# Patient Record
Sex: Male | Born: 1955 | Race: White | Hispanic: No | Marital: Single | Smoking: Never smoker
Health system: Southern US, Community
[De-identification: ages and names within clinical notes are randomized; demographics above are authoritative.]

## PROBLEM LIST (undated history)

## (undated) DIAGNOSIS — N390 Urinary tract infection, site not specified: Secondary | ICD-10-CM

## (undated) DIAGNOSIS — T7840XA Allergy, unspecified, initial encounter: Secondary | ICD-10-CM

## (undated) DIAGNOSIS — Q909 Down syndrome, unspecified: Secondary | ICD-10-CM

## (undated) DIAGNOSIS — K219 Gastro-esophageal reflux disease without esophagitis: Secondary | ICD-10-CM

## (undated) DIAGNOSIS — F0390 Unspecified dementia without behavioral disturbance: Secondary | ICD-10-CM

## (undated) DIAGNOSIS — M810 Age-related osteoporosis without current pathological fracture: Secondary | ICD-10-CM

## (undated) DIAGNOSIS — R569 Unspecified convulsions: Secondary | ICD-10-CM

## (undated) DIAGNOSIS — M1990 Unspecified osteoarthritis, unspecified site: Secondary | ICD-10-CM

## (undated) DIAGNOSIS — R634 Abnormal weight loss: Secondary | ICD-10-CM

## (undated) DIAGNOSIS — D649 Anemia, unspecified: Secondary | ICD-10-CM

## (undated) HISTORY — DX: Urinary tract infection, site not specified: N39.0

## (undated) HISTORY — DX: Unspecified osteoarthritis, unspecified site: M19.90

## (undated) HISTORY — PX: HERNIA REPAIR: SHX51

## (undated) HISTORY — DX: Allergy, unspecified, initial encounter: T78.40XA

## (undated) HISTORY — DX: Unspecified convulsions: R56.9

## (undated) HISTORY — PX: CARPAL TUNNEL RELEASE: SHX101

## (undated) HISTORY — DX: Abnormal weight loss: R63.4

## (undated) HISTORY — DX: Anemia, unspecified: D64.9

---

## 2005-05-17 ENCOUNTER — Ambulatory Visit (HOSPITAL_BASED_OUTPATIENT_CLINIC_OR_DEPARTMENT_OTHER): Admission: RE | Admit: 2005-05-17 | Discharge: 2005-05-17 | Payer: Self-pay | Admitting: Orthopedic Surgery

## 2005-05-17 ENCOUNTER — Ambulatory Visit (HOSPITAL_COMMUNITY): Admission: RE | Admit: 2005-05-17 | Discharge: 2005-05-17 | Payer: Self-pay | Admitting: Orthopedic Surgery

## 2006-11-22 ENCOUNTER — Inpatient Hospital Stay: Payer: Self-pay | Admitting: Internal Medicine

## 2006-11-24 ENCOUNTER — Ambulatory Visit: Payer: Self-pay | Admitting: Internal Medicine

## 2006-12-10 ENCOUNTER — Ambulatory Visit: Payer: Self-pay | Admitting: Internal Medicine

## 2007-03-04 ENCOUNTER — Ambulatory Visit: Payer: Self-pay | Admitting: Emergency Medicine

## 2007-03-14 ENCOUNTER — Emergency Department: Payer: Self-pay | Admitting: Emergency Medicine

## 2007-03-14 ENCOUNTER — Other Ambulatory Visit: Payer: Self-pay

## 2007-03-21 ENCOUNTER — Emergency Department: Payer: Self-pay | Admitting: Emergency Medicine

## 2008-05-08 ENCOUNTER — Emergency Department: Payer: Self-pay | Admitting: Emergency Medicine

## 2010-04-29 DIAGNOSIS — M810 Age-related osteoporosis without current pathological fracture: Secondary | ICD-10-CM | POA: Insufficient documentation

## 2011-04-26 DIAGNOSIS — B351 Tinea unguium: Secondary | ICD-10-CM | POA: Insufficient documentation

## 2011-04-26 DIAGNOSIS — L84 Corns and callosities: Secondary | ICD-10-CM | POA: Insufficient documentation

## 2011-06-10 ENCOUNTER — Ambulatory Visit: Payer: Self-pay | Admitting: Internal Medicine

## 2011-06-17 ENCOUNTER — Ambulatory Visit: Payer: Self-pay

## 2011-06-18 ENCOUNTER — Ambulatory Visit: Payer: Self-pay

## 2011-06-18 ENCOUNTER — Emergency Department: Payer: Self-pay | Admitting: Emergency Medicine

## 2011-06-21 DIAGNOSIS — L02419 Cutaneous abscess of limb, unspecified: Secondary | ICD-10-CM | POA: Insufficient documentation

## 2012-02-02 ENCOUNTER — Inpatient Hospital Stay: Payer: Self-pay | Admitting: Internal Medicine

## 2012-02-02 LAB — CBC WITH DIFFERENTIAL/PLATELET
Basophil #: 0.1 10*3/uL (ref 0.0–0.1)
Basophil %: 1.5 %
Eosinophil #: 0.1 10*3/uL (ref 0.0–0.7)
HCT: 39.7 % — ABNORMAL LOW (ref 40.0–52.0)
HGB: 13.3 g/dL (ref 13.0–18.0)
Lymphocyte %: 12.5 %
MCH: 34.8 pg — ABNORMAL HIGH (ref 26.0–34.0)
MCHC: 33.6 g/dL (ref 32.0–36.0)
Monocyte #: 0.4 x10 3/mm (ref 0.2–1.0)
Monocyte %: 6.6 %
Neutrophil #: 4.8 10*3/uL (ref 1.4–6.5)
Neutrophil %: 77.9 %
RBC: 3.83 10*6/uL — ABNORMAL LOW (ref 4.40–5.90)
RDW: 14.6 % — ABNORMAL HIGH (ref 11.5–14.5)
WBC: 6.1 10*3/uL (ref 3.8–10.6)

## 2012-02-02 LAB — COMPREHENSIVE METABOLIC PANEL
Albumin: 2.8 g/dL — ABNORMAL LOW (ref 3.4–5.0)
Alkaline Phosphatase: 78 U/L (ref 50–136)
Anion Gap: 10 (ref 7–16)
BUN: 11 mg/dL (ref 7–18)
Calcium, Total: 8.1 mg/dL — ABNORMAL LOW (ref 8.5–10.1)
Creatinine: 0.9 mg/dL (ref 0.60–1.30)
Glucose: 101 mg/dL — ABNORMAL HIGH (ref 65–99)
Potassium: 3.8 mmol/L (ref 3.5–5.1)
SGOT(AST): 30 U/L (ref 15–37)
Total Protein: 6.6 g/dL (ref 6.4–8.2)

## 2012-02-02 LAB — CK TOTAL AND CKMB (NOT AT ARMC)
CK, Total: 101 U/L (ref 35–232)
CK, Total: 45 U/L (ref 35–232)

## 2012-02-02 LAB — URINALYSIS, COMPLETE
Bilirubin,UR: NEGATIVE
Blood: NEGATIVE
Nitrite: NEGATIVE
Ph: 8 (ref 4.5–8.0)
Specific Gravity: 1.016 (ref 1.003–1.030)
Squamous Epithelial: NONE SEEN

## 2012-02-02 LAB — TROPONIN I: Troponin-I: <0.02 ng/mL

## 2012-02-02 LAB — TSH: Thyroid Stimulating Horm: 5.14 u[IU]/mL — ABNORMAL HIGH

## 2012-02-02 LAB — ETHANOL
Ethanol %: <0.003 % (ref 0.000–0.080)
Ethanol: <3 mg/dL

## 2012-02-03 LAB — CBC WITH DIFFERENTIAL/PLATELET
Basophil #: 0.1 10*3/uL (ref 0.0–0.1)
Basophil %: 0.6 %
Eosinophil #: 0 10*3/uL (ref 0.0–0.7)
Eosinophil %: 0 %
MCH: 35.4 pg — ABNORMAL HIGH (ref 26.0–34.0)
MCV: 102 fL — ABNORMAL HIGH (ref 80–100)
Monocyte #: 0.9 x10 3/mm (ref 0.2–1.0)
Neutrophil %: 84.9 %
Platelet: 284 10*3/uL (ref 150–440)
RBC: 4.04 10*6/uL — ABNORMAL LOW (ref 4.40–5.90)
RDW: 14.5 % (ref 11.5–14.5)

## 2012-02-03 LAB — COMPREHENSIVE METABOLIC PANEL
Albumin: 2.7 g/dL — ABNORMAL LOW (ref 3.4–5.0)
Alkaline Phosphatase: 95 U/L (ref 50–136)
BUN: 15 mg/dL (ref 7–18)
Bilirubin,Total: 0.4 mg/dL (ref 0.2–1.0)
Calcium, Total: 7.5 mg/dL — ABNORMAL LOW (ref 8.5–10.1)
Co2: 19 mmol/L — ABNORMAL LOW (ref 21–32)
Creatinine: 0.81 mg/dL (ref 0.60–1.30)
EGFR (Non-African Amer.): >60
SGOT(AST): 36 U/L (ref 15–37)
SGPT (ALT): 24 U/L

## 2012-02-03 LAB — CARBAMAZEPINE LEVEL, TOTAL: Carbamazepine: 6 ug/mL

## 2012-02-03 LAB — TROPONIN I: Troponin-I: 0.03 ng/mL

## 2012-02-03 LAB — MAGNESIUM: Magnesium: 1.7 mg/dL — ABNORMAL LOW

## 2012-02-03 LAB — URINE CULTURE

## 2012-02-04 LAB — BASIC METABOLIC PANEL
Anion Gap: 10 (ref 7–16)
Calcium, Total: 7.5 mg/dL — ABNORMAL LOW (ref 8.5–10.1)
Co2: 16 mmol/L — ABNORMAL LOW (ref 21–32)
Creatinine: 0.66 mg/dL (ref 0.60–1.30)
EGFR (African American): >60
Glucose: 112 mg/dL — ABNORMAL HIGH (ref 65–99)
Osmolality: 278 (ref 275–301)
Sodium: 139 mmol/L (ref 136–145)

## 2012-02-04 LAB — CBC WITH DIFFERENTIAL/PLATELET
Basophil #: 0.1 10*3/uL (ref 0.0–0.1)
Basophil %: 0.7 %
Eosinophil %: 0.2 %
Lymphocyte #: 0.9 10*3/uL — ABNORMAL LOW (ref 1.0–3.6)
Lymphocyte %: 9.1 %
MCH: 35.3 pg — ABNORMAL HIGH (ref 26.0–34.0)
MCV: 101 fL — ABNORMAL HIGH (ref 80–100)
Monocyte #: 0.7 x10 3/mm (ref 0.2–1.0)
Neutrophil %: 82.6 %
Platelet: 240 10*3/uL (ref 150–440)
RBC: 3.73 10*6/uL — ABNORMAL LOW (ref 4.40–5.90)
RDW: 14.1 % (ref 11.5–14.5)
WBC: 9.4 10*3/uL (ref 3.8–10.6)

## 2012-02-08 LAB — CULTURE, BLOOD (SINGLE)

## 2012-05-31 DIAGNOSIS — R1310 Dysphagia, unspecified: Secondary | ICD-10-CM | POA: Insufficient documentation

## 2012-11-13 LAB — COMPREHENSIVE METABOLIC PANEL
Anion Gap: 5 — ABNORMAL LOW (ref 7–16)
BUN: 15 mg/dL (ref 7–18)
Bilirubin,Total: 0.4 mg/dL (ref 0.2–1.0)
Calcium, Total: 8.7 mg/dL (ref 8.5–10.1)
Chloride: 103 mmol/L (ref 98–107)
Co2: 29 mmol/L (ref 21–32)
Creatinine: 0.86 mg/dL (ref 0.60–1.30)
EGFR (African American): >60
EGFR (Non-African Amer.): >60
Osmolality: 277 (ref 275–301)
Potassium: 3.9 mmol/L (ref 3.5–5.1)
SGPT (ALT): 19 U/L (ref 12–78)
Total Protein: 7.2 g/dL (ref 6.4–8.2)

## 2012-11-13 LAB — TROPONIN I: Troponin-I: <0.02 ng/mL

## 2012-11-13 LAB — CBC WITH DIFFERENTIAL/PLATELET
Basophil #: 0 x10 3/mm 3
Basophil %: 0.1 %
Eosinophil #: 0 x10 3/mm 3
Eosinophil %: 0.3 %
HCT: 40.7 %
HGB: 14 g/dL
Lymphocyte %: 2.7 %
Lymphs Abs: 0.2 x10 3/mm 3 — ABNORMAL LOW
MCH: 34.1 pg — ABNORMAL HIGH
MCHC: 34.3 g/dL
MCV: 100 fL
Monocyte #: 0.1 "x10 3/mm " — ABNORMAL LOW
Monocyte %: 1.5 %
Neutrophil #: 7.9 x10 3/mm 3 — ABNORMAL HIGH
Neutrophil %: 95.4 %
Platelet: 242 x10 3/mm 3
RBC: 4.09 X10 6/mm 3
RDW: 14.1 %
WBC: 8.2 x10 3/mm 3

## 2012-11-13 LAB — CK TOTAL AND CKMB (NOT AT ARMC)
CK, Total: 142 U/L (ref 21–215)
CK-MB: 2 ng/mL (ref 0.5–3.6)

## 2012-11-13 LAB — PROTIME-INR: INR: 1

## 2012-11-14 ENCOUNTER — Observation Stay: Payer: Self-pay | Admitting: Internal Medicine

## 2012-11-14 LAB — CK TOTAL AND CKMB (NOT AT ARMC)
CK, Total: 229 U/L — ABNORMAL HIGH (ref 21–215)
CK-MB: 2.9 ng/mL (ref 0.5–3.6)
CK-MB: 2.4 ng/mL (ref 0.5–3.6)

## 2012-11-14 LAB — URINALYSIS, COMPLETE
Bacteria: NONE SEEN
Bilirubin,UR: NEGATIVE
Blood: NEGATIVE
Glucose,UR: NEGATIVE mg/dL (ref 0–75)
Nitrite: NEGATIVE
Ph: 6 (ref 4.5–8.0)
Protein: NEGATIVE
RBC,UR: <1 /HPF (ref 0–5)
Specific Gravity: 1.018 (ref 1.003–1.030)
Squamous Epithelial: NONE SEEN

## 2012-11-14 LAB — TROPONIN I: Troponin-I: 0.02 ng/mL

## 2012-11-15 LAB — URINE CULTURE

## 2013-01-28 DIAGNOSIS — K219 Gastro-esophageal reflux disease without esophagitis: Secondary | ICD-10-CM | POA: Insufficient documentation

## 2013-02-28 ENCOUNTER — Ambulatory Visit: Payer: Self-pay | Admitting: Family Medicine

## 2013-02-28 ENCOUNTER — Emergency Department: Payer: Self-pay | Admitting: Emergency Medicine

## 2013-03-05 DIAGNOSIS — S0292XA Unspecified fracture of facial bones, initial encounter for closed fracture: Secondary | ICD-10-CM | POA: Insufficient documentation

## 2013-11-28 DIAGNOSIS — L03113 Cellulitis of right upper limb: Secondary | ICD-10-CM | POA: Insufficient documentation

## 2013-12-18 DIAGNOSIS — K4090 Unilateral inguinal hernia, without obstruction or gangrene, not specified as recurrent: Secondary | ICD-10-CM | POA: Insufficient documentation

## 2014-02-16 DIAGNOSIS — K920 Hematemesis: Secondary | ICD-10-CM | POA: Insufficient documentation

## 2014-03-10 DIAGNOSIS — K2100 Gastro-esophageal reflux disease with esophagitis, without bleeding: Secondary | ICD-10-CM | POA: Insufficient documentation

## 2014-05-10 ENCOUNTER — Emergency Department: Payer: Self-pay | Admitting: Emergency Medicine

## 2014-10-28 NOTE — Discharge Summary (Signed)
PATIENT NAME:  Jesus FlackANCE, TERRY M MR#:  [id number] DATE OF BIRTH:  11/01/1955  DATE OF ADMISSION:  02/02/2012 DATE OF DISCHARGE:  02/05/2012  DISCHARGE DIAGNOSES: 1. Beta blocker overdose with bradycardia and hypotension, resolved.  2. Accidental ingestion of multiple medications including Tegretol, Topamax, Lamictal, oxybutynin, Zoloft, and Risperdal.    DISCHARGE MEDICATIONS:  1. Artificial Tears one drop in each eye 4 times daily.  2. Aspirin 81 mg daily. 3. Peridex mouthwash 15 mL b.i.d.  4. Monopril 20 mg p.o. b.i.d.  5. Os-Cal with vitamin D 2 times a day.  6. Claritin 10 mg daily. 7. Lamisil 250 mg daily for 12 weeks.  8. Penicillin V 250 mg, 5 mL 2 times a day. That is used for bilateral chronic cellulitis. 9. Gentamicin eye ointment to lower lids that he should use at night.  [facility]  HOSPITAL COURSE:  1. The patient is a 59 year old male with Down's syndrome from a group home, brought in because the patient accidentally ingested Risperdal, Zoloft, omeprazole, metoprolol, Tegretol, Lamictal, Topamax, oxybutynin, and Flomax.  The patient accidentally ingested other patient's medications that were kept at the nurses' station. The patient was found to have bradycardia with hypotension. Heart rate was in the 40s and systolic blood pressure was 80. The patient also was very sedated because of the medications. EKG showed PR of 136, QTc 403, and sinus bradycardia. The patient was admitted to the Intensive Care Unit for beta blocker overdose with Toprol-XL. Poison Control Center was called. He was started on glucagon and dopamine drip. The patient was on glucagon and dopamine drip from July 25 to July 27 in the morning.  Initially glucagon was continued and dopamine was started after the glucagon was stopped, then dopamine continued.  When I saw the patient on 07/26, heart rate still was low and blood pressure was low.  [facility] and they  recommended restarting the glucagon and tapering off the dopamine, so we restarted the glucagon drip on 02/03/2012 and then continued to the morning of 07/27.  He was off both drips on the morning of 07/27.  After watching him in the Intensive Care Unit without any drips for about six hours, the patient's blood pressure was more than 100/60 and heart rate stayed around 70. So we moved him out of the Intensive Care Unit and monitored him on the floor with off-unit telemetry. The patient did stay stable and mental status also improved. The patient's blood pressure at the time of discharge is 127/73, pulse 90. The patient did not require any further glucagon or dopamine drip.  2. The patient's mental status also improved as the drug effects of his overdose medications Zoloft and Risperdal were out of his system.   3. Dysphagia. The patient has been having trouble swallowing with coughing episodes, so speech therapy followed the patient and recommended puree diet with thin liquids.  The patient had a barium swallow which showed swallowing appeared normal and esophageal motility was normal. The patient had a hiatal hernia. No ulcers. Conclusion was hiatal hernia associated with small esophageal narrowing just above the hernia.  Patient was able to tolerate mechanical soft diet with thin liquids. He did finish 100% of his lunch and breakfast. He needed supervision. If he has worsening of his swallow he may need repeat barium swallow and also probably GI consult, but during this admission he was able to tolerate without any signs of aspiration. Physical therapy also saw the  patient before discharge. He was able to walk with a rolling walker with supervision up to 300 feet with good body placement and posture. He was referred back to his group home.  He was discharged back to the [facility] in [place].   CONDITION AT THE TIME OF DISCHARGE: Stable.   TIME SPENT: More than 30 minutes.     ____________________________ Katha Hamming, MD sk:bjt D: 02/06/2012 15:26:29 ET T: 02/07/2012 13:31:29 ET JOB#: [id number]  cc: Katha Hamming, MD, <Dictator> Katha Hamming MD ELECTRONICALLY SIGNED 02/15/2012 14:10

## 2014-10-28 NOTE — H&P (Signed)
PATIENT NAME:  Jesus Morton, Jesus Morton MR#:  [id number] DATE OF BIRTH:  07-16-1955  DATE OF ADMISSION:  02/02/2012  PRIMARY CARE PHYSICIAN: Cammy Copa, MD  ER REFERRING PHYSICIAN: Maricela Bo, MD  CHIEF COMPLAINT: Injection of unknown medications and altered mental status.   HISTORY OF PRESENTING ILLNESS: The patient is a 59 year old male patient who is a resident of a group nursing home with history of Down's syndrome and hyperlipidemia who presented to the Emergency Room sent in through EMS after he had an accidental ingestion of another group home resident's medications. He took Risperdal, Zoloft, omeprazole, Colace, metoprolol, Tegretol-XR, Lamictal, Topamax, oxybutynin, and Flomax. The patient has been drowsy since. On presentation to the Emergency Room, his heart rate has been in the low 40s with hypotension into a systolic blood pressure of 80s. The patient does wake up on calling his name. His caregivers from the group home are at the bedside who mention that at baseline the patient is awake, alert, ambulates on his own, and is oriented to place but not time. The patient wakes up on calling his name, but is not able to contribute much to the history, denies any concerns. He did not have any recent change in medications.   The patient's case has been discussed with Poison Control and is presently started on glucagon drip. Heart rate is still at 45 with blood pressure of 83/60.   EKG shows normal sinus rhythm with PR of 136, QTc of 403, and sinus bradycardia with no acute ST-T wave changes.   Chest x-ray, two view, shows no acute cardiopulmonary disease.   PAST MEDICAL HISTORY:  1. Down's syndrome. 2. Hyperlipidemia. 3. Cellulitis in the past.   FAMILY HISTORY: Reviewed, unknown.   ALLERGIES: No known drug allergies.   SOCIAL HISTORY: The patient lives in a group home, ambulates on his own, is awake and alert at baseline, and oriented to person and place. He does not smoke, drink alcohol,  or use illicit drugs.   HOME MEDICATIONS:  1. Artificial Tears one drop in each eye four times a day.  2. Aspirin 81 mg oral once a day.  3. Cetaphil topical lotion apply to affected area twice a day.  4. Claritin 10 mg oral once a day.  5. Eucerin topical cream as needed.  6. Lamisil 250 mg oral tablet once a day.  7. Omeprazole 20 mg oral twice a day. 8. Os-Cal 500 with vitamin D one tablet oral twice a day.  9. Penicillin-V 5 mL orally twice a day. 10. Multivitamin one tablet oral once a day.  11. Vaseline topical ointment as needed.   REVIEW OF SYSTEMS: Unobtainable.   PHYSICAL EXAMINATION:   VITAL SIGNS: Temperature 97.4, heart rate 45, respirations 18, saturating 98% on room air, and blood pressure 83/60.   PHYSICAL EXAMINATION: Moderately built male patient with Down's facies lying comfortably in bed in no acute distress, opens eyes on calling his name.   PSYCHIATRIC: Difficult to assess, but the patient is calm and composed.   HEENT: Atraumatic, normocephalic. Oral mucosa moist and pink. External ears and nose normal. No pallor. No icterus. Pupils are bilaterally equal and reactive to light. Oral mucosa dry.   NECK: Supple. No thyromegaly. No palpable lymph nodes. Trachea midline. No carotid bruit or JVD.   CARDIOVASCULAR: S1 and S2 with faint sounds, bradycardic. Peripheral pulses 2+. No edema or JVD.   RESPIRATORY: Normal work of breathing. Clear to auscultation on both sides.   GASTROINTESTINAL: Soft abdomen, nontender.  Bowel sounds present. No hepatosplenomegaly palpable.   SKIN: Warm and dry. No petechiae, rash, or ulcers.  NEURO: Moves all four extremities equally, normal tone.  LABORATORY, DIAGNOSTIC AND RADIOLOGIC DATA: Glucose 165, BUN 11, creatinine 0.90, sodium 138, potassium 3.8, calcium 8.1, albumin 2.8, AST, ALT, and alkaline phosphatase normal. Hemoglobin 13.3, WBC 6.1, and platelets 262.    Chest x-ray, two view: No acute abnormalities.   EKG shows  normal sinus rhythm with heart rate of 45 with QTc of 403 and PR interval 136. No acute ST-T wave changes.   ASSESSMENT AND PLAN:  1. Bradycardia and hypotension likely secondary to medications which were accidentally given to the patient. This is likely from the beta blocker the patient has received. We will aggressively fluid resuscitate the patient along with continuing with Glucagon drip. The patient will be admitted to the Critical Care Unit. We will get cardiac enzymes to rule out any acute coronary events at this time. The patient does not need a pacemaker at this time, but if his bradycardia does not improve or worsens we will consult cardiology for a temporary pacemaker. The patient will be on Accu-Cheks every 1 hour.  2. Polydrug overdose. Closely monitor in the Critical Care Unit. We will get tricyclic levels and acetaminophen levels and also get a CT scan of the head.  3. Acute encephalopathy secondary to medications.  4. Deep vein thrombosis prophylaxis. We will put the patient on TEDs. We will start on Lovenox once CT of the head shows no bleed.   CODE STATUS: FULL CODE.     TIME SPENT: Time spent today on this case with symptomatic bradycardia, hypotension, and being admitted to Critical Care Unit with overdose was 60 minutes. ____________________________ Molinda BailiffSrikar R. Seymour Pavlak, MD srs:slb D: 02/02/2012 13:51:30 ET T: 02/02/2012 14:16:47 ET JOB#: [id number]  cc: Wardell HeathSrikar R. Donnovan Stamour, MD, <Dictator> Cammy CopaAnnie Mounsey, MD Orie FishermanSRIKAR R River Ambrosio MD ELECTRONICALLY SIGNED 02/08/2012 13:48

## 2014-10-31 NOTE — Consult Note (Signed)
PATIENT NAME:  Jesus Morton, Jesus Morton MR#:  [id number] DATE OF BIRTH:  [birth year]  DATE OF CONSULTATION:  11/14/2012  REFERRING PHYSICIAN:  Hope PigeonVaibhavkumar G. Elisabeth PigeonVachhani, MD CONSULTING PHYSICIAN:  Jesus MillardAlexander Lucile Hillmann, MD PRIMARY CARE PHYSICIAN:  Pilar PlateAnne Munsey, MD    CHIEF COMPLAINT: Chest discomfort.   REASON FOR CONSULTATION: Consultation requested for evaluation of chest pain.   HISTORY OF PRESENT ILLNESS: The patient is a 59 year old gentleman with Down syndrome who was admitted on 11/14/2012 with chest pain. The patient apparently experienced substernal chest pain described as a burning sensation after his evening meal. This was associated with generalized trembling and pushing his hand against his chest. [facility] where EKG showed normal sinus rhythm, normal ECG. The patient was admitted to telemetry where he has ruled out for myocardial infarction by CPK isoenzymes and troponin. The patient has had no recurrent chest pain. He currently denies chest pain.   PAST MEDICAL HISTORY: 1.  Down syndrome.  2.  Hyperlipidemia.  3.  Esophageal stricture. 4.  Peripheral vascular disease.   MEDICATIONS: Aspirin 81 mg daily, calcium with vitamin D 500 mg b.i.d., Benadryl 25 mg daily, ibuprofen 800 mg t.i.d., Claritin 10 mg daily.   SOCIAL HISTORY: The patient currently lives in a group home. He denies tobacco or EtOH abuse.   FAMILY HISTORY: No immediate family history for coronary artery disease or myocardial infarction.   REVIEW OF SYSTEMS: Obtained from the patient and the patient's sister.  CONSTITUTIONAL: No fever or chills.  EYES: No blurry vision.  EARS: No hearing loss.  RESPIRATORY: No shortness of breath.  CARDIOVASCULAR: Chest discomfort as described above.  GASTROINTESTINAL: No nausea, vomiting or diarrhea.  GU: No dysuria or hematuria.  ENDOCRINE: No polyuria or polydipsia.  INTEGUMENTARY: No rash.  HEMATOLOGICAL: No easy bruising or bleeding.   MUSCULOSKELETAL: No arthralgias or myalgias.  NEUROLOGICAL: No focal muscle weakness or numbness. The patient does have a history of Down syndrome.  PSYCHIATRIC: No history of depression or anxiety.   PHYSICAL EXAMINATION: VITAL SIGNS: Blood pressure 136/87, pulse 61, respirations 18, temperature 98.5, pulse ox 99%.  HEENT: Pupils equal, reactive to light and accommodation.  NECK: Supple without thyromegaly.  LUNGS: Clear.  HEART: Normal JVP. Normal PMI. Regular rate and rhythm. Normal S1, S2. Grade 1 to 2/6 systolic murmur.  ABDOMEN: Soft and nontender. Pulses were intact bilaterally.  MUSCULOSKELETAL: Normal muscle tone.  NEUROLOGICAL: The patient is alert and oriented. The patient has Down syndrome.   IMPRESSION: A 59 year old gentleman with history Down syndrome who presents with chest pain with atypical features. EKG was normal. The patient has ruled out for myocardial infarction by CPK isoenzymes and troponin.   RECOMMENDATIONS: 1.  I agree with overall current therapy.  2.  Would defer further cardiac work-up at this time. May consider GI work-up for known history of esophageal stricture.        3.  Would defer stress perfusion test or cardiac catheterization.  ____________________________ Jesus MillardAlexander Chrisa Hassan, MD ap:cb D: 11/14/2012 16:28:30 ET T: 11/14/2012 16:36:39 ET JOB#: [id number]  cc: Jesus MillardAlexander Jasiel Belisle, MD, <Dictator> Jesus MillardALEXANDER Jahaziel Francois MD ELECTRONICALLY SIGNED 11/19/2012 13:48

## 2014-10-31 NOTE — Discharge Summary (Signed)
PATIENT NAME:  Jesus Morton, Jesus Morton MR#:  [id number] DATE OF BIRTH:  1956-04-25  DATE OF ADMISSION:  11/14/2012 DATE OF DISCHARGE:  11/14/2012  FINAL DIAGNOSES:  1.  Chest pain, epigastric pain, possibly due to indigestion.  2.  Hyperlipidemia.  3.  Down syndrome.   CONDITION ON DISCHARGE:  Stable.   CODE STATUS:  FULL CODE.   DISCHARGE MEDICATIONS:  1.  Omeprazole 20 mg delayed-release capsule 2 times a day.  2.  Claritin 10 mg oral tablet once a day.  3.  Penicillin V 250 mg/5 mL oral liquid 5 mL orally 2 times a day. 3.  Aspirin 81 mg oral tablet once a day. 4.  Calcium and vitamin D tablet 2 times a day. 5.  Ibuprofen 800 mg 3 times a day as needed for severe or moderate knee and back pain. 6.  Saline Nasal Spray 1 to 2 sprays at every 3 hours while awake. 7.  Systane Ophthalmic Gel one drop each eye every 2 hours when awake.  8.  Benadryl 25 mg oral tablet once a day as needed for sleep. 9.  Mupirocin topical 2% ointment hands and extremities 3 times a day as needed for skin problem.  DIET ON DISCHARGE:  Regular.   DIET CONSISTENCY:  Regular.   ACTIVITY:  As tolerated.   TIMEFRAME TO FOLLOWUP:  Within 2 to 4 weeks as routine with primary care physician, Dr. Denny Levy.   HISTORY OF PRESENTING ILLNESS:  This is a 59 year old Caucasian male with the past medical history of Down syndrome, peripheral vascular disease, esophageal stricture, hyperlipidemia, complaining of midsternal chest pain as well as epigastric and abdominal pain around 6 in the evening the previous day. Until then he was in usual state of health. He finished his dinner. While trying to clean up his plate, he suddenly started trembling and started placing his hand against mid sternal area of the chest, pushing  against the chest, complaining of stomach pain. They checked his blood pressure, seemed to be elevated. The caregiver from the group home reporting that the reading might not be accurate as the patient was  trembling, no shortness of breath. They called EMS and he was brought to the Emergency Room. When he came to the ER, his chest pain was significantly improved. No complaint of chest pain or epigastric pain. A 12-lead EKG did not show any changes. Chest x-ray was normal. A right upper quadrant sonogram did not show any acute finding.   HOSPITAL COURSE AND STAY:  1. Chest pain, most likely due to indigestion. Troponins were negative. His symptoms remained under control. No pain in the chest. Cardiology consult was done with Dr. Darrold Junker. He suggested no cardiac workup and agreed with the current management. The patient, on further questioning with a caregiver, had had some fish and [place] fries the previous night in dinner and since then he had some indigestion and loose stool. May be all this episode was related to that.  OTHER ISSUES:  1.  Epigastric abdominal pain. The patient had complaint of esophageal stricture, but there was no vomiting and possible indigestion as mentioned above remained under control.  2.  Hyperlipidemia. We continued statin.  3.  Down syndrome. Stable.  4.  Cerebrovascular disease. Stable.   IMPORTANT LABORATORY RESULTS IN THE HOSPITAL:  Creatinine 0.86, glucose 132. Troponin all 3 were negative in the hospital. CBC within normal range. INR 1.0.   Urinalysis:  Negative. Urine culture:  No growth.   Sonogram abdomen:  No sonographic evidence of cholecystitis . Chest x-ray, portable. No acute cardiopulmonary disease.   TOTAL TIME SPENT ON THIS DISCHARGE:  45 minutes.  ____________________________ Hope PigeonVaibhavkumar G. Elisabeth PigeonVachhani, MD vgv:jm D: 11/16/2012 20:36:17 ET T: 11/17/2012 14:38:15 ET JOB#: [id number]  cc: Hope PigeonVaibhavkumar G. Elisabeth PigeonVachhani, MD, <Dictator> Denny LevyAnne Mounsey, MD Altamese DillingVAIBHAVKUMAR Demorris Choyce MD ELECTRONICALLY SIGNED 11/22/2012 6:19

## 2014-10-31 NOTE — H&P (Signed)
PATIENT NAME:  DIANE, MOCHIZUKI MR#:  [id number] DATE OF BIRTH:  22-Dec-1955  DATE OF ADMISSION:  11/14/2012  PRIMARY CARE PHYSICIAN:  Dr. Denny Levy.  REFERRING PHYSICIAN:  Dr. Manson Passey.   CHIEF COMPLAINT:  Chest and epigastric pain.   HISTORY OF PRESENT ILLNESS:  The patient is a 59 year old Caucasian male with past medical history of Down's syndrome, peripheral vascular disease, esophageal stricture and hyperlipidemia, was complaining of midsternal chest pain as well as epigastric abdominal pain at around 6:00 p.m. yesterday.  The patient was in his usual state of health until yesterday evening.  After he finished his dinner while he was trying to clean up his plate he suddenly started trembling and started pressing his hands against midsternal area of the chest.  He was pushing his hand against the chest and complaining of stomach pain.  At the time they tried to check his blood pressure which seemed to be elevated, but his caregivers from the group home was reporting that that reading might not be accurate as patient was trembling.  No shortness of breath.  They called EMS and patient was brought into the ER.  By the time EMS came there patient's chest pain was significantly improved.  After coming here to the ER patient was not complaining of chest pain or epigastric abdominal pain according to the caregivers from group home.  During my examination, the patient was complaining of a little bit of chest pain in the epigastric area.  A 12-lead EKG did not show any changes in the ER.  Chest x-ray was normal and right upper quadrant ultrasound did not reveal any acute findings according to the ER physician.  The patient's initial temperature was 100.6 degrees Fahrenheit, regarding which urinalysis is ordered which is still pending at this time.  The patient is in deep sleep as I examined the patient around 3:15 a.m. in the early hours.  He was arousable, complaining of chest pain and then as he is tired he fell  asleep again.  The patient denies any other complaints.  Denies any nausea, vomiting, or diarrhea.  According to the caretakers he takes care of himself, oriented to time, place and person at his baseline.   PAST MEDICAL HISTORY:  Down's syndrome, peripheral vascular disease, osteoporosis, esophageal stricture, hyperlipidemia.   PAST SURGICAL HISTORY:  None.   ALLERGIES:  He has no known drug allergies.   HOME MEDICATIONS:  Calcium with vitamin D 500 mg 2 times a day, Benadryl 25 mg once a day, aspirin 81 mg once daily, ibuprofen 800 mg 3 times a day, Claritin 10 mg once daily, saline nasal spray 1 to 2 sprays to each nostril as needed.   PSYCHOSOCIAL HISTORY:  He lives in a group home.  No history of smoking, alcohol or illicit drug usage.  His brother is his medical POA.   FAMILY HISTORY:  Unknown.  His brother is not available bedside at this time.    REVIEW OF SYSTEMS:   The patient has a chronic history of Down's syndrome and at this time he is tired.  He is just reporting that he has epigastric chest pain intermittently and denies any.  CONSTITUTIONAL:  Denies any fever, fatigue, weakness.  No weight loss, weight gain.  EYES:  No blurry vision, glaucoma, cataracts.  EARS, NOSE, THROAT:  Denies any hearing loss, postnasal drip, sinus pain.  RESPIRATION:  No cough, shortness of breath, COPD.  CARDIOVASCULAR:  Complaining of epigastric chest pain, but no syncope, palpitations.  GASTROINTESTINAL:  No nausea, vomiting, diarrhea, but has history of esophageal stricture.  GENITOURINARY:  No history of prostatitis.  No hernia.  ENDOCRINE:  No polyuria, nocturia, thyroid problems.  HEMATOLOGIC AND LYMPHATIC:  No anemia, easy bruising or bleeding.  INTEGUMENTARY:  No acne, rash, lesions.  MUSCULOSKELETAL:  No joint pain in the neck, back, shoulders or gout.  NEUROLOGIC:  No vertigo, ataxia, dementia, has a chronic history of Down's syndrome.  PSYCHIATRIC:  No ADD, OCD, bipolar disorder.     PHYSICAL EXAMINATION: VITAL SIGNS:  Temperature 98.6, pulse 79, respirations 18, blood pressure 101/50, pulse ox 97%.  GENERAL APPEARANCE:  Not under acute distress.  Moderately built and moderately nourished with Down's features.  HEENT:  Normocephalic, atraumatic.  Pupils are equally reactive to light and accommodation.  No scleral icterus.  No conjunctival injection.  Extraocular movements are intact.  No sinus tenderness.  No postnasal drip.  NECK:  Supple.  No JVD.  No thyromegaly.  LUNGS:  Clear to auscultation.  No accessory muscle usage.  No anterior chest wall tenderness on palpation.  CARDIAC:  S1, S2 normal.  Regular rate and rhythm.  Positive murmur.  No gallops.  GASTROINTESTINAL:  Soft.  Bowel sounds are positive in all four quadrants.  Nontender, nondistended.  No masses felt.  No hepatosplenomegaly.  NEUROLOGIC:  Arousable, but very tired and answering questions and falling asleep usually as it was in the early hours, around 3:15 a.m. during my examination.  Following verbal commands.  Motor and sensory are grossly intact.  Reflexes are 2+.  SKIN:  Normal turgor.  No lesions.  No rashes.  Warm to touch.  MUSCULOSKELETAL:  No joint effusion, tenderness or erythema.  EXTREMITIES:  No edema.  No cyanosis.  No clubbing, but has chronic venous changes from peripheral vascular disease.   LABORATORY AND IMAGING STUDIES:  A 12-lead EKG, normal sinus rhythm at 75 per minute.  Normal PR and QRS interval.  No ST-T wave changes.  Chest x-ray, no acute findings.  Right upper quadrant ultrasound normal according to the ER physician's preliminary report.  CK total is 142, CPK-MB 2.0, troponin less than 0.02.  Second troponin is 0.04.  WBC 8.2, hemoglobin 14.0, hematocrit 40.7, platelet count 242, MCV is 100, PT 13.7, INR 1.0.  LFTs are within normal range except albumin which is low at 2.7, glucose 132, creatinine 0.86, BUN 15, potassium 3.9, sodium 137, GFR greater than 60, anion gap is 5, serum  osmolality 277, calcium 8.7.    ASSESSMENT AND PLAN:  A 59 year old Caucasian male with a history of Down's syndrome who lives in a group home.  He was brought into the ER with a chief complaint of chest pain and epigastric pain, will be admitted with the following assessment and plan.  1.  Chest pain, rule out acute myocardial infarction.  We will cycle cardiac biomarkers, monitor him on telemetry.  We will obtain echocardiogram.  Acute coronary syndrome protocol will be implemented.  Cardiology consult is placed to on-call cardiology, Dr. Juliann Paresallwood as the patient has history of Down's syndrome and complaining of chest pain.  The patient will be kept nothing by mouth except for meds and will be given IV fluids while nothing by mouth.  2.  Epigastric abdominal pain.  This can be from worsening of chronic esophageal stricture versus cholecystitis.  LFTs are within normal range.  Right upper quadrant ultrasound is also normal according to the preliminary findings.  We will continue close monitoring of the  patient.  We will provide him gastrointestinal prophylaxis.  If necessary, we will consider GI consult after ruling out acute myocardial infarction.  3.  History of hyperlipidemia.  Check fasting lipid panel.  4.  Chronic history of Down's syndrome, stable.  5.  Peripheral vascular disease.  6.  Urinalysis is currently pending.  The patient initially had a low-grade fever.  We will follow up on the urinalysis and urine culture.  If necessary we will add antibiotics for possible acute cystitis.   7.  We will provide him deep vein thrombosis prophylaxis.  8.  He is FULL CODE.  The patient's brother is the medical POA.   Total time spent on the admission is 50 minutes.   The diagnosis and plan of care was discussed in detail with the patient's caretakers at bedside.      ____________________________ Ramonita Lab, MD ag:ea D: 11/14/2012 03:45:11 ET T: 11/14/2012 04:52:22 ET JOB#: [id number]  cc: Ramonita Lab, MD, <Dictator> Dr. Natasha Bence MD ELECTRONICALLY SIGNED 11/19/2012 0:50

## 2015-12-24 DIAGNOSIS — L03113 Cellulitis of right upper limb: Secondary | ICD-10-CM | POA: Insufficient documentation

## 2016-06-20 ENCOUNTER — Observation Stay
Admission: EM | Admit: 2016-06-20 | Discharge: 2016-06-22 | Disposition: A | Discharge status: Home / Self Care | Payer: Medicare Other | Attending: Internal Medicine | Admitting: Internal Medicine

## 2016-06-20 ENCOUNTER — Emergency Department: Payer: Medicare Other

## 2016-06-20 ENCOUNTER — Encounter: Payer: Self-pay | Admitting: Emergency Medicine

## 2016-06-20 DIAGNOSIS — K209 Esophagitis, unspecified: Secondary | ICD-10-CM | POA: Insufficient documentation

## 2016-06-20 DIAGNOSIS — R059 Cough, unspecified: Secondary | ICD-10-CM

## 2016-06-20 DIAGNOSIS — I959 Hypotension, unspecified: Secondary | ICD-10-CM | POA: Diagnosis not present

## 2016-06-20 DIAGNOSIS — T18128A Food in esophagus causing other injury, initial encounter: Secondary | ICD-10-CM | POA: Diagnosis not present

## 2016-06-20 DIAGNOSIS — F0390 Unspecified dementia without behavioral disturbance: Secondary | ICD-10-CM | POA: Diagnosis not present

## 2016-06-20 DIAGNOSIS — R531 Weakness: Secondary | ICD-10-CM | POA: Diagnosis not present

## 2016-06-20 DIAGNOSIS — Q909 Down syndrome, unspecified: Secondary | ICD-10-CM | POA: Diagnosis not present

## 2016-06-20 DIAGNOSIS — R05 Cough: Secondary | ICD-10-CM

## 2016-06-20 DIAGNOSIS — J9601 Acute respiratory failure with hypoxia: Secondary | ICD-10-CM | POA: Insufficient documentation

## 2016-06-20 DIAGNOSIS — K219 Gastro-esophageal reflux disease without esophagitis: Secondary | ICD-10-CM | POA: Diagnosis not present

## 2016-06-20 DIAGNOSIS — R1310 Dysphagia, unspecified: Secondary | ICD-10-CM

## 2016-06-20 DIAGNOSIS — K208 Other esophagitis: Secondary | ICD-10-CM

## 2016-06-20 DIAGNOSIS — J690 Pneumonitis due to inhalation of food and vomit: Secondary | ICD-10-CM | POA: Diagnosis present

## 2016-06-20 DIAGNOSIS — K2091 Esophagitis, unspecified with bleeding: Secondary | ICD-10-CM

## 2016-06-20 DIAGNOSIS — F419 Anxiety disorder, unspecified: Secondary | ICD-10-CM | POA: Insufficient documentation

## 2016-06-20 DIAGNOSIS — K228 Other specified diseases of esophagus: Secondary | ICD-10-CM | POA: Diagnosis not present

## 2016-06-20 DIAGNOSIS — R198 Other specified symptoms and signs involving the digestive system and abdomen: Secondary | ICD-10-CM

## 2016-06-20 DIAGNOSIS — X58XXXA Exposure to other specified factors, initial encounter: Secondary | ICD-10-CM | POA: Diagnosis not present

## 2016-06-20 DIAGNOSIS — T17900A Unspecified foreign body in respiratory tract, part unspecified causing asphyxiation, initial encounter: Secondary | ICD-10-CM

## 2016-06-20 DIAGNOSIS — T17908A Unspecified foreign body in respiratory tract, part unspecified causing other injury, initial encounter: Secondary | ICD-10-CM

## 2016-06-20 DIAGNOSIS — T189XXA Foreign body of alimentary tract, part unspecified, initial encounter: Secondary | ICD-10-CM

## 2016-06-20 LAB — CBC WITH DIFFERENTIAL/PLATELET
BASOS PCT: 1 %
Basophils Absolute: 0.1 10*3/uL (ref 0–0.1)
Eosinophils Absolute: 0 10*3/uL (ref 0–0.7)
Eosinophils Relative: 0 %
HEMATOCRIT: 45.4 % (ref 40.0–52.0)
HEMOGLOBIN: 15.3 g/dL (ref 13.0–18.0)
LYMPHS ABS: 0.8 10*3/uL — AB (ref 1.0–3.6)
Lymphocytes Relative: 7 %
MCH: 34.1 pg — ABNORMAL HIGH (ref 26.0–34.0)
MCHC: 33.6 g/dL (ref 32.0–36.0)
MCV: 101.7 fL — ABNORMAL HIGH (ref 80.0–100.0)
MONOS PCT: 4 %
Monocytes Absolute: 0.4 10*3/uL (ref 0.2–1.0)
NEUTROS ABS: 10.1 10*3/uL — AB (ref 1.4–6.5)
Neutrophils Relative %: 88 %
Platelets: 267 10*3/uL (ref 150–440)
RBC: 4.47 MIL/uL (ref 4.40–5.90)
RDW: 14.5 % (ref 11.5–14.5)
WBC: 11.5 10*3/uL — AB (ref 3.8–10.6)

## 2016-06-20 LAB — BASIC METABOLIC PANEL
Anion gap: 8 (ref 5–15)
BUN: 14 mg/dL (ref 6–20)
CHLORIDE: 103 mmol/L (ref 101–111)
CO2: 27 mmol/L (ref 22–32)
CREATININE: 0.89 mg/dL (ref 0.61–1.24)
Calcium: 8.9 mg/dL (ref 8.9–10.3)
GFR calc non Af Amer: >60 mL/min (ref >60)
Glucose, Bld: 131 mg/dL — ABNORMAL HIGH (ref 65–99)
POTASSIUM: 4.2 mmol/L (ref 3.5–5.1)
SODIUM: 138 mmol/L (ref 135–145)

## 2016-06-20 LAB — INFLUENZA PANEL BY PCR (TYPE A & B)
INFLAPCR: NEGATIVE
INFLBPCR: NEGATIVE

## 2016-06-20 MED ORDER — CALCIUM CARBONATE-VITAMIN D 500-200 MG-UNIT PO TABS
1.0000 | ORAL_TABLET | Freq: Two times a day (BID) | ORAL | Status: DC
  Administered 2016-06-20: 1 via ORAL
  Filled 2016-06-20: qty 1

## 2016-06-20 MED ORDER — HYDROCODONE-ACETAMINOPHEN 5-325 MG PO TABS: 1.0000 | ORAL_TABLET | ORAL | Status: DC | PRN

## 2016-06-20 MED ORDER — MEMANTINE HCL 5 MG PO TABS: 10.0000 mg | ORAL_TABLET | Freq: Every day | ORAL | Status: DC

## 2016-06-20 MED ORDER — ZOLPIDEM TARTRATE 5 MG PO TABS: 5.0000 mg | ORAL_TABLET | Freq: Every evening | ORAL | Status: DC | PRN

## 2016-06-20 MED ORDER — SODIUM CHLORIDE 0.9 % IV SOLN
INTRAVENOUS | Status: DC
  Administered 2016-06-20 – 2016-06-21 (×2): via INTRAVENOUS

## 2016-06-20 MED ORDER — SENNOSIDES-DOCUSATE SODIUM 8.6-50 MG PO TABS: 1.0000 | ORAL_TABLET | Freq: Every evening | ORAL | Status: DC | PRN

## 2016-06-20 MED ORDER — PANTOPRAZOLE SODIUM 40 MG PO TBEC: 40.0000 mg | DELAYED_RELEASE_TABLET | Freq: Every day | ORAL | Status: DC

## 2016-06-20 MED ORDER — DONEPEZIL HCL 5 MG PO TABS: 5.0000 mg | ORAL_TABLET | Freq: Every day | ORAL | Status: DC

## 2016-06-20 MED ORDER — SALINE SPRAY 0.65 % NA SOLN
1.0000 | NASAL | Status: DC | PRN
  Filled 2016-06-20: qty 44

## 2016-06-20 MED ORDER — BISACODYL 5 MG PO TBEC: 5.0000 mg | DELAYED_RELEASE_TABLET | Freq: Every day | ORAL | Status: DC | PRN

## 2016-06-20 MED ORDER — MUPIROCIN 2 % EX OINT
1.0000 "application " | TOPICAL_OINTMENT | Freq: Two times a day (BID) | CUTANEOUS | Status: DC
  Administered 2016-06-21: 1 via NASAL
  Filled 2016-06-20: qty 22

## 2016-06-20 MED ORDER — SODIUM CHLORIDE 0.9 % IV SOLN
3.0000 g | Freq: Once | INTRAVENOUS | Status: AC
  Administered 2016-06-20: 3 g via INTRAVENOUS
  Filled 2016-06-20: qty 3

## 2016-06-20 MED ORDER — SODIUM CHLORIDE 0.9 % IV SOLN
3.0000 g | Freq: Four times a day (QID) | INTRAVENOUS | Status: DC
  Administered 2016-06-21 – 2016-06-22 (×6): 3 g via INTRAVENOUS
  Filled 2016-06-20 (×11): qty 3

## 2016-06-20 MED ORDER — ONDANSETRON HCL 4 MG/2ML IJ SOLN: 4.0000 mg | Freq: Four times a day (QID) | INTRAMUSCULAR | Status: DC | PRN

## 2016-06-20 MED ORDER — ACETAMINOPHEN 500 MG PO TABS: 500.0000 mg | ORAL_TABLET | Freq: Four times a day (QID) | ORAL | Status: DC | PRN

## 2016-06-20 MED ORDER — VITAMIN B-12 1000 MCG PO TABS: 1000.0000 ug | ORAL_TABLET | Freq: Every day | ORAL | Status: DC

## 2016-06-20 MED ORDER — RISAQUAD PO CAPS
1.0000 | ORAL_CAPSULE | Freq: Every day | ORAL | Status: DC
Start: 2016-06-21 — End: 2016-06-22

## 2016-06-20 MED ORDER — SUCRALFATE 1 GM/10ML PO SUSP
1.0000 g | Freq: Four times a day (QID) | ORAL | Status: DC
  Administered 2016-06-20: 1 g via ORAL
  Filled 2016-06-20: qty 10

## 2016-06-20 MED ORDER — LORATADINE 10 MG PO TABS: 10.0000 mg | ORAL_TABLET | Freq: Every day | ORAL | Status: DC

## 2016-06-20 MED ORDER — IPRATROPIUM-ALBUTEROL 0.5-2.5 (3) MG/3ML IN SOLN: 3.0000 mL | Freq: Four times a day (QID) | RESPIRATORY_TRACT | Status: DC | PRN

## 2016-06-20 MED ORDER — LORAZEPAM 0.5 MG PO TABS: 0.5000 mg | ORAL_TABLET | Freq: Every day | ORAL | Status: DC | PRN

## 2016-06-20 MED ORDER — MAGNESIUM CITRATE PO SOLN
1.0000 | Freq: Once | ORAL | Status: DC | PRN
  Filled 2016-06-20: qty 296

## 2016-06-20 MED ORDER — ONDANSETRON HCL 4 MG PO TABS: 4.0000 mg | ORAL_TABLET | Freq: Four times a day (QID) | ORAL | Status: DC | PRN

## 2016-06-20 MED ORDER — HEPARIN SODIUM (PORCINE) 5000 UNIT/ML IJ SOLN
5000.0000 [IU] | Freq: Three times a day (TID) | INTRAMUSCULAR | Status: DC
  Administered 2016-06-20 – 2016-06-22 (×5): 5000 [IU] via SUBCUTANEOUS
  Filled 2016-06-20 (×5): qty 1

## 2016-06-20 NOTE — ED Triage Notes (Signed)
Patient presents to the ED with coughing and "gurgling" and having difficulty breathing.  Patient presents to the ED with staff from [facility].  Staff states that patient was at a day program and while there staff reported that patient "ate a paper towel".  Since then patient has been coughing.  Patient has history of down syndrome and dementia.

## 2016-06-20 NOTE — ED Provider Notes (Signed)
[facility] Emergency Department Provider Note  ____________________________________________  Time seen: Approximately 6:57 PM  I have reviewed the triage vital signs and the nursing notes.   HISTORY  Chief Complaint Cough; Respiratory Distress; and Swallowed Foreign Body  Level 5 caveat:  Portions of the history and physical were unable to be obtained due to Down syndrome and dementia   HPI Jesus Morton is a 60 y.o. male the history of Down syndrome and dementia who presents for concerns of aspirated paper. Patient went to his day program where he a muffin and staff is concerned that patient may have aspirated the paper or the muffin. Right after eating it patient started to have severe cough and difficulty breathing. Patient tells me he is fine and that there is nothing stuck in his throat. Patient has not been sick recently and staff reports that this morning before going to day program patient was well. Patient does endorse some shortness of breath.    History reviewed. No pertinent past medical history.  There are no active problems to display for this patient.   History reviewed. No pertinent surgical history.  Prior to Admission medications   Medication Sig Start Date End Date Taking? Authorizing Provider  acetaminophen (TYLENOL) 500 MG tablet Take 500 mg by mouth every 6 (six) hours as needed.   Yes Historical Provider, MD  acidophilus (RISAQUAD) CAPS capsule Take 1 capsule by mouth daily.   Yes Historical Provider, MD  calcium-vitamin D (OSCAL WITH D) 500-200 MG-UNIT tablet Take 1 tablet by mouth 2 (two) times daily.   Yes Historical Provider, MD  donepezil (ARICEPT) 5 MG tablet Take 5 mg by mouth at bedtime.   Yes Historical Provider, MD  loratadine (CLARITIN) 10 MG tablet Take 10 mg by mouth daily.   Yes Historical Provider, MD  LORazepam (ATIVAN) 0.5 MG tablet Take 0.5 mg by mouth daily as needed for anxiety.   Yes Historical Provider, MD    Melatonin 3 MG TABS Take 1 tablet by mouth daily.   Yes Historical Provider, MD  memantine (NAMENDA) 10 MG tablet Take 10 mg by mouth daily.   Yes Historical Provider, MD  mupirocin ointment (BACTROBAN) 2 % Place 1 application into the nose 2 (two) times daily. Prn signs of cellulitis   Yes Historical Provider, MD  Naftifine HCl (NAFTIN) 1 % GEL Apply topically. Between toes   Yes Historical Provider, MD  nystatin-triamcinolone (MYCOLOG II) cream Apply 1 application topically 2 (two) times daily. Affected area of elbox flexor and groin   Yes Historical Provider, MD  omeprazole (PRILOSEC) 20 MG capsule Take 1 capsule by mouth 2 (two) times daily before a meal.    Yes Historical Provider, MD  penicillin v potassium (VEETID) 250 MG/5ML solution Take 250 mg by mouth 2 (two) times daily.   Yes Historical Provider, MD  sodium chloride (OCEAN) 0.65 % SOLN nasal spray Place 1 spray into both nostrils as needed for congestion.   Yes Historical Provider, MD  sucralfate (CARAFATE) 1 GM/10ML suspension Take 1 g by mouth 4 (four) times daily. With meals and bedtime   Yes Historical Provider, MD  vitamin B-12 (CYANOCOBALAMIN) 1000 MCG tablet Take 1,000 mcg by mouth daily.   Yes Historical Provider, MD    Allergies Patient has no known allergies.  No family history on file.  Social History Social History  Substance Use Topics  . Smoking status: Never Smoker  . Smokeless tobacco: Never Used  . Alcohol use No  Review of Systems  Constitutional: Negative for fever. Eyes: Negative for visual changes. ENT: Negative for sore throat. Neck: No neck pain  Cardiovascular: Negative for chest pain. Respiratory: + shortness of breath and cough Gastrointestinal: Negative for abdominal pain, vomiting or diarrhea. Genitourinary: Negative for dysuria. Musculoskeletal: Negative for back pain. Skin: Negative for rash. Neurological: Negative for headaches, weakness or numbness. Psych: No SI or  HI  ____________________________________________   PHYSICAL EXAM:  VITAL SIGNS: ED Triage Vitals  Enc Vitals Group     BP 06/20/16 1822 120/65     Pulse Rate 06/20/16 1822 85     Resp 06/20/16 1822 20     Temp 06/20/16 1822 98.3 F (36.8 C)     Temp Source 06/20/16 1822 Oral     SpO2 06/20/16 1822 93 %     Weight 06/20/16 1823 110 lb (49.9 kg)     Height 06/20/16 1823 5' (1.524 m)     Head Circumference --      Peak Flow --      Pain Score 06/20/16 1842 0     Pain Loc --      Pain Edu? --      Excl. in GC? --     Constitutional: Alert, actively coughing and spitting up clear sputum, no respiratory distress.  HEENT:      Head: Normocephalic and atraumatic.         Eyes: Conjunctivae are normal. Small amount of mucopurulent discharge from the L eye. Sclera is non-icteric. EOMI. PERRL      Mouth/Throat: Mucous membranes are moist.       Neck: Supple with no signs of meningismus. Cardiovascular: Regular rate and rhythm. No murmurs, gallops, or rubs. 2+ symmetrical distal pulses are present in all extremities. No JVD. Respiratory: Normal respiratory effort. Lungs are clear to auscultation bilaterally. No wheezes, crackles, or rhonchi.  Gastrointestinal: Soft, non tender, and non distended with positive bowel sounds. No rebound or guarding. Musculoskeletal: Nontender with normal range of motion in all extremities. No edema, cyanosis, or erythema of extremities. Neurologic: Face is symmetric. Moving all extremities.  Skin: Skin is warm, dry and intact. No rash noted.  ____________________________________________   LABS (all labs ordered are listed, but only abnormal results are displayed)  Labs Reviewed  CBC WITH DIFFERENTIAL/PLATELET - Abnormal; Notable for the following:       Result Value   WBC 11.5 (*)    MCV 101.7 (*)    MCH 34.1 (*)    Neutro Abs 10.1 (*)    Lymphs Abs 0.8 (*)    All other components within normal limits  BASIC METABOLIC PANEL - Abnormal; Notable  for the following:    Glucose, Bld 131 (*)    All other components within normal limits  INFLUENZA PANEL BY PCR (TYPE A & B, H1N1)   ____________________________________________  EKG  none ____________________________________________  RADIOLOGY  CXR: 1. Right basilar lung opacity most likely atelectasis. Consider pneumonia if there are consistent clinical symptoms. 2. Lungs hyperexpanded but otherwise clear. No other evidence of an acute abnormality. ____________________________________________   PROCEDURES  Procedure(s) performed: None Procedures Critical Care performed:  None ____________________________________________   INITIAL IMPRESSION / ASSESSMENT AND PLAN / ED COURSE  60 y.o. male the history of Down syndrome and dementia who presents for concerns of aspirated paper or food earlier today. Patient is actively coughing and sounds like a productive cough, he is bringing up clear sputum, he also has mild left-sided conjunctivitis which altogether could  be from a viral URI versus pneumonia. CXR is pending. Patient does not have any new oxygen requirement, he is actively coughing but no difficulty breathing, no stridor and airway is patent. However due to his mental disability and inability to provide history I do feel that I need to consult ear nose and throat for possible bronchoscopy as if this patient did aspirate paper I am concerned that he might become septic.  Clinical Course as of Jun 20 2048  Mon Jun 20, 2016  1957 CXR concerning for possible aspiration PNA. Patient remains stable with patent airway. I spoke with Dr. Meredeth Ide, pulmonologist who recommended admitting patient for IV abx and bronchoscopy in the am. Since patient does not have impending airway, Dr. Meredeth Ide did not recommended emergent bronchoscopy overnight. Patient will be given Unasyn.  [CV]    Clinical Course User Index [CV] Nita Sickle, MD    Pertinent labs & imaging results that were  available during my care of the patient were reviewed by me and considered in my medical decision making (see chart for details).    ____________________________________________   FINAL CLINICAL IMPRESSION(S) / ED DIAGNOSES  Final diagnoses:  Aspiration pneumonia of right lower lobe, unspecified aspiration pneumonia type (HCC)      NEW MEDICATIONS STARTED DURING THIS VISIT:  New Prescriptions   No medications on file     Note:  This document was prepared using Dragon voice recognition software and may include unintentional dictation errors.    Nita Sickle, MD 06/20/16 2049

## 2016-06-20 NOTE — H&P (Signed)
[facility] - [place] [facility] Admission History and Physical Tonye RoyaltyAlexis Kahlani Graber, D.O.  ---------------------------------------------------------------------------------------------------------------------   PATIENT NAME: Jesus Morton MR#: [id number] DATE OF BIRTH: 10/08/1955 DATE OF ADMISSION: 06/20/2016 PRIMARY CARE PHYSICIAN: Pasty Spillersracy N McLean-Scocuzza, MD  REQUESTING/REFERRING PHYSICIAN: ED Dr. Don PerkingVeronese  CHIEF COMPLAINT: Chief Complaint  Patient presents with  . Cough  . Respiratory Distress  . Swallowed Foreign Body    HISTORY OF PRESENT ILLNESS: Please note the entire history is obtained from the emergency department staff and documentation secondary to patient with Down syndrome and dementia  Jesus Morton is a 60 y.o. male with a known history of Down syndrome and dementia presents to the emergency department for evaluation of aspiration. Patient reportedly was eating a cupcake when he bit off some of the wrapper around the cupcake. He immediately began having coughing with difficulty breathing. On my questioning he denies any symptoms of cough, shortness of breath, chest pain, palpitations.  Per emergency department, ENT Dr. Willeen CassBennett will be performing a bronchoscopy in a.m.   Patient denies fevers/chills, weakness, dizziness, chest pain, shortness of breath, N/V/C/D, abdominal pain, dysuria/frequency, changes in mental status.   EMS/ED COURSE:   Patient received Unasyn.  PAST MEDICAL HISTORY: Dementia, Down syndrome    PAST SURGICAL HISTORY: History reviewed. No pertinent surgical history.    SOCIAL HISTORY: Social History  Substance Use Topics  . Smoking status: Never Smoker  . Smokeless tobacco: Never Used  . Alcohol use No      FAMILY HISTORY: Unable to obtain secondary to dementia   MEDICATIONS AT HOME: Prior to Admission medications   Medication Sig Start Date End Date Taking? Authorizing Provider  acetaminophen (TYLENOL) 500 MG tablet Take  500 mg by mouth every 6 (six) hours as needed.   Yes Historical Provider, MD  acidophilus (RISAQUAD) CAPS capsule Take 1 capsule by mouth daily.   Yes Historical Provider, MD  calcium-vitamin D (OSCAL WITH D) 500-200 MG-UNIT tablet Take 1 tablet by mouth 2 (two) times daily.   Yes Historical Provider, MD  donepezil (ARICEPT) 5 MG tablet Take 5 mg by mouth at bedtime.   Yes Historical Provider, MD  loratadine (CLARITIN) 10 MG tablet Take 10 mg by mouth daily.   Yes Historical Provider, MD  LORazepam (ATIVAN) 0.5 MG tablet Take 0.5 mg by mouth daily as needed for anxiety.   Yes Historical Provider, MD  Melatonin 3 MG TABS Take 1 tablet by mouth daily.   Yes Historical Provider, MD  memantine (NAMENDA) 10 MG tablet Take 10 mg by mouth daily.   Yes Historical Provider, MD  mupirocin ointment (BACTROBAN) 2 % Place 1 application into the nose 2 (two) times daily. Prn signs of cellulitis   Yes Historical Provider, MD  Naftifine HCl (NAFTIN) 1 % GEL Apply topically. Between toes   Yes Historical Provider, MD  nystatin-triamcinolone (MYCOLOG II) cream Apply 1 application topically 2 (two) times daily. Affected area of elbox flexor and groin   Yes Historical Provider, MD  omeprazole (PRILOSEC) 20 MG capsule Take 1 capsule by mouth 2 (two) times daily before a meal.    Yes Historical Provider, MD  penicillin v potassium (VEETID) 250 MG/5ML solution Take 250 mg by mouth 2 (two) times daily.   Yes Historical Provider, MD  sodium chloride (OCEAN) 0.65 % SOLN nasal spray Place 1 spray into both nostrils as needed for congestion.   Yes Historical Provider, MD  sucralfate (CARAFATE) 1 GM/10ML suspension Take 1 g by mouth 4 (four) times daily.  With meals and bedtime   Yes Historical Provider, MD  vitamin B-12 (CYANOCOBALAMIN) 1000 MCG tablet Take 1,000 mcg by mouth daily.   Yes Historical Provider, MD      DRUG ALLERGIES: No Known Allergies   REVIEW OF SYSTEMS:Patient denies all complaints however again unclear  how reliable his history is secondary to dementia CONSTITUTIONAL: No fatigue, weakness, fever, chills, weight gain/loss, headache EYES: No blurry or double vision. ENT: No tinnitus, postnasal drip, redness or soreness of the oropharynx. RESPIRATORY: No dyspnea, cough, wheeze, hemoptysis. CARDIOVASCULAR: No chest pain, orthopnea, palpitations, syncope. GASTROINTESTINAL: No nausea, vomiting, constipation, diarrhea, abdominal pain. No hematemesis, melena or hematochezia. GENITOURINARY: No dysuria, frequency, hematuria. ENDOCRINE: No polyuria or nocturia. No heat or cold intolerance. HEMATOLOGY: No anemia, bruising, bleeding. INTEGUMENTARY: No rashes, ulcers, lesions. MUSCULOSKELETAL: No pain, arthritis, swelling, gout. NEUROLOGIC: No numbness, tingling, weakness or ataxia. No seizure-type activity. PSYCHIATRIC: No anxiety, depression, insomnia.  PHYSICAL EXAMINATION: VITAL SIGNS: Blood pressure 120/65, pulse 85, temperature 98.3 F (36.8 C), temperature source Oral, resp. rate 20, height 5' (1.524 m), weight 49.9 kg (110 lb), SpO2 93 %.  GENERAL: 60 y.o.-year-old white male patient, well-developed, well-nourished lying in the bed in no acute distress.  Pleasant and cooperative.   HEENT: Head atraumatic, normocephalic. Pupils equal, round, reactive to light and accommodation. No scleral icterus. Extraocular muscles intact. Oropharynx is clear. Mucus membranes moist. NECK: Supple, full range of motion. No JVD, no bruit heard. No cervical lymphadenopathy. CHEST: Normal breath sounds bilaterally. No wheezing, rales, rhonchi or crackles. No use of accessory muscles of respiration.  No reproducible chest wall tenderness.  CARDIOVASCULAR: S1, S2 normal. No murmurs, rubs, or gallops appreciated. Cap refill <2 seconds. ABDOMEN: Soft, nontender, nondistended. No rebound, guarding, rigidity. Normoactive bowel sounds present in all four quadrants. No organomegaly or mass. EXTREMITIES: Full range of  motion. No pedal edema, cyanosis, or clubbing. NEUROLOGIC: Cranial nerves II through XII are grossly intact with no focal sensorimotor deficit. Muscle strength 5/5 in all extremities. Sensation intact. Gait not checked. PSYCHIATRIC: The patient is alert and oriented x 3. Normal affect, mood, thought content. SKIN: Warm, dry, and intact without obvious rash, lesion, or ulcer.  LABORATORY PANEL:  CBC  Recent Labs Lab 06/20/16 1850  WBC 11.5*  HGB 15.3  HCT 45.4  PLT 267   ----------------------------------------------------------------------------------------------------------------- Chemistries  Recent Labs Lab 06/20/16 1850  NA 138  K 4.2  CL 103  CO2 27  GLUCOSE 131*  BUN 14  CREATININE 0.89  CALCIUM 8.9   ------------------------------------------------------------------------------------------------------------------ Cardiac Enzymes No results for input(s): TROPONINI in the last 168 hours. ------------------------------------------------------------------------------------------------------------------  RADIOLOGY: Dg Chest 2 View  Result Date: 06/20/2016 CLINICAL DATA:  Patient presents to the ED with coughing and "gurgling" and having difficulty breathing. Patient presents to the ED with staff from [facility]. Staff states that patient was at a day program and while there staff reported that patient "ate a paper towel". Since then patient has been coughing. Patient has history of down syndrome and dementia. EXAM: CHEST  2 VIEW COMPARISON:  None. FINDINGS: The cardiac silhouette is normal in size and configuration. No mediastinal or hilar masses. No convincing adenopathy. Lungs are hyperexpanded. There is opacity at the right lung base. This is likely atelectasis. Infection is possible. Lungs are otherwise clear. No pleural effusion.  No pneumothorax. Skeletal structures are demineralized. There is a scoliosis of the thoracolumbar spine. IMPRESSION: 1. Right  basilar lung opacity most likely atelectasis. Consider pneumonia if there are consistent clinical  symptoms. 2. Lungs hyperexpanded but otherwise clear. No other evidence of an acute abnormality. Electronically Signed   By: Amie Portlandavid  Ormond M.D.   On: 06/20/2016 19:20    IMPRESSION AND PLAN:  This is a 60 y.o. male with a history of Down syndrome, dementia now being admitted with: 1. Aspiration pneumonia Admit to observation for consideration of bronchoscopy in the a.m. by ENT Dr. Willeen CassBennett. Unasyn for coverage for aspiration pneumonia as well as DuoNeb's, Mucinex, sputum culture. 2. History of dementia-continue Aricept and Namenda 3. History of allergies-continue Claritin 4. History of GERD-continue Prilosec and Carafate Patient may continue probiotics, calcium and D, Ativan for anxiety, trouble saline spray and B12  Diet/Nutrition: NPO Fluids: NS DVT GU:RKYHCWCPx:Heparin SCDs and early ambulation Code Status: Full  All the records are reviewed and case discussed with ED provider. Management plans discussed with the patient and/or family who express understanding and agree with plan of care.   TOTAL TIME TAKING CARE OF THIS PATIENT: 60 minutes.   Kennethia Lynes D.O. on 06/20/2016 at 9:08 PM Between 7am to 6pm - Pager - [phone] After 6pm go to [email or web address] - Social research officer, governmentpassword EPAS [facility] [place] [facility] Office [phone] CC: Primary care physician; Pasty Spillersracy N McLean-Scocuzza, MD     Note: This dictation was prepared with Dragon dictation along with smaller phrase technology. Any transcriptional errors that result from this process are unintentional.

## 2016-06-20 NOTE — ED Notes (Addendum)
Per caregiver pt may or may not have swallowed a paper towel today, pt hx of dementia and down syndrome. Pt denies anything being in his throat. Pt in NAD at this time

## 2016-06-20 NOTE — ED Notes (Signed)
Patient transported to X-ray 

## 2016-06-20 NOTE — Progress Notes (Signed)
Pharmacy Antibiotic Note  Reather LittlerMarshall Hammer is a 60 y.o. male admitted on 06/20/2016 with aspiration pneumonia.  Pharmacy has been consulted for Unasyn dosing.  Plan: Unasyn 3 grams q 6 hours ordered.  Height: 5' (152.4 cm) Weight: 110 lb (49.9 kg) IBW/kg (Calculated) : 50  Temp (24hrs), Avg:98.5 F (36.9 C), Min:98.3 F (36.8 C), Max:98.7 F (37.1 C)   Recent Labs Lab 06/20/16 1850  WBC 11.5*  CREATININE 0.89    Estimated Creatinine Clearance: 63.1 mL/min (by C-G formula based on SCr of 0.89 mg/dL).    No Known Allergies  Antimicrobials this admission: Unasyn 12/11 >>    >>   Dose adjustments this admission:   Microbiology results:  12/11 Sputum: pending     12/11 CXR R basilar opacity   Thank you for allowing pharmacy to be a part of this patient's care.  Jovani Flury S 06/20/2016 10:16 PM

## 2016-06-21 ENCOUNTER — Observation Stay: Payer: Medicare Other | Admitting: Certified Registered"

## 2016-06-21 ENCOUNTER — Encounter
Admission: EM | Disposition: A | Discharge status: Home / Self Care | Payer: Self-pay | Source: Home / Self Care | Attending: Emergency Medicine

## 2016-06-21 ENCOUNTER — Observation Stay: Payer: Medicare Other

## 2016-06-21 DIAGNOSIS — J690 Pneumonitis due to inhalation of food and vomit: Principal | ICD-10-CM

## 2016-06-21 DIAGNOSIS — R933 Abnormal findings on diagnostic imaging of other parts of digestive tract: Secondary | ICD-10-CM | POA: Diagnosis not present

## 2016-06-21 DIAGNOSIS — T17908A Unspecified foreign body in respiratory tract, part unspecified causing other injury, initial encounter: Secondary | ICD-10-CM

## 2016-06-21 DIAGNOSIS — T18128A Food in esophagus causing other injury, initial encounter: Secondary | ICD-10-CM | POA: Diagnosis not present

## 2016-06-21 DIAGNOSIS — T17900A Unspecified foreign body in respiratory tract, part unspecified causing asphyxiation, initial encounter: Secondary | ICD-10-CM | POA: Diagnosis not present

## 2016-06-21 DIAGNOSIS — R05 Cough: Secondary | ICD-10-CM

## 2016-06-21 DIAGNOSIS — R1310 Dysphagia, unspecified: Secondary | ICD-10-CM

## 2016-06-21 DIAGNOSIS — R059 Cough, unspecified: Secondary | ICD-10-CM

## 2016-06-21 HISTORY — PX: ESOPHAGOGASTRODUODENOSCOPY (EGD) WITH PROPOFOL: SHX5813

## 2016-06-21 LAB — BASIC METABOLIC PANEL
ANION GAP: 8 (ref 5–15)
BUN: 18 mg/dL (ref 6–20)
CALCIUM: 8.1 mg/dL — AB (ref 8.9–10.3)
CO2: 25 mmol/L (ref 22–32)
Chloride: 108 mmol/L (ref 101–111)
Creatinine, Ser: 1.16 mg/dL (ref 0.61–1.24)
GFR calc non Af Amer: >60 mL/min (ref >60)
GLUCOSE: 132 mg/dL — AB (ref 65–99)
POTASSIUM: 3.9 mmol/L (ref 3.5–5.1)
SODIUM: 141 mmol/L (ref 135–145)

## 2016-06-21 LAB — MRSA PCR SCREENING: MRSA BY PCR: NEGATIVE

## 2016-06-21 LAB — VITAMIN B12: VITAMIN B 12: 909 pg/mL (ref 180–914)

## 2016-06-21 LAB — CBC
HEMATOCRIT: 39.5 % — AB (ref 40.0–52.0)
HEMOGLOBIN: 13.7 g/dL (ref 13.0–18.0)
MCH: 35.2 pg — ABNORMAL HIGH (ref 26.0–34.0)
MCHC: 34.7 g/dL (ref 32.0–36.0)
MCV: 101.6 fL — ABNORMAL HIGH (ref 80.0–100.0)
Platelets: 243 10*3/uL (ref 150–440)
RBC: 3.89 MIL/uL — AB (ref 4.40–5.90)
RDW: 14.8 % — ABNORMAL HIGH (ref 11.5–14.5)
WBC: 16 10*3/uL — AB (ref 3.8–10.6)

## 2016-06-21 LAB — PROTIME-INR
INR: 1.11
PROTHROMBIN TIME: 14.4 s (ref 11.4–15.2)

## 2016-06-21 LAB — PROCALCITONIN: Procalcitonin: 6.15 ng/mL

## 2016-06-21 SURGERY — ESOPHAGOGASTRODUODENOSCOPY (EGD) WITH PROPOFOL
Anesthesia: General

## 2016-06-21 MED ORDER — FENTANYL CITRATE (PF) 100 MCG/2ML IJ SOLN
INTRAMUSCULAR | Status: DC | PRN
  Administered 2016-06-21: 50 ug via INTRAVENOUS

## 2016-06-21 MED ORDER — PROPOFOL 500 MG/50ML IV EMUL
INTRAVENOUS | Status: DC | PRN
  Administered 2016-06-21: 150 ug/kg/min via INTRAVENOUS

## 2016-06-21 MED ORDER — GUAIFENESIN ER 600 MG PO TB12: 600.0000 mg | ORAL_TABLET | Freq: Two times a day (BID) | ORAL | Status: DC

## 2016-06-21 MED ORDER — PROPOFOL 10 MG/ML IV BOLUS
INTRAVENOUS | Status: DC | PRN
  Administered 2016-06-21: 20 mg via INTRAVENOUS
  Administered 2016-06-21: 50 mg via INTRAVENOUS

## 2016-06-21 MED ORDER — POTASSIUM CHLORIDE IN NACL 20-0.9 MEQ/L-% IV SOLN
INTRAVENOUS | Status: DC
  Administered 2016-06-21 – 2016-06-22 (×2): via INTRAVENOUS
  Filled 2016-06-21 (×4): qty 1000

## 2016-06-21 MED ORDER — DM-GUAIFENESIN ER 30-600 MG PO TB12: 1.0000 | ORAL_TABLET | Freq: Two times a day (BID) | ORAL | Status: DC

## 2016-06-21 MED ORDER — PHENYLEPHRINE HCL 10 MG/ML IJ SOLN
INTRAMUSCULAR | Status: DC | PRN
  Administered 2016-06-21: 50 ug via INTRAVENOUS
  Administered 2016-06-21 (×3): 100 ug via INTRAVENOUS

## 2016-06-21 MED ORDER — SODIUM CHLORIDE 0.9 % IV BOLUS (SEPSIS)
1000.0000 mL | Freq: Once | INTRAVENOUS | Status: AC
  Administered 2016-06-21: 1000 mL via INTRAVENOUS

## 2016-06-21 MED ORDER — DEXTROMETHORPHAN POLISTIREX ER 30 MG/5ML PO SUER
30.0000 mg | Freq: Two times a day (BID) | ORAL | Status: DC
  Filled 2016-06-21 (×5): qty 5

## 2016-06-21 NOTE — Consult Note (Signed)
[name] MD  [address] [place], [place] [address] Phone: [phone] Fax : [phone]  Consultation  Referring Provider:     No ref. provider found Primary Care Physician:  [name], MD Primary Gastroenterologist:  Dr. [name]         Reason for Consultation:     Food impaction   Date of Admission:  06/20/2016 Date of Consultation:  06/21/2016         HPI:   Jesus Morton is a 60 y.o. male  With a history of Jesus Morton , dementia came in to the ER on 06/20/16 with aspiration . He was apparently eating a cupcake , immediately started coughing and then had difficulty with breathing, had cough. He was evaluated by Dr Drue Second for possible bronchoscopy due to aspiration, A ct scan showed in fact there was a possible food impaction in the distal esophagus, fluid content seen in the esophagus.  [name] He is being treated for a aspiration pneumonitis.   Spoke to the patient , he is unable to provide any history but appears comfortable, tolerating oral secretions. Not in any distress.   History reviewed. No pertinent past medical history.  History reviewed. No pertinent surgical history.  Prior to Admission medications   Medication Sig Start Date End Date Taking? Authorizing Provider  acetaminophen (TYLENOL) 500 MG tablet Take 500 mg by mouth every 6 (six) hours as needed.   Yes Historical Provider, MD  acidophilus (RISAQUAD) CAPS capsule Take 1 capsule by mouth daily.   Yes Historical Provider, MD  calcium-vitamin D (OSCAL WITH D) 500-200 MG-UNIT tablet Take 1 tablet by mouth 2 (two) times daily.   Yes Historical Provider, MD  donepezil (ARICEPT) 5 MG tablet Take 5 mg by mouth at bedtime.   Yes Historical Provider, MD  loratadine (CLARITIN) 10 MG tablet Take 10 mg by mouth daily.   Yes Historical Provider, MD  LORazepam (ATIVAN) 0.5 MG tablet Take 0.5 mg by mouth daily as needed for anxiety.   Yes Historical Provider, MD  Melatonin 3 MG TABS Take 1 tablet by mouth daily.    Yes Historical Provider, MD  memantine (NAMENDA) 10 MG tablet Take 10 mg by mouth daily.   Yes Historical Provider, MD  mupirocin ointment (BACTROBAN) 2 % Place 1 application into the nose 2 (two) times daily. Prn signs of cellulitis   Yes Historical Provider, MD  Naftifine HCl (NAFTIN) 1 % GEL Apply topically. Between toes   Yes Historical Provider, MD  nystatin-triamcinolone (MYCOLOG II) cream Apply 1 application topically 2 (two) times daily. Affected area of elbox flexor and groin   Yes Historical Provider, MD  omeprazole (PRILOSEC) 20 MG capsule Take 1 capsule by mouth 2 (two) times daily before a meal.    Yes Historical Provider, MD  penicillin v potassium (VEETID) 250 MG/5ML solution Take 250 mg by mouth 2 (two) times daily.   Yes Historical Provider, MD  sodium chloride (OCEAN) 0.65 % SOLN nasal spray Place 1 spray into both nostrils as needed for congestion.   Yes Historical Provider, MD  sucralfate (CARAFATE) 1 GM/10ML suspension Take 1 g by mouth 4 (four) times daily. With meals and bedtime   Yes Historical Provider, MD  vitamin B-12 (CYANOCOBALAMIN) 1000 MCG tablet Take 1,000 mcg by mouth daily.   Yes Historical Provider, MD    No family history on file.   Social History  Substance Use Topics  . Smoking status: Never Smoker  . Smokeless tobacco: Never Used  .  Alcohol use No    Allergies as of 06/20/2016  . (No Known Allergies)    Review of Systems:    All systems reviewed and negative except where noted in HPI.   Physical Exam:  Vital signs in last 24 hours: Temp:  [98.3 F (36.8 C)-100.7 F (38.2 C)] 99 F (37.2 C) (12/12 0438) Pulse Rate:  [51-89] 54 (12/12 0421) Resp:  [16-20] 19 (12/12 0421) BP: (120-128)/(65-99) 128/70 (12/12 0421) SpO2:  [92 %-99 %] 95 % (12/12 0421) Weight:  [101 lb 4.8 oz (45.9 kg)-110 lb (49.9 kg)] 101 lb 4.8 oz (45.9 kg) (12/11 2212) Last BM Date: 06/20/16 General:   Pleasant, Head: atraumatic. Eyes:   No icterus.   Conjunctiva pink.  PERRLA. Ears:  Normal auditory acuity. Neck:  Supple; no masses or thyroidomegaly Lungs: Respirations even and unlabored. Lungs decreased air entry bilaterally.   No wheezes, Heart:  Regular rate and rhythm;  Without murmur, clicks, rubs or gallops Abdomen:  Soft, nondistended, nontender. Normal bowel sounds. No appreciable masses or hepatomegaly.  No rebound or guarding.   LAB RESULTS:  Recent Labs  06/20/16 1850 06/21/16 0501  WBC 11.5* 16.0*  HGB 15.3 13.7  HCT 45.4 39.5*  PLT 267 243   BMET  Recent Labs  06/20/16 1850 06/21/16 0501  NA 138 141  K 4.2 3.9  CL 103 108  CO2 27 25  GLUCOSE 131* 132*  BUN 14 18  CREATININE 0.89 1.16  CALCIUM 8.9 8.1*   LFT No results for input(s): PROT, ALBUMIN, AST, ALT, ALKPHOS, BILITOT, BILIDIR, IBILI in the last 72 hours. PT/INR  Recent Labs  06/21/16 0501  LABPROT 14.4  INR 1.11    STUDIES: Dg Chest 2 View  Result Date: 06/20/2016 CLINICAL DATA:  Patient presents to the ED with coughing and "gurgling" and having difficulty breathing. Patient presents to the ED with staff from [facility]. Staff states that patient was at a day program and while there staff reported that patient "ate a paper towel". Since then patient has been coughing. Patient has history of down syndrome and dementia. EXAM: CHEST  2 VIEW COMPARISON:  None. FINDINGS: The cardiac silhouette is normal in size and configuration. No mediastinal or hilar masses. No convincing adenopathy. Lungs are hyperexpanded. There is opacity at the right lung base. This is likely atelectasis. Infection is possible. Lungs are otherwise clear. No pleural effusion.  No pneumothorax. Skeletal structures are demineralized. There is a scoliosis of the thoracolumbar spine. IMPRESSION: 1. Right basilar lung opacity most likely atelectasis. Consider pneumonia if there are consistent clinical symptoms. 2. Lungs hyperexpanded but otherwise clear. No other evidence of an acute  abnormality. Electronically Signed   By: Amie Portlandavid  Ormond M.D.   On: 06/20/2016 19:20   Ct Chest Wo Contrast  Result Date: 06/21/2016 CLINICAL DATA:  60 year old male for with history of Down's syndrome and dementia. Patient was in the emergency room for evaluation of aspiration and reported eating cupcake and bite off some wrapper paper. Patient is going for bronchoscopy is correlate EXAM: CT CHEST WITHOUT CONTRAST TECHNIQUE: Multidetector CT imaging of the chest was performed following the standard protocol without IV contrast. COMPARISON:  Chest radiograph dated 06/20/2016 FINDINGS: Evaluation is limited due to respiratory motion artifact as well as in the absence of intravenous contrast. Cardiovascular: There is no cardiomegaly or pericardial effusion. There is coronary vascular calcification. The aorta and central pulmonary arteries are grossly unremarkable on this noncontrast study. Mediastinum/Nodes: There is no hilar or  mediastinal adenopathy. There is a small hiatal hernia. There is thickened appearance of the gastroesophageal junction. There is a 2.2 x 2.4 cm food particle in the distal esophagus concerning for impacted food at the gastroesophageal junction. The esophagus is mildly distended. Liquid content noted throughout the esophagus. Layering high attenuating content in the distal esophagus noted. Lungs/Pleura: There are confluence nodular and ground-glass density predominantly involving the right upper, right middle and right lower lobe. Left lower lobe and lingular nodular density is also noted. Findings are concerning for aspiration pneumonia. There is no pleural effusion or pneumothorax. The central airways are patent. No definite foreign object noted within the trachea or main bronchi. Upper Abdomen: Small nonobstructing bilateral renal calculi. The visualized upper abdomen is otherwise unremarkable. Musculoskeletal: Mild diffuse subcutaneous edema. There is scoliosis with degenerative changes  of the thoracic spine. No acute fracture. IMPRESSION: Bilateral confluent nodular and ground-glass airspace opacities most compatible with multifocal pneumonia likely aspiration pneumonia. Recommend swallow study by speech pathology. No foreign object identified within the major airways. Small hiatal hernia with thickened appearance of the gastroesophageal junction. Probable impacted food particle in the distal esophagus at the gastroesophageal junction. There is dilatation of the esophagus with fluid content. Endoscopy is recommended to evaluate the gastroesophageal thickening. Electronically Signed   By: Elgie CollardArash  Radparvar M.D.   On: 06/21/2016 06:55      Impression / Plan:   Reather LittlerMarshall Huizinga is a 60 y.o. y/o male with history of downs syndrome, dementia , brought in after sudden onset of shortness of breath, coughing after consuming a cup cake yesterday. CT scan shows possible food impaction in the distal esophagus.  Plan  1. EGD today  2. Will obtain consent from legal guardian  Thank you for involving me in the care of this patient.      LOS: 0 days   Wyline MoodKiran Wilbur Labuda, MD  06/21/2016, 8:26 AM

## 2016-06-21 NOTE — Transfer of Care (Signed)
Immediate Anesthesia Transfer of Care Note  Patient: Jesus Morton  Procedure(s) Performed: Procedure(s): ESOPHAGOGASTRODUODENOSCOPY (EGD) WITH PROPOFOL (N/A)  Patient Location: PACU  Anesthesia Type:General  Level of Consciousness: sedated and responds to stimulation  Airway & Oxygen Therapy: Patient Spontanous Breathing and Patient connected to nasal cannula oxygen  Post-op Assessment: Report given to RN and Post -op Vital signs reviewed and stable  Post vital signs: Reviewed and stable  Last Vitals:  Vitals:   06/21/16 1238 06/21/16 1240  BP: (!) 82/38 (!) 99/42  Pulse:  78  Resp:  16  Temp: 36.1 C     Last Pain:  Vitals:   06/21/16 1238  TempSrc: Tympanic  PainSc:          Complications: No apparent anesthesia complications

## 2016-06-21 NOTE — Progress Notes (Signed)
Telephone consent for Bronchoscopy done with MD Mungal/ Henrine ScrewsAna Sherece Gambrill, RN. Marthe PatchJoel Bergerson consented to procedure.

## 2016-06-21 NOTE — Anesthesia Preprocedure Evaluation (Signed)
Anesthesia Evaluation  Patient identified by MRN, date of birth, ID band Patient awake    Reviewed: Allergy & Precautions, H&P , NPO status , Patient's Chart, lab work & pertinent test results, reviewed documented beta blocker date and time   Airway Mallampati: II   Neck ROM: full    Dental  (+) Poor Dentition   Pulmonary neg pulmonary ROS, pneumonia, resolved,    Pulmonary exam normal        Cardiovascular negative cardio ROS Normal cardiovascular exam Rhythm:regular Rate:Normal     Neuro/Psych negative neurological ROS  negative psych ROS   GI/Hepatic negative GI ROS, Neg liver ROS,   Endo/Other  negative endocrine ROS  Renal/GU negative Renal ROS  negative genitourinary   Musculoskeletal   Abdominal   Peds  Hematology negative hematology ROS (+)   Anesthesia Other Findings History reviewed. No pertinent past medical history. History reviewed. No pertinent surgical history. BMI    Body Mass Index:  19.78 kg/m     Reproductive/Obstetrics negative OB ROS                             Anesthesia Physical Anesthesia Plan  ASA: III and emergent  Anesthesia Plan: General   Post-op Pain Management:    Induction:   Airway Management Planned:   Additional Equipment:   Intra-op Plan:   Post-operative Plan:   Informed Consent: I have reviewed the patients History and Physical, chart, labs and discussed the procedure including the risks, benefits and alternatives for the proposed anesthesia with the patient or authorized representative who has indicated his/her understanding and acceptance.   Dental Advisory Given  Plan Discussed with: CRNA  Anesthesia Plan Comments:         Anesthesia Quick Evaluation

## 2016-06-21 NOTE — Op Note (Signed)
[facility] Gastroenterology Patient Name: Jesus Morton Procedure Date: 06/21/2016 12:23 PM MRN: [id number] Account #: [id number] Date of Birth: 07/24/1955 Admit Type: Inpatient Age: [age] Room: [facility] ENDO ROOM 4 Gender: Male Note Status: Finalized Procedure:            Upper GI endoscopy Indications:          Dysphagia Providers:            Midge Miniumarren Tyrann Donaho MD, MD Referring MD:         No Local Md, MD (Referring MD) Medicines:            Propofol per Anesthesia Complications:        No immediate complications. Procedure:            Pre-Anesthesia Assessment:                       - Prior to the procedure, a History and Physical was                        performed, and patient medications and allergies were                        reviewed. The patient's tolerance of previous                        anesthesia was also reviewed. The risks and benefits of                        the procedure and the sedation options and risks were                        discussed with the patient. All questions were                        answered, and informed consent was obtained. Prior                        Anticoagulants: The patient has taken no previous                        anticoagulant or antiplatelet agents. ASA Grade                        Assessment: II - A patient with mild systemic disease.                        After reviewing the risks and benefits, the patient was                        deemed in satisfactory condition to undergo the                        procedure.                       After obtaining informed consent, the endoscope was                        passed under direct vision. Throughout the procedure,  the patient's blood pressure, pulse, and oxygen                        saturations were monitored continuously. The Endoscope                        was introduced through the mouth, and advanced to the                        second  part of duodenum. The upper GI endoscopy was                        accomplished without difficulty. The patient tolerated                        the procedure well. Findings:      Hemorrhagic esophagitis with no bleeding was found in the entire       esophagus.      The stomach was normal.      The examined duodenum was normal. Impression:           - Hemorrhagic esophagitis.                       - Normal stomach.                       - Normal examined duodenum.                       - No specimens collected. Recommendation:       - Return patient to hospital ward for ongoing care. Procedure Code(s):    --- Professional ---                       [phone], Esophagogastroduodenoscopy, flexible, transoral;                        diagnostic, including collection of specimen(s) by                        brushing or washing, when performed (separate procedure) Diagnosis Code(s):    --- Professional ---                       R13.10, Dysphagia, unspecified                       K20.9, Esophagitis, unspecified CPT copyright 2016 American Medical Association. All rights reserved. The codes documented in this report are preliminary and upon coder review may  be revised to meet current compliance requirements. Midge Miniumarren Farrel Guimond MD, MD 06/21/2016 12:32:06 PM This report has been signed electronically. Number of Addenda: 0 Note Initiated On: 06/21/2016 12:23 PM      [facility]

## 2016-06-21 NOTE — NC FL2 (Signed)
[place] MEDICAID FL2 LEVEL OF CARE SCREENING TOOL     IDENTIFICATION  Patient Name: Jesus LittlerMarshall Cavagnaro Birthdate: Sep 28, 1955 Sex: male Admission Date (Current Location): 06/20/2016  [facility] [place]  [place]  ([id number] T) Facility and Address:  [facility], [address], [place], [place] [address]      Provider Number: [id number]  Attending Physician Name and Address:  Katharina Caperima Vaickute, MD  Relative Name and Phone Number:       Current Level of Care: Hospital Recommended Level of Care: Other (Comment) (Group Home. ) Prior Approval Number:    Date Approved/Denied:   PASRR Number:    Discharge Plan: Other (Comment) (Group Home. )    Current Diagnoses: Patient Active Problem List   Diagnosis Date Noted  . Foreign body aspiration   . Cough   . Pneumonitis due to food and vomit (HCC)   . Problems with swallowing and mastication   . Aspiration of foreign body   . Aspiration pneumonia of right lower lobe (HCC) 06/20/2016    Orientation RESPIRATION BLADDER Height & Weight     Self  Normal Continent Weight: 101 lb 4.8 oz (45.9 kg) Height:  5' (152.4 cm)  BEHAVIORAL SYMPTOMS/MOOD NEUROLOGICAL BOWEL NUTRITION STATUS   (none)  (none) Continent Diet (NPO to be advanced )  AMBULATORY STATUS COMMUNICATION OF NEEDS Skin   Independent Verbally Normal                       Personal Care Assistance Level of Assistance  Bathing, Feeding, Dressing Bathing Assistance: Limited assistance Feeding assistance: Independent Dressing Assistance: Limited assistance     Functional Limitations Info  Sight, Hearing, Speech Sight Info: Adequate Hearing Info: Adequate Speech Info: Adequate    SPECIAL CARE FACTORS FREQUENCY                       Contractures      Additional Factors Info  Code Status, Allergies Code Status Info:  (Full Code. ) Allergies Info:  (No Known Allergies. )           Current Medications (06/21/2016):   This is the current hospital active medication list Current Facility-Administered Medications  Medication Dose Route Frequency Provider Last Rate Last Dose  . 0.9 %  sodium chloride infusion   Intravenous Continuous Alexis Hugelmeyer, DO 75 mL/hr at 06/20/16 2251    . acetaminophen (TYLENOL) tablet 500 mg  500 mg Oral Q6H PRN Alexis Hugelmeyer, DO      . acidophilus (RISAQUAD) capsule 1 capsule  1 capsule Oral Daily Alexis Hugelmeyer, DO      . Ampicillin-Sulbactam (UNASYN) 3 g in sodium chloride 0.9 % 100 mL IVPB  3 g Intravenous Q6H Alexis Hugelmeyer, DO   3 g at 06/21/16 0844  . bisacodyl (DULCOLAX) EC tablet 5 mg  5 mg Oral Daily PRN Alexis Hugelmeyer, DO      . calcium-vitamin D (OSCAL WITH D) 500-200 MG-UNIT per tablet 1 tablet  1 tablet Oral BID Alexis Hugelmeyer, DO   1 tablet at 06/20/16 2251  . guaiFENesin (MUCINEX) 12 hr tablet 600 mg  600 mg Oral BID Alexis Hugelmeyer, DO       And  . dextromethorphan (DELSYM) 30 MG/5ML liquid 30 mg  30 mg Oral BID Alexis Hugelmeyer, DO      . donepezil (ARICEPT) tablet 5 mg  5 mg Oral QHS Alexis Hugelmeyer, DO      . heparin injection 5,000  Units  5,000 Units Subcutaneous Q8H Alexis Hugelmeyer, DO   5,000 Units at 06/21/16 0514  . HYDROcodone-acetaminophen (NORCO/VICODIN) 5-325 MG per tablet 1-2 tablet  1-2 tablet Oral Q4H PRN Alexis Hugelmeyer, DO      . ipratropium-albuterol (DUONEB) 0.5-2.5 (3) MG/3ML nebulizer solution 3 mL  3 mL Nebulization Q6H PRN Alexis Hugelmeyer, DO      . loratadine (CLARITIN) tablet 10 mg  10 mg Oral Daily [employer] Hugelmeyer, DO      . LORazepam (ATIVAN) tablet 0.5 mg  0.5 mg Oral Daily PRN Alexis Hugelmeyer, DO      . magnesium citrate solution 1 Bottle  1 Bottle Oral Once PRN Alexis Hugelmeyer, DO      . memantine (NAMENDA) tablet 10 mg  10 mg Oral Daily Alexis Hugelmeyer, DO      . mupirocin ointment (BACTROBAN) 2 % 1 application  1 application Nasal BID Alexis Hugelmeyer, DO      . ondansetron (ZOFRAN) tablet 4 mg  4  mg Oral Q6H PRN Alexis Hugelmeyer, DO       Or  . ondansetron (ZOFRAN) injection 4 mg  4 mg Intravenous Q6H PRN Alexis Hugelmeyer, DO      . pantoprazole (PROTONIX) EC tablet 40 mg  40 mg Oral Daily Alexis Hugelmeyer, DO      . senna-docusate (Senokot-S) tablet 1 tablet  1 tablet Oral QHS PRN Alexis Hugelmeyer, DO      . sodium chloride (OCEAN) 0.65 % nasal spray 1 spray  1 spray Each Nare PRN Alexis Hugelmeyer, DO      . sucralfate (CARAFATE) 1 GM/10ML suspension 1 g  1 g Oral QID Alexis Hugelmeyer, DO   1 g at 06/20/16 2251  . vitamin B-12 (CYANOCOBALAMIN) tablet 1,000 mcg  1,000 mcg Oral Daily Alexis Hugelmeyer, DO      . zolpidem (AMBIEN) tablet 5 mg  5 mg Oral QHS PRN,MR X 1 Alexis Hugelmeyer, DO         Discharge Medications: Please see discharge summary for a list of discharge medications.  Relevant Imaging Results:  Relevant Lab Results:   Additional Information  (SSN: [id number])  Pearson Picou, Darleen CrockerBailey M, LCSW

## 2016-06-21 NOTE — Progress Notes (Signed)
Pt is remaining on room air. Productive cough however pt swallows sputum. Unable to obtain sputum specimen. Pt has remained calm and cooperative this shift. Able to sleep during the night.

## 2016-06-21 NOTE — Anesthesia Procedure Notes (Signed)
Performed by: Kaniesha Barile Pre-anesthesia Checklist: Patient identified, Emergency Drugs available, Suction available, Patient being monitored and Timeout performed Patient Re-evaluated:Patient Re-evaluated prior to inductionOxygen Delivery Method: Nasal cannula Preoxygenation: Pre-oxygenation with 100% oxygen Intubation Type: IV induction       

## 2016-06-21 NOTE — Consult Note (Signed)
PULMONARY / CRITICAL CARE MEDICINE   Name: Jesus Morton MRN: [id number] DOB: [birth year]    ADMISSION DATE:  06/20/2016 CONSULTATION DATE:  06/21/16  REFERRING MD : Dr. Winona Legato   CHIEF COMPLAINT:   Reason for consult -   Possible aspiration of food/foreign object, evaluate for bronch   HISTORY OF PRESENT ILLNESS    60 y.o. male with a known history of Down syndrome and dementia presents to the emergency department for evaluation of aspiration. Patient reportedly was eating a cupcake when he bit off some of the wrapper around the cupcake. He immediately began having coughing with difficulty breathing. denies any symptoms of cough, shortness of breath, chest pain, palpitations. This morning noted to have cough with brownish sputum  Patient denies fevers/chills, weakness, dizziness, chest pain, shortness of breath, N/V/C/D, abdominal pain, dysuria/frequency, changes in mental status.   Patient had a CT Chest done this morning - no foreign body noted on Chest CT, but there is food particle lodged in his distal esophagus.   Spoke with brother Jesus Morton [phone]), obtained consent for bronch.   SIGNIFICANT EVENTS   06/20/16>eating muffin at group home, may have aspirated food particle of paper, started with intense coughing  PAST MEDICAL HISTORY    :  History reviewed. No pertinent past medical history. History reviewed. No pertinent surgical history. Prior to Admission medications   Medication Sig Start Date End Date Taking? Authorizing Provider  acetaminophen (TYLENOL) 500 MG tablet Take 500 mg by mouth every 6 (six) hours as needed.   Yes Historical Provider, MD  acidophilus (RISAQUAD) CAPS capsule Take 1 capsule by mouth daily.   Yes Historical Provider, MD  calcium-vitamin D (OSCAL WITH D) 500-200 MG-UNIT tablet Take 1 tablet by mouth 2 (two) times daily.   Yes Historical Provider, MD  donepezil (ARICEPT) 5 MG tablet Take 5 mg by mouth at bedtime.   Yes Historical  Provider, MD  loratadine (CLARITIN) 10 MG tablet Take 10 mg by mouth daily.   Yes Historical Provider, MD  LORazepam (ATIVAN) 0.5 MG tablet Take 0.5 mg by mouth daily as needed for anxiety.   Yes Historical Provider, MD  Melatonin 3 MG TABS Take 1 tablet by mouth daily.   Yes Historical Provider, MD  memantine (NAMENDA) 10 MG tablet Take 10 mg by mouth daily.   Yes Historical Provider, MD  mupirocin ointment (BACTROBAN) 2 % Place 1 application into the nose 2 (two) times daily. Prn signs of cellulitis   Yes Historical Provider, MD  Naftifine HCl (NAFTIN) 1 % GEL Apply topically. Between toes   Yes Historical Provider, MD  nystatin-triamcinolone (MYCOLOG II) cream Apply 1 application topically 2 (two) times daily. Affected area of elbox flexor and groin   Yes Historical Provider, MD  omeprazole (PRILOSEC) 20 MG capsule Take 1 capsule by mouth 2 (two) times daily before a meal.    Yes Historical Provider, MD  penicillin v potassium (VEETID) 250 MG/5ML solution Take 250 mg by mouth 2 (two) times daily.   Yes Historical Provider, MD  sodium chloride (OCEAN) 0.65 % SOLN nasal spray Place 1 spray into both nostrils as needed for congestion.   Yes Historical Provider, MD  sucralfate (CARAFATE) 1 GM/10ML suspension Take 1 g by mouth 4 (four) times daily. With meals and bedtime   Yes Historical Provider, MD  vitamin B-12 (CYANOCOBALAMIN) 1000 MCG tablet Take 1,000 mcg by mouth daily.   Yes Historical Provider, MD   No Known Allergies   FAMILY HISTORY  No family history on file.    SOCIAL HISTORY    reports that he has never smoked. He has never used smokeless tobacco. He reports that he does not drink alcohol. His drug history is not on file.  Review of Systems  Constitutional: Negative for fever.  HENT: Negative for hearing loss.   Eyes: Positive for blurred vision.  Respiratory: Positive for cough and sputum production. Negative for hemoptysis, shortness of breath and wheezing.    Cardiovascular: Negative for chest pain, palpitations and orthopnea.  Gastrointestinal: Negative for heartburn.  Genitourinary: Negative for dysuria.  Musculoskeletal: Negative for myalgias.  Skin: Negative for rash.  Neurological: Negative for dizziness.  Endo/Heme/Allergies: Does not bruise/bleed easily.  Psychiatric/Behavioral: Negative for depression.      VITAL SIGNS    Temp:  [98.3 F (36.8 C)-100.7 F (38.2 C)] 99 F (37.2 C) (12/12 0438) Pulse Rate:  [51-89] 54 (12/12 0421) Resp:  [16-20] 19 (12/12 0421) BP: (120-128)/(65-99) 128/70 (12/12 0421) SpO2:  [92 %-99 %] 95 % (12/12 0421) Weight:  [101 lb 4.8 oz (45.9 kg)-110 lb (49.9 kg)] 101 lb 4.8 oz (45.9 kg) (12/11 2212) HEMODYNAMICS:   VENTILATOR SETTINGS:   INTAKE / OUTPUT:  Intake/Output Summary (Last 24 hours) at 06/21/16 0802 Last data filed at 06/21/16 0304  Gross per 24 hour  Intake           416.25 ml  Output                0 ml  Net           416.25 ml       PHYSICAL EXAM   Physical Exam  Constitutional: He appears well-developed and well-nourished. No distress.  HENT:  Head: Normocephalic and atraumatic.  Eyes: Right eye exhibits no discharge. Left eye exhibits no discharge.  Neck: Normal range of motion. Neck supple.  Cardiovascular: Normal rate, regular rhythm and normal heart sounds.   No murmur heard. Pulmonary/Chest: Effort normal and breath sounds normal. No respiratory distress. He has no wheezes. He has no rales. He exhibits no tenderness.  Abdominal: Soft. Bowel sounds are normal. He exhibits no distension.  Musculoskeletal: Normal range of motion.  Neurological: He is alert.  Skin: Skin is warm and dry.  Psychiatric: He has a normal mood and affect.       LABS   LABS:  CBC  Recent Labs Lab 06/20/16 1850 06/21/16 0501  WBC 11.5* 16.0*  HGB 15.3 13.7  HCT 45.4 39.5*  PLT 267 243   Coag's  Recent Labs Lab 06/21/16 0501  INR 1.11   BMET  Recent Labs Lab  06/20/16 1850 06/21/16 0501  NA 138 141  K 4.2 3.9  CL 103 108  CO2 27 25  BUN 14 18  CREATININE 0.89 1.16  GLUCOSE 131* 132*   Electrolytes  Recent Labs Lab 06/20/16 1850 06/21/16 0501  CALCIUM 8.9 8.1*   Sepsis Markers No results for input(s): LATICACIDVEN, PROCALCITON, O2SATVEN in the last 168 hours. ABG No results for input(s): PHART, PCO2ART, PO2ART in the last 168 hours. Liver Enzymes No results for input(s): AST, ALT, ALKPHOS, BILITOT, ALBUMIN in the last 168 hours. Cardiac Enzymes No results for input(s): TROPONINI, PROBNP in the last 168 hours. Glucose No results for input(s): GLUCAP in the last 168 hours.   Recent Results (from the past 240 hour(s))  MRSA PCR Screening     Status: None   Collection Time: 06/21/16  4:27 AM  Result Value Ref Range  Status   MRSA by PCR NEGATIVE NEGATIVE Final    Comment:        The GeneXpert MRSA Assay (FDA approved for NASAL specimens only), is one component of a comprehensive MRSA colonization surveillance program. It is not intended to diagnose MRSA infection nor to guide or monitor treatment for MRSA infections.      Current Facility-Administered Medications:  .  0.9 %  sodium chloride infusion, , Intravenous, Continuous, Alexis Hugelmeyer, DO, Last Rate: 75 mL/hr at 06/20/16 2251 .  acetaminophen (TYLENOL) tablet 500 mg, 500 mg, Oral, Q6H PRN, Alexis Hugelmeyer, DO .  acidophilus (RISAQUAD) capsule 1 capsule, 1 capsule, Oral, Daily, Alexis Hugelmeyer, DO .  Ampicillin-Sulbactam (UNASYN) 3 g in sodium chloride 0.9 % 100 mL IVPB, 3 g, Intravenous, Q6H, Alexis Hugelmeyer, DO, 3 g at 06/21/16 0125 .  bisacodyl (DULCOLAX) EC tablet 5 mg, 5 mg, Oral, Daily PRN, Alexis Hugelmeyer, DO .  calcium-vitamin D (OSCAL WITH D) 500-200 MG-UNIT per tablet 1 tablet, 1 tablet, Oral, BID, Alexis Hugelmeyer, DO, 1 tablet at 06/20/16 2251 .  guaiFENesin (MUCINEX) 12 hr tablet 600 mg, 600 mg, Oral, BID **AND** dextromethorphan (DELSYM)  30 MG/5ML liquid 30 mg, 30 mg, Oral, BID, Alexis Hugelmeyer, DO .  donepezil (ARICEPT) tablet 5 mg, 5 mg, Oral, QHS, Alexis Hugelmeyer, DO .  heparin injection 5,000 Units, 5,000 Units, Subcutaneous, Q8H, Alexis Hugelmeyer, DO, 5,000 Units at 06/21/16 0514 .  HYDROcodone-acetaminophen (NORCO/VICODIN) 5-325 MG per tablet 1-2 tablet, 1-2 tablet, Oral, Q4H PRN, Alexis Hugelmeyer, DO .  ipratropium-albuterol (DUONEB) 0.5-2.5 (3) MG/3ML nebulizer solution 3 mL, 3 mL, Nebulization, Q6H PRN, Alexis Hugelmeyer, DO .  loratadine (CLARITIN) tablet 10 mg, 10 mg, Oral, Daily, Alexis Hugelmeyer, DO .  LORazepam (ATIVAN) tablet 0.5 mg, 0.5 mg, Oral, Daily PRN, Alexis Hugelmeyer, DO .  magnesium citrate solution 1 Bottle, 1 Bottle, Oral, Once PRN, Alexis Hugelmeyer, DO .  memantine (NAMENDA) tablet 10 mg, 10 mg, Oral, Daily, Alexis Hugelmeyer, DO .  mupirocin ointment (BACTROBAN) 2 % 1 application, 1 application, Nasal, BID, Alexis Hugelmeyer, DO .  ondansetron (ZOFRAN) tablet 4 mg, 4 mg, Oral, Q6H PRN **OR** ondansetron (ZOFRAN) injection 4 mg, 4 mg, Intravenous, Q6H PRN, Alexis Hugelmeyer, DO .  pantoprazole (PROTONIX) EC tablet 40 mg, 40 mg, Oral, Daily, Alexis Hugelmeyer, DO .  senna-docusate (Senokot-S) tablet 1 tablet, 1 tablet, Oral, QHS PRN, Alexis Hugelmeyer, DO .  sodium chloride (OCEAN) 0.65 % nasal spray 1 spray, 1 spray, Each Nare, PRN, Alexis Hugelmeyer, DO .  sucralfate (CARAFATE) 1 GM/10ML suspension 1 g, 1 g, Oral, QID, Alexis Hugelmeyer, DO, 1 g at 06/20/16 2251 .  vitamin B-12 (CYANOCOBALAMIN) tablet 1,000 mcg, 1,000 mcg, Oral, Daily, Alexis Hugelmeyer, DO .  zolpidem (AMBIEN) tablet 5 mg, 5 mg, Oral, QHS PRN,MR X 1, Alexis Hugelmeyer, DO  IMAGING    Dg Chest 2 View  Result Date: 06/20/2016 CLINICAL DATA:  Patient presents to the ED with coughing and "gurgling" and having difficulty breathing. Patient presents to the ED with staff from [facility]. Staff states that patient  was at a day program and while there staff reported that patient "ate a paper towel". Since then patient has been coughing. Patient has history of down syndrome and dementia. EXAM: CHEST  2 VIEW COMPARISON:  None. FINDINGS: The cardiac silhouette is normal in size and configuration. No mediastinal or hilar masses. No convincing adenopathy. Lungs are hyperexpanded. There is opacity at the right lung base. This is likely atelectasis.  Infection is possible. Lungs are otherwise clear. No pleural effusion.  No pneumothorax. Skeletal structures are demineralized. There is a scoliosis of the thoracolumbar spine. IMPRESSION: 1. Right basilar lung opacity most likely atelectasis. Consider pneumonia if there are consistent clinical symptoms. 2. Lungs hyperexpanded but otherwise clear. No other evidence of an acute abnormality. Electronically Signed   By: Amie Portland M.D.   On: 06/20/2016 19:20   Ct Chest Wo Contrast  Result Date: 06/21/2016 CLINICAL DATA:  60 year old male for with history of Down's syndrome and dementia. Patient was in the emergency room for evaluation of aspiration and reported eating cupcake and bite off some wrapper paper. Patient is going for bronchoscopy is correlate EXAM: CT CHEST WITHOUT CONTRAST TECHNIQUE: Multidetector CT imaging of the chest was performed following the standard protocol without IV contrast. COMPARISON:  Chest radiograph dated 06/20/2016 FINDINGS: Evaluation is limited due to respiratory motion artifact as well as in the absence of intravenous contrast. Cardiovascular: There is no cardiomegaly or pericardial effusion. There is coronary vascular calcification. The aorta and central pulmonary arteries are grossly unremarkable on this noncontrast study. Mediastinum/Nodes: There is no hilar or mediastinal adenopathy. There is a small hiatal hernia. There is thickened appearance of the gastroesophageal junction. There is a 2.2 x 2.4 cm food particle in the distal esophagus  concerning for impacted food at the gastroesophageal junction. The esophagus is mildly distended. Liquid content noted throughout the esophagus. Layering high attenuating content in the distal esophagus noted. Lungs/Pleura: There are confluence nodular and ground-glass density predominantly involving the right upper, right middle and right lower lobe. Left lower lobe and lingular nodular density is also noted. Findings are concerning for aspiration pneumonia. There is no pleural effusion or pneumothorax. The central airways are patent. No definite foreign object noted within the trachea or main bronchi. Upper Abdomen: Small nonobstructing bilateral renal calculi. The visualized upper abdomen is otherwise unremarkable. Musculoskeletal: Mild diffuse subcutaneous edema. There is scoliosis with degenerative changes of the thoracic spine. No acute fracture. IMPRESSION: Bilateral confluent nodular and ground-glass airspace opacities most compatible with multifocal pneumonia likely aspiration pneumonia. Recommend swallow study by speech pathology. No foreign object identified within the major airways. Small hiatal hernia with thickened appearance of the gastroesophageal junction. Probable impacted food particle in the distal esophagus at the gastroesophageal junction. There is dilatation of the esophagus with fluid content. Endoscopy is recommended to evaluate the gastroesophageal thickening. Electronically Signed   By: Elgie Collard M.D.   On: 06/21/2016 06:55      Indwelling Urinary Catheter continued, requirement due to   Reason to continue Indwelling Urinary Catheter for strict Intake/Output monitoring for hemodynamic instability   Central Line continued, requirement due to   Reason to continue [employer] Monitoring of central venous pressure or other hemodynamic parameters   Ventilator continued, requirement due to, resp failure    Ventilator Sedation RASS 0 to -2      ASSESSMENT/PLAN   60  yo with concern for aspiration of foreign body.  Foreign body lodge in distal esophagus Cough  Bilateral upper lobe pneumonitis 2/2 to possible micro aspiration  Plan - no bronchoscopy given current CT finding of particle (2cm) lodged in distal esophagus - spoke with Dr. [name], he will evaluate patient for an endoscopy - Antibiotic to aspiration pneumonitis x 3 days, then stop - bronchodilator x 3 days then stop - updated brother Francis Dowse [phone]) - if there is concern for foreign body aspiration into the airway after GI eval, then we will  re-eval for bronch.   Thank you for consulting [facility], we will signoff at this time.  Please feel free to contact us with any questions at [phone] (please enter 7-digits).   Pulmonary Care Time devoted to patient care services described in this note is 40 minutes.   Overall, patient is critically ill, prognosis is guarded. Patient at high risk for cardiac arrest and death.   Stephanie AcreVishal Emeterio Balke, MD  Pulmonary and Critical Care Pager [phone] (please enter 7-digits) On Call Pager - [phone] (please enter 7-digits)     06/21/2016, 8:02 AM  Note: This note was prepared with Dragon dictation along with smaller phrase technology. Any transcriptional errors that result from this process are unintentional.

## 2016-06-21 NOTE — Progress Notes (Signed)
[facility] - [place] at [facility]   PATIENT NAME: Jesus Morton    MR#:  [id number]  DATE OF BIRTH:  21-Sep-1955  SUBJECTIVE:  CHIEF COMPLAINT:   Chief Complaint  Patient presents with  . Cough  . Respiratory Distress  . Swallowed Foreign Body  The patient is a 60 year old Caucasian male with past medical history significant history of Down syndrome, dementia, who presents to the hospital with cough, respiratory distress, after swallowing foreign body, wrapper around the cupcake. CT scan of chest revealed impacted food particle in distal aspect of esophagus. The patient underwent EGD today by Dr. Servando Snare, revealing hemorrhagic esophagitis, no other abnormalities  Review of Systems  Unable to perform ROS: Dementia    VITAL SIGNS: Blood pressure (!) 90/58, pulse 88, temperature 98.3 F (36.8 C), temperature source Oral, resp. rate 20, height 5' (1.524 m), weight 45.9 kg (101 lb 4.8 oz), SpO2 92 %.  PHYSICAL EXAMINATION:   GENERAL:  60 y.o.-year-old patient lying in the bed with no acute distress.  EYES: Pupils equal, round, reactive to light and accommodation. No scleral icterus. Extraocular muscles intact.  HEENT: Head atraumatic, normocephalic. Oropharynx and nasopharynx clear.  NECK:  Supple, no jugular venous distention. No thyroid enlargement, no tenderness.  LUNGS: Markedly diminished breath sounds on the right, no wheezing, diffuse scattered right-sided rales,rhonchi and crepitations noted, clear lung field on the left. Intermittent use of accessory muscles of respiration.  CARDIOVASCULAR: S1, S2 normal. No murmurs, rubs, or gallops.  ABDOMEN: Soft, nontender, nondistended. Bowel sounds present. No organomegaly or mass.  EXTREMITIES: No pedal edema, cyanosis, or clubbing.  NEUROLOGIC: Cranial nerves II through XII are intact. Muscle strength 5/5 in all extremities. Sensation grossly intact. Gait not checked.  PSYCHIATRIC: The patient is alert , not able  to assess orientation due to dementia  SKIN: No obvious rash, lesion, or ulcer.   ORDERS/RESULTS REVIEWED:   CBC  Recent Labs Lab 06/20/16 1850 06/21/16 0501  WBC 11.5* 16.0*  HGB 15.3 13.7  HCT 45.4 39.5*  PLT 267 243  MCV 101.7* 101.6*  MCH 34.1* 35.2*  MCHC 33.6 34.7  RDW 14.5 14.8*  LYMPHSABS 0.8*  --   MONOABS 0.4  --   EOSABS 0.0  --   BASOSABS 0.1  --    ------------------------------------------------------------------------------------------------------------------  Chemistries   Recent Labs Lab 06/20/16 1850 06/21/16 0501  NA 138 141  K 4.2 3.9  CL 103 108  CO2 27 25  GLUCOSE 131* 132*  BUN 14 18  CREATININE 0.89 1.16  CALCIUM 8.9 8.1*   ------------------------------------------------------------------------------------------------------------------ estimated creatinine clearance is 44.5 mL/min (by C-G formula based on SCr of 1.16 mg/dL). ------------------------------------------------------------------------------------------------------------------ No results for input(s): TSH, T4TOTAL, T3FREE, THYROIDAB in the last 72 hours.  Invalid input(s): FREET3  Cardiac Enzymes No results for input(s): CKMB, TROPONINI, MYOGLOBIN in the last 168 hours.  Invalid input(s): CK ------------------------------------------------------------------------------------------------------------------ Invalid input(s): POCBNP ---------------------------------------------------------------------------------------------------------------  RADIOLOGY: Dg Chest 2 View  Result Date: 06/20/2016 CLINICAL DATA:  Patient presents to the ED with coughing and "gurgling" and having difficulty breathing. Patient presents to the ED with staff from [facility]. Staff states that patient was at a day program and while there staff reported that patient "ate a paper towel". Since then patient has been coughing. Patient has history of down syndrome and dementia. EXAM: CHEST   2 VIEW COMPARISON:  None. FINDINGS: The cardiac silhouette is normal in size and configuration. No mediastinal or hilar masses. No convincing adenopathy. Lungs are  hyperexpanded. There is opacity at the right lung base. This is likely atelectasis. Infection is possible. Lungs are otherwise clear. No pleural effusion.  No pneumothorax. Skeletal structures are demineralized. There is a scoliosis of the thoracolumbar spine. IMPRESSION: 1. Right basilar lung opacity most likely atelectasis. Consider pneumonia if there are consistent clinical symptoms. 2. Lungs hyperexpanded but otherwise clear. No other evidence of an acute abnormality. Electronically Signed   By: Amie Portlandavid  Ormond M.D.   On: 06/20/2016 19:20   Ct Chest Wo Contrast  Result Date: 06/21/2016 CLINICAL DATA:  60 year old male for with history of Down's syndrome and dementia. Patient was in the emergency room for evaluation of aspiration and reported eating cupcake and bite off some wrapper paper. Patient is going for bronchoscopy is correlate EXAM: CT CHEST WITHOUT CONTRAST TECHNIQUE: Multidetector CT imaging of the chest was performed following the standard protocol without IV contrast. COMPARISON:  Chest radiograph dated 06/20/2016 FINDINGS: Evaluation is limited due to respiratory motion artifact as well as in the absence of intravenous contrast. Cardiovascular: There is no cardiomegaly or pericardial effusion. There is coronary vascular calcification. The aorta and central pulmonary arteries are grossly unremarkable on this noncontrast study. Mediastinum/Nodes: There is no hilar or mediastinal adenopathy. There is a small hiatal hernia. There is thickened appearance of the gastroesophageal junction. There is a 2.2 x 2.4 cm food particle in the distal esophagus concerning for impacted food at the gastroesophageal junction. The esophagus is mildly distended. Liquid content noted throughout the esophagus. Layering high attenuating content in the distal  esophagus noted. Lungs/Pleura: There are confluence nodular and ground-glass density predominantly involving the right upper, right middle and right lower lobe. Left lower lobe and lingular nodular density is also noted. Findings are concerning for aspiration pneumonia. There is no pleural effusion or pneumothorax. The central airways are patent. No definite foreign object noted within the trachea or main bronchi. Upper Abdomen: Small nonobstructing bilateral renal calculi. The visualized upper abdomen is otherwise unremarkable. Musculoskeletal: Mild diffuse subcutaneous edema. There is scoliosis with degenerative changes of the thoracic spine. No acute fracture. IMPRESSION: Bilateral confluent nodular and ground-glass airspace opacities most compatible with multifocal pneumonia likely aspiration pneumonia. Recommend swallow study by speech pathology. No foreign object identified within the major airways. Small hiatal hernia with thickened appearance of the gastroesophageal junction. Probable impacted food particle in the distal esophagus at the gastroesophageal junction. There is dilatation of the esophagus with fluid content. Endoscopy is recommended to evaluate the gastroesophageal thickening. Electronically Signed   By: Elgie CollardArash  Radparvar M.D.   On: 06/21/2016 06:55    EKG:  Orders placed or performed in visit on 11/14/12  . EKG 12-Lead    ASSESSMENT AND PLAN:  Active Problems:   Aspiration pneumonia of right lower lobe (HCC)   Foreign body aspiration   Cough   Pneumonitis due to food and vomit (HCC)   Problems with swallowing and mastication   Aspiration of foreign body  #1. Acute respiratory failure with hypoxia due to aspiration pneumonitis, continue oxygen therapy as needed., O2 sats 92-97% on 3 L of oxygen at present #2. Aspiration pneumonitis, continue Unasyn, following clinically #3. Foreign body in esophagus, status post EGD, 06/21/2016 by Dr. Servando SnareWohl, hemorrhagic esophagitis noted,  supportive therapy, speech therapist evaluation #4. Hypertension, initiate IV fluid bolus, advance IV fluids if needed  Management plans discussed with the patient, family and they are in agreement.   DRUG ALLERGIES: No Known Allergies  CODE STATUS:     Code Status Orders  Start     Ordered   06/20/16 2213  Full code  Continuous     06/20/16 2212    Code Status History    Date Active Date Inactive Code Status Order ID Comments User Context   This patient has a current code status but no historical code status.      TOTAL TIME TAKING CARE OF THIS PATIENT: 40 minutes.   Discussed with caregiver    DR Winona LegatoVaickute,  M.D on 06/21/2016 at 4:20 PM  Between 7am to 6pm - Pager - [phone]  After 6pm go to [email or web address] - password [email or web address] [facility]  [facility]  Office  [phone]  CC: Primary care physician; Pasty Spillersracy N McLean-Scocuzza, MD

## 2016-06-21 NOTE — Clinical Social Work Note (Signed)
Clinical Social Work Assessment  Patient Details  Name: Jesus Morton MRN: [id number] Date of Birth: 1955/12/25  Date of referral:  06/21/16               Reason for consult:  Other (Comment Required) (From [facility])                Permission sought to share information with:  Facility Art therapist granted to share information::  Yes, Verbal Permission Granted  Name::        Agency::     Relationship::     Contact Information:     Housing/Transportation Living arrangements for the past 2 months:  [place] of Information:  Facility, Guardian Patient Interpreter Needed:  None Criminal Activity/Legal Involvement Pertinent to Current Situation/Hospitalization:  No - Comment as needed Significant Relationships:  Siblings Lives with:  Facility Resident Do you feel safe going back to the place where you live?    Need for family participation in patient care:  Yes (Comment)  Care giving concerns:  Patient is a long term care resident at [facility] in [place].     Social Worker assessment / plan:  Holiday representative ([place]) reviewed chart and noted that patient is from [facility] group home. CSW left 2 voicemail's for [name] for [facility]. CSW met with patient and is caregiver [name] from [facility] and sister [name] were at bedside. Patient appeared in a pleasant mood and was watching T.V. Per sister patient's brother [name] [phone] is patient's legal guardian. CSW contacted patient's brother [name]. Per brother patient has lived at several group homes through [facility] and has been a part of the program since he was 60 y.o. Per brother patient will return to group home when stable. CSW will continue to follow and assist as needed.   Employment status:  Disabled (Comment on whether or not currently receiving Disability) Insurance information:  Medicare, Medicaid [place] PT Recommendations:  Not assessed at this  time Information / Referral to community resources:  Other (Comment Required) (Patient will return to [employer]. )  Patient/Family's Response to care:  Patient's guardian prefers for patient to return to group home.   Patient/Family's Understanding of and Emotional Response to Diagnosis, Current Treatment, and Prognosis:  Patient and his brother and sister were very pleasant and thanked CSW for visit.   Emotional Assessment Appearance:  Appears stated age Attitude/Demeanor/Rapport:    Affect (typically observed):  Pleasant Orientation:  Oriented to Self Alcohol / Substance use:  Not Applicable Psych involvement (Current and /or in the community):  No (Comment)  Discharge Needs  Concerns to be addressed:  Discharge Planning Concerns Readmission within the last 30 days:  No Current discharge risk:  Chronically ill, Cognitively Impaired Barriers to Discharge:  Continued Medical Work up   [employer], [name], LCSW 06/21/2016, 2:00 PM

## 2016-06-22 DIAGNOSIS — I959 Hypotension, unspecified: Secondary | ICD-10-CM

## 2016-06-22 DIAGNOSIS — K2091 Esophagitis, unspecified with bleeding: Secondary | ICD-10-CM

## 2016-06-22 DIAGNOSIS — K208 Other esophagitis: Secondary | ICD-10-CM

## 2016-06-22 DIAGNOSIS — J690 Pneumonitis due to inhalation of food and vomit: Secondary | ICD-10-CM | POA: Diagnosis not present

## 2016-06-22 DIAGNOSIS — R198 Other specified symptoms and signs involving the digestive system and abdomen: Secondary | ICD-10-CM

## 2016-06-22 DIAGNOSIS — T189XXA Foreign body of alimentary tract, part unspecified, initial encounter: Secondary | ICD-10-CM

## 2016-06-22 DIAGNOSIS — K228 Other specified diseases of esophagus: Secondary | ICD-10-CM

## 2016-06-22 LAB — CBC
HCT: 39.7 % — ABNORMAL LOW (ref 40.0–52.0)
Hemoglobin: 13.2 g/dL (ref 13.0–18.0)
MCH: 34.1 pg — AB (ref 26.0–34.0)
MCHC: 33.1 g/dL (ref 32.0–36.0)
MCV: 103.1 fL — ABNORMAL HIGH (ref 80.0–100.0)
PLATELETS: 225 10*3/uL (ref 150–440)
RBC: 3.85 MIL/uL — AB (ref 4.40–5.90)
RDW: 14.7 % — ABNORMAL HIGH (ref 11.5–14.5)
WBC: 13.9 10*3/uL — ABNORMAL HIGH (ref 3.8–10.6)

## 2016-06-22 MED ORDER — DEXTROMETHORPHAN POLISTIREX ER 30 MG/5ML PO SUER: 30.0000 mg | Freq: Two times a day (BID) | ORAL | 0 refills | Status: DC

## 2016-06-22 MED ORDER — HYPROMELLOSE (GONIOSCOPIC) 2.5 % OP SOLN: 1.0000 [drp] | OPHTHALMIC | 12 refills | Status: AC

## 2016-06-22 MED ORDER — PANTOPRAZOLE SODIUM 40 MG PO TBEC: 40.0000 mg | DELAYED_RELEASE_TABLET | Freq: Every day | ORAL | 5 refills | Status: AC

## 2016-06-22 MED ORDER — GUAIFENESIN ER 600 MG PO TB12: 600.0000 mg | ORAL_TABLET | Freq: Two times a day (BID) | ORAL | 0 refills | Status: DC

## 2016-06-22 MED ORDER — LORAZEPAM 0.5 MG PO TABS: 0.5000 mg | ORAL_TABLET | Freq: Every day | ORAL | 0 refills | Status: DC | PRN

## 2016-06-22 MED ORDER — MUPIROCIN 2 % EX OINT: 1.0000 "application " | TOPICAL_OINTMENT | Freq: Two times a day (BID) | CUTANEOUS | 0 refills | Status: DC

## 2016-06-22 MED ORDER — AMOXICILLIN-POT CLAVULANATE 600-42.9 MG/5ML PO SUSR: 600.0000 mg | Freq: Three times a day (TID) | ORAL | 0 refills | Status: DC

## 2016-06-22 MED ORDER — ARTIFICIAL TEARS OP OINT: TOPICAL_OINTMENT | Freq: Every day | OPHTHALMIC | 6 refills | Status: AC

## 2016-06-22 NOTE — NC FL2 (Signed)
[place] MEDICAID FL2 LEVEL OF CARE SCREENING TOOL     IDENTIFICATION  Patient Name: Jesus Morton Birthdate: Dec 26, 1955 Sex: male Admission Date (Current Location): 06/20/2016  [facility] [place]  [place]  ([id number]) Facility and Address:  [facility], [address], [place], [place] [address]      Provider Number: [id number]  Attending Physician Name and Address:  Katharina Caperima Vaickute, MD  Relative Name and Phone Number:       Current Level of Care: Hospital Recommended Level of Care: Other (Comment) (Group Home. ) Prior Approval Number:    Date Approved/Denied:   PASRR Number:    Discharge Plan: Other (Comment) (Group Home. )    Current Diagnoses: Patient Active Problem List   Diagnosis Date Noted  . Foreign body alimentary tract 06/22/2016  . Abnormal findings on esophagogastroduodenoscopy (EGD) 06/22/2016  . Hemorrhagic esophagitis 06/22/2016  . Hypotension 06/22/2016  . Aspiration pneumonia (HCC) 06/22/2016  . Foreign body aspiration   . Cough   . Pneumonitis due to food and vomit (HCC)   . Problems with swallowing and mastication   . Aspiration of foreign body   . Aspiration pneumonia of right lower lobe (HCC) 06/20/2016    Orientation RESPIRATION BLADDER Height & Weight     Self  Normal Continent Weight: 101 lb 4.8 oz (45.9 kg) Height:  5' (152.4 cm)  BEHAVIORAL SYMPTOMS/MOOD NEUROLOGICAL BOWEL NUTRITION STATUS   (none)  (none) Continent Dysphagia level 1 - PUREE consistency; Thin liquids; strict Aspiration precautions. Pills given w/ a Puree if easier, safer for swallowing. Monitoring at all meals for any impulsive eating behaviors - slow down, smaller bites, alternate food/liquidREFLUX/GERD Precautions. .  AMBULATORY STATUS COMMUNICATION OF NEEDS Skin   Independent Verbally Normal                       Personal Care Assistance Level of Assistance  Bathing, Feeding, Dressing Bathing Assistance: Limited  assistance Feeding assistance: Independent Dressing Assistance: Limited assistance     Functional Limitations Info  Sight, Hearing, Speech Sight Info: Adequate Hearing Info: Adequate Speech Info: Adequate    SPECIAL CARE FACTORS FREQUENCY   Outpatient PT  Anselm Pancoastalph Scott RN and SLP to follow.                     Contractures      Additional Factors Info  Code Status, Allergies Code Status Info:  (Full Code. ) Allergies Info:  (No Known Allergies. )          Discharge Medications: Please see discharge summary for a list of discharge medications. Current Discharge Medication List        START taking these medications   Details  amoxicillin-clavulanate (AUGMENTIN ES-600) 600-42.9 MG/5ML suspension Take 5 mLs (600 mg total) by mouth 3 (three) times daily. Qty: 200 mL, Refills: 0    dextromethorphan (DELSYM) 30 MG/5ML liquid Take 5 mLs (30 mg total) by mouth 2 (two) times daily. Qty: 89 mL, Refills: 0    guaiFENesin (MUCINEX) 600 MG 12 hr tablet Take 1 tablet (600 mg total) by mouth 2 (two) times daily. Qty: 20 tablet, Refills: 0    pantoprazole (PROTONIX) 40 MG tablet Take 1 tablet (40 mg total) by mouth daily. Qty: 30 tablet, Refills: 5          CONTINUE these medications which have CHANGED   Details  LORazepam (ATIVAN) 0.5 MG tablet Take 1 tablet (0.5 mg total) by mouth daily  as needed for anxiety. Qty: 30 tablet, Refills: 0    mupirocin ointment (BACTROBAN) 2 % Place 1 application into the nose 2 (two) times daily. Qty: 22 g, Refills: 0          CONTINUE these medications which have NOT CHANGED   Details  acetaminophen (TYLENOL) 500 MG tablet Take 500 mg by mouth every 6 (six) hours as needed.    acidophilus (RISAQUAD) CAPS capsule Take 1 capsule by mouth daily.    calcium-vitamin D (OSCAL WITH D) 500-200 MG-UNIT tablet Take 1 tablet by mouth 2 (two) times daily.    donepezil (ARICEPT) 5 MG tablet Take 5 mg by mouth at bedtime.     loratadine (CLARITIN) 10 MG tablet Take 10 mg by mouth daily.    Melatonin 3 MG TABS Take 1 tablet by mouth daily.    memantine (NAMENDA) 10 MG tablet Take 10 mg by mouth daily.    Naftifine HCl (NAFTIN) 1 % GEL Apply topically. Between toes    nystatin-triamcinolone (MYCOLOG II) cream Apply 1 application topically 2 (two) times daily. Affected area of elbox flexor and groin    sodium chloride (OCEAN) 0.65 % SOLN nasal spray Place 1 spray into both nostrils as needed for congestion.    sucralfate (CARAFATE) 1 GM/10ML suspension Take 1 g by mouth 4 (four) times daily. With meals and bedtime    vitamin B-12 (CYANOCOBALAMIN) 1000 MCG tablet Take 1,000 mcg by mouth daily.         STOP taking these medications     omeprazole (PRILOSEC) 20 MG capsule      penicillin v potassium (VEETID) 250 MG/5ML solution  Relevant Imaging Results: Relevant Lab Results: Additional Information  (SSN: [id number])  Shayde Gervacio, Darleen CrockerBailey M, LCSW

## 2016-06-22 NOTE — Progress Notes (Signed)
Pharmacy Antibiotic Note  Jesus Morton is a 60 y.o. male admitted on 06/20/2016 with aspiration pneumonia subsequent to foreign body aspiration.  Pharmacy has been consulted for Unasyn dosing.  Plan: Spoke to Dr. Winona LegatoVaickute regarding CCM suggestion to stop abx after 3 days and Dr. Winona LegatoVaickute wants to continue abx possibly for 7 days. Patient is not currently a candidate for PO conversion to Augmentin.  Continue Unasyn 3 g iv q 6 hours.   Height: 5' (152.4 cm) Weight: 101 lb 4.8 oz (45.9 kg) IBW/kg (Calculated) : 50  Temp (24hrs), Avg:98.1 F (36.7 C), Min:97 F (36.1 C), Max:98.7 F (37.1 C)   Recent Labs Lab 06/20/16 1850 06/21/16 0501 06/22/16 0458  WBC 11.5* 16.0* 13.9*  CREATININE 0.89 1.16  --     Estimated Creatinine Clearance: 44.5 mL/min (by C-G formula based on SCr of 1.16 mg/dL).    No Known Allergies  Antimicrobials this admission: Unasyn 12/11 >>    Dose adjustments this admission:  Microbiology results: 12/12 MRSA PCR: negative SCx: sent  Thank you for allowing pharmacy to be a part of this patient's care.  Luisa HartChristy, Lynanne Delgreco D 06/22/2016 10:06 AM

## 2016-06-22 NOTE — Discharge Summary (Addendum)
[facility] [place] [facility]   PATIENT NAME: Jesus Morton    MR#:  [id number]  DATE OF BIRTH:  1955-08-26  DATE OF ADMISSION:  06/20/2016 ADMITTING PHYSICIAN: Jon Gills Hugelmeyer, DO  DATE OF DISCHARGE: No discharge date for patient encounter.  PRIMARY CARE PHYSICIAN: [name], MD     ADMISSION DIAGNOSIS:  Aspiration pneumonia of right lower lobe, unspecified aspiration pneumonia type (HCC) [J69.0]  DISCHARGE DIAGNOSIS:  Active Problems:   Aspiration pneumonia of right lower lobe (HCC)   Aspiration of foreign body   Aspiration pneumonia (HCC)   Foreign body aspiration   Pneumonitis due to food and vomit (HCC)   Problems with swallowing and mastication   Foreign body alimentary tract   Abnormal findings on esophagogastroduodenoscopy (EGD)   Hemorrhagic esophagitis   Hypotension   Cough   SECONDARY DIAGNOSIS:  History reviewed. No pertinent past medical history.  .pro HOSPITAL COURSE:  The patient is a 60 year old Caucasian male with past medical history significant history of Down syndrome, dementia, who presents to the hospital with cough, respiratory distress, after swallowing foreign body, wrapper around the cupcake. CT scan of chest revealed impacted food particle in distal aspect of esophagus. The patient underwent EGD , 06/21/2016 by Dr. Servando Snare, revealing hemorrhagic esophagitis, bilateral multifocal pneumonia, concerning for aspiration pneumonitis. Patient was initiated on Protonix, Unasyn, nebulizing therapy, IV fluids, his condition improved, he was weaned off oxygen. He was evaluated by speech therapist and recommended dysphagia 1 diet. He was felt to be stable to be discharged home today Discussion by problem:  #1. Acute respiratory failure with hypoxia due to aspiration pneumonitis, weaned off oxygen therapy ., O2 sats 95% on room air on the day of discharge. #2. Aspiration pneumonitis, the patient was managed on  intravenous Unasyn while in the hospital, antibiotic is changed to Augmentin orally upon discharge, patient is to continue antibiotics for 7 more days to complete course, follow clinically, patient is to follow-up with primary care physician within one week after discharge. #3. Foreign body in esophagus, status post EGD, 06/21/2016 by Dr. Servando Snare, hemorrhagic esophagitis noted, the patient is to continue proton pump inhibitor, Carafate as outpatient, he is to follow up with gastroenterologist as outpatient. It is recommended to keep patient's head at the 30 angle, do not allow patient to be slumped in bed to facilitate esophagitis healing. #4. Hypotension, the patient was given IV fluid bolus, continued on maintenance IV fluids , blood pressure improved #5. Cough. Supportive therapy #6. Dysphagia, speech therapist evaluation was completed, diet was  recommended as dysphagia level 1 - PUREE consistency; Thin liquids; strict Aspiration precautions. Pills given w/ a Puree if easier, safer for swallowing. Monitoring at all meals for any impulsive eating behaviors - slow down, smaller bites, alternate food/liquid. REFLUX/GERD Precautions , it is recommended for speech therapist to follow the patient as out patient closely and advance diet as tolerated #7. Generalized weakness, outpatient physical therapy is recommended DISCHARGE CONDITIONS:   Stable  CONSULTS OBTAINED:  Treatment Team:  [name], MD  DRUG ALLERGIES:  No Known Allergies  DISCHARGE MEDICATIONS:   Current Discharge Medication List    START taking these medications   Details  amoxicillin-clavulanate (AUGMENTIN ES-600) 600-42.9 MG/5ML suspension Take 5 mLs (600 mg total) by mouth 3 (three) times daily. Qty: 200 mL, Refills: 0    artificial tears (LACRILUBE) OINT ophthalmic ointment Place into both eyes at bedtime. 0.25 inch strip to both eyes at bedtime Qty: 1 Tube, Refills:  6    dextromethorphan (DELSYM) 30 MG/5ML liquid Take 5  mLs (30 mg total) by mouth 2 (two) times daily. Qty: 89 mL, Refills: 0    guaiFENesin (MUCINEX) 600 MG 12 hr tablet Take 1 tablet (600 mg total) by mouth 2 (two) times daily. Qty: 20 tablet, Refills: 0    hydroxypropyl methylcellulose / hypromellose (ISOPTO TEARS / GONIOVISC) 2.5 % ophthalmic solution Place 1 drop into both eyes every 2 (two) hours while awake. Qty: 15 mL, Refills: 12    pantoprazole (PROTONIX) 40 MG tablet Take 1 tablet (40 mg total) by mouth daily. Qty: 30 tablet, Refills: 5      CONTINUE these medications which have CHANGED   Details  LORazepam (ATIVAN) 0.5 MG tablet Take 1 tablet (0.5 mg total) by mouth daily as needed for anxiety. Qty: 30 tablet, Refills: 0      CONTINUE these medications which have NOT CHANGED   Details  acetaminophen (TYLENOL) 500 MG tablet Take 500 mg by mouth every 6 (six) hours as needed.    acidophilus (RISAQUAD) CAPS capsule Take 1 capsule by mouth daily.    calcium-vitamin D (OSCAL WITH D) 500-200 MG-UNIT tablet Take 1 tablet by mouth 2 (two) times daily.    donepezil (ARICEPT) 5 MG tablet Take 5 mg by mouth at bedtime.    loratadine (CLARITIN) 10 MG tablet Take 10 mg by mouth daily.    Melatonin 3 MG TABS Take 1 tablet by mouth daily.    memantine (NAMENDA) 10 MG tablet Take 10 mg by mouth daily.    mupirocin ointment (BACTROBAN) 2 % Place 1 application into the nose 2 (two) times daily. Prn signs of cellulitis    Naftifine HCl (NAFTIN) 1 % GEL Apply topically. Between toes    nystatin-triamcinolone (MYCOLOG II) cream Apply 1 application topically 2 (two) times daily. Affected area of elbox flexor and groin    sodium chloride (OCEAN) 0.65 % SOLN nasal spray Place 1 spray into both nostrils as needed for congestion.    sucralfate (CARAFATE) 1 GM/10ML suspension Take 1 g by mouth 4 (four) times daily. With meals and bedtime    vitamin B-12 (CYANOCOBALAMIN) 1000 MCG tablet Take 1,000 mcg by mouth daily.      STOP taking  these medications     omeprazole (PRILOSEC) 20 MG capsule      penicillin v potassium (VEETID) 250 MG/5ML solution          DISCHARGE INSTRUCTIONS:    Patient is to follow-up with primary care physician within one week after discharge  If you experience worsening of your admission symptoms, develop shortness of breath, life threatening emergency, suicidal or homicidal thoughts you must seek medical attention immediately by calling 911 or calling your MD immediately  if symptoms less severe.  You Must read complete instructions/literature along with all the possible adverse reactions/side effects for all the Medicines you take and that have been prescribed to you. Take any new Medicines after you have completely understood and accept all the possible adverse reactions/side effects.   Please note  You were cared for by a hospitalist during your hospital stay. If you have any questions about your discharge medications or the care you received while you were in the hospital after you are discharged, you can call the unit and asked to speak with the hospitalist on call if the hospitalist that took care of you is not available. Once you are discharged, your primary care physician will handle any further medical  issues. Please note that NO REFILLS for any discharge medications will be authorized once you are discharged, as it is imperative that you return to your primary care physician (or establish a relationship with a primary care physician if you do not have one) for your aftercare needs so that they can reassess your need for medications and monitor your lab values.    Today   CHIEF COMPLAINT:   Chief Complaint  Patient presents with  . Cough  . Respiratory Distress  . Swallowed Foreign Body    HISTORY OF PRESENT ILLNESS:  Jesus Morton  is a 60 y.o. male with a known history of Down syndrome, dementia, who presents to the hospital with cough, respiratory distress, after  swallowing foreign body, wrapper around the cupcake. CT scan of chest revealed impacted food particle in distal aspect of esophagus. The patient underwent EGD , 06/21/2016 by Dr. Servando Snare, revealing hemorrhagic esophagitis, bilateral multifocal pneumonia, concerning for aspiration pneumonitis. Patient was initiated on Protonix, Unasyn, nebulizing therapy, IV fluids, his condition improved, he was weaned off oxygen. He was evaluated by speech therapist and recommended dysphagia 1 diet. He was felt to be stable to be discharged home today  Discussion by problem:   #1. Acute respiratory failure with hypoxia due to aspiration pneumonitis, weaned off oxygen therapy ., O2 sats 95% on room air on the day of discharge. #2. Aspiration pneumonitis, the patient was managed on intravenous Unasyn while in the hospital, antibiotic is changed to Augmentin orally upon discharge, patient is to continue antibiotics for 7 more days to complete course, follow clinically, patient is to follow-up with primary care physician within one week after discharge. #3. Foreign body in esophagus, status post EGD, 06/21/2016 by Dr. Servando Snare, hemorrhagic esophagitis noted, the patient is to continue proton pump inhibitor, Carafate as outpatient, he is to follow up with gastroenterologist as outpatient. It is recommended to keep patient's head at the 30 angle, do not allow patient to be slumped in bed to facilitate esophagitis healing. #4. Hypotension, the patient was given IV fluid bolus, continued on maintenance IV fluids , blood pressure improved #5. Cough. Supportive therapy #6. Dysphagia, speech therapist evaluation was completed, diet as  recommended as dysphagia level 1 - PUREE consistency; Thin liquids; strict Aspiration precautions. Pills given w/ a Puree if easier, safer for swallowing. Monitoring at all meals for any impulsive eating behaviors - slow down, smaller bites, alternate food/liquid. REFLUX/GERD Precautions, it is recommended  for speech therapist to follow the patient as out patient closely and advance diet as tolerated #7. Generalized weakness, outpatient physical therapy is recommended    VITAL SIGNS:  Blood pressure 112/65, pulse 72, temperature 98.4 F (36.9 C), temperature source Oral, resp. rate 18, height 5' (1.524 m), weight 45.9 kg (101 lb 4.8 oz), SpO2 94 %.  I/O:    Intake/Output Summary (Last 24 hours) at 06/22/16 1358 Last data filed at 06/22/16 1348  Gross per 24 hour  Intake             1560 ml  Output                0 ml  Net             1560 ml    PHYSICAL EXAMINATION:  GENERAL:  60 y.o.-year-old patient lying in the bed with no acute distress.  EYES: Pupils equal, round, reactive to light and accommodation. No scleral icterus. Extraocular muscles intact.  HEENT: Head atraumatic, normocephalic. Oropharynx and nasopharynx clear.  NECK:  Supple, no jugular venous distention. No thyroid enlargement, no tenderness.  LUNGS: Normal breath sounds bilaterally, no wheezing, rales,rhonchi or crepitation. No use of accessory muscles of respiration.  CARDIOVASCULAR: S1, S2 normal. No murmurs, rubs, or gallops.  ABDOMEN: Soft, non-tender, non-distended. Bowel sounds present. No organomegaly or mass.  EXTREMITIES: No pedal edema, cyanosis, or clubbing.  NEUROLOGIC: Cranial nerves II through XII are intact. Muscle strength 5/5 in all extremities. Sensation intact. Gait not checked.  PSYCHIATRIC: The patient is alert and oriented x 3.  SKIN: No obvious rash, lesion, or ulcer.   DATA REVIEW:   CBC  Recent Labs Lab 06/22/16 0458  WBC 13.9*  HGB 13.2  HCT 39.7*  PLT 225    Chemistries   Recent Labs Lab 06/21/16 0501  NA 141  K 3.9  CL 108  CO2 25  GLUCOSE 132*  BUN 18  CREATININE 1.16  CALCIUM 8.1*    Cardiac Enzymes No results for input(s): TROPONINI in the last 168 hours.  Microbiology Results  Results for orders placed or performed during the hospital encounter of 06/20/16   MRSA PCR Screening     Status: None   Collection Time: 06/21/16  4:27 AM  Result Value Ref Range Status   MRSA by PCR NEGATIVE NEGATIVE Final    Comment:        The GeneXpert MRSA Assay (FDA approved for NASAL specimens only), is one component of a comprehensive MRSA colonization surveillance program. It is not intended to diagnose MRSA infection nor to guide or monitor treatment for MRSA infections.     RADIOLOGY:  Dg Chest 2 View  Result Date: 06/20/2016 CLINICAL DATA:  Patient presents to the ED with coughing and "gurgling" and having difficulty breathing. Patient presents to the ED with staff from [facility]. Staff states that patient was at a day program and while there staff reported that patient "ate a paper towel". Since then patient has been coughing. Patient has history of down syndrome and dementia. EXAM: CHEST  2 VIEW COMPARISON:  None. FINDINGS: The cardiac silhouette is normal in size and configuration. No mediastinal or hilar masses. No convincing adenopathy. Lungs are hyperexpanded. There is opacity at the right lung base. This is likely atelectasis. Infection is possible. Lungs are otherwise clear. No pleural effusion.  No pneumothorax. Skeletal structures are demineralized. There is a scoliosis of the thoracolumbar spine. IMPRESSION: 1. Right basilar lung opacity most likely atelectasis. Consider pneumonia if there are consistent clinical symptoms. 2. Lungs hyperexpanded but otherwise clear. No other evidence of an acute abnormality. Electronically Signed   By: Amie Portlandavid  Ormond M.D.   On: 06/20/2016 19:20   Ct Chest Wo Contrast  Result Date: 06/21/2016 CLINICAL DATA:  60 year old male for with history of Down's syndrome and dementia. Patient was in the emergency room for evaluation of aspiration and reported eating cupcake and bite off some wrapper paper. Patient is going for bronchoscopy is correlate EXAM: CT CHEST WITHOUT CONTRAST TECHNIQUE: Multidetector  CT imaging of the chest was performed following the standard protocol without IV contrast. COMPARISON:  Chest radiograph dated 06/20/2016 FINDINGS: Evaluation is limited due to respiratory motion artifact as well as in the absence of intravenous contrast. Cardiovascular: There is no cardiomegaly or pericardial effusion. There is coronary vascular calcification. The aorta and central pulmonary arteries are grossly unremarkable on this noncontrast study. Mediastinum/Nodes: There is no hilar or mediastinal adenopathy. There is a small hiatal hernia. There is thickened appearance of the gastroesophageal junction.  There is a 2.2 x 2.4 cm food particle in the distal esophagus concerning for impacted food at the gastroesophageal junction. The esophagus is mildly distended. Liquid content noted throughout the esophagus. Layering high attenuating content in the distal esophagus noted. Lungs/Pleura: There are confluence nodular and ground-glass density predominantly involving the right upper, right middle and right lower lobe. Left lower lobe and lingular nodular density is also noted. Findings are concerning for aspiration pneumonia. There is no pleural effusion or pneumothorax. The central airways are patent. No definite foreign object noted within the trachea or main bronchi. Upper Abdomen: Small nonobstructing bilateral renal calculi. The visualized upper abdomen is otherwise unremarkable. Musculoskeletal: Mild diffuse subcutaneous edema. There is scoliosis with degenerative changes of the thoracic spine. No acute fracture. IMPRESSION: Bilateral confluent nodular and ground-glass airspace opacities most compatible with multifocal pneumonia likely aspiration pneumonia. Recommend swallow study by speech pathology. No foreign object identified within the major airways. Small hiatal hernia with thickened appearance of the gastroesophageal junction. Probable impacted food particle in the distal esophagus at the  gastroesophageal junction. There is dilatation of the esophagus with fluid content. Endoscopy is recommended to evaluate the gastroesophageal thickening. Electronically Signed   By: Elgie CollardArash  Radparvar M.D.   On: 06/21/2016 06:55    EKG:   Orders placed or performed in visit on 11/14/12  . EKG 12-Lead      Management plans discussed with the patient, family and they are in agreement.  CODE STATUS:     Code Status Orders        Start     Ordered   06/20/16 2213  Full code  Continuous     06/20/16 2212    Code Status History    Date Active Date Inactive Code Status Order ID Comments User Context   This patient has a current code status but no historical code status.      TOTAL TIME TAKING CARE OF THIS PATIENT: 40 minutes.    Katharina CaperVAICKUTE,Jaisha Villacres M.D on 06/22/2016 at 1:58 PM  Between 7am to 6pm - Pager - [phone]  After 6pm go to [email or web address] - password [email or web address] [facility]  [facility]  Office  [phone]  CC: Primary care physician; Pasty Spillersracy N McLean-Scocuzza, MD

## 2016-06-22 NOTE — Progress Notes (Signed)
[facility] here to [facility] pt. [place] [facility] [facility] [place].

## 2016-06-22 NOTE — Progress Notes (Signed)
Patient is medically stable for D/[facility]. Per Lupita Leashonna RN for [facility] patient can't have home health because it is a duplication of services. Per Sharman Crateonna [name] has a RN and SLP on site to monitor patient. Lupita LeashDonna requested outpatient PT order, which was put in D/C packet. Per Lupita Leashonna she will send a group home staff member to pick patient up today between 1-2. Clinical Child psychotherapistocial Worker (CSW) sent D/C Summary and FL2 to [facility]. RN will call report to Lupita LeashDonna. Patient's guardian/ brother Francis DowseJoel is aware of above. Patient's sister Marylu LundJanet is at bedside and aware of above. Please reconsult if future social work needs arise. CSW signing off.   Baker Hughes IncorporatedBailey Orbin Mayeux, LCSW [phone]

## 2016-06-22 NOTE — Evaluation (Signed)
Clinical/Bedside Swallow Evaluation Patient Details  Name: Jesus Morton MRN: [id number] Date of Birth: 05/03/1956  Today's Date: 06/22/2016 Time: SLP Start Time (ACUTE ONLY): 0900 SLP Stop Time (ACUTE ONLY): 1000 SLP Time Calculation (min) (ACUTE ONLY): 60 min  Past Medical History: History reviewed. No pertinent past medical history. Past Surgical History: History reviewed. No pertinent surgical history. HPI:  Pt is a 60 y.o. male with a known history of Down syndrome, GERD, and dementia presents to the emergency department for evaluation of aspiration. Patient reportedly was eating a cupcake when he bit off some of the wrapper around the cupcake. He immediately began having coughing with difficulty breathing. On my questioning he denies any symptoms of cough, shortness of breath, chest pain, palpitations. Patient had a CT Chest done completed - Small hiatal hernia with thickened appearance of the gastroesophageal junction. Probable impacted food in the distal esophagus at the gastroesophageal junction. GI consulted and EGD on 06/21/2016 revealed Hemorrhagic Esophagitis. BSE ordered. Per Sister in room, pt has been on a Pureed (dysphagia) diet in the past but recently has been eating a regular diet. She endorses impulsive eating behaviors at the Group Home.   Assessment / Plan / Recommendation Clinical Impression  Pt appears to present w/ min increased risk for aspiration secondary to his impulsive eating behaviors baseline (mild MR) and now increased congested/phlegm impacting his respiratory status/upper airway. The delayed cough noted may have been more related to the baseline respiratory status and increased Esophageal phlegm as no decline in respiratory status or change in vocal quality was noted post trials of thin liquids. Pt consumed trials of puree w/ similar presentation. Pt fed self w/ setup assist. Education was given to Sister on pt's swallowing presentation, risk for aspiration,  impulsive eating/drinking behavior and its impact on swallowing; food consistency recommendations. Pt was given verbal instruction on following aspiration precautions and modeled how to take single sips for SLP. Due to pt's mild MR and min increased risk for aspiration, recommend a Dysphagia 1 w/ thin liquids diet - NO straw is best; aspiration precautions; f/u by skilled ST services for monitoring of toleration of diet and need to modify liquid consistency to Nectar consistency as well as need for MBSS if increased suspicion of aspiration occurs. Recommend ongoing education w/ staff on aspiration precautions and food consistency preparation. Recommend Dietician consult for diet/supplements.     Aspiration Risk  Mild aspiration risk    Diet Recommendation  Dysphagia 1 w/ Thin liquids - NO straws. Aspiration precautions; monitoring during meals for impulsivity w/ eating/drinking.   Medication Administration: Whole meds with puree (or crushed if necessary and able to)    Other  Recommendations Recommended Consults: Consider GI evaluation (ongoing f/u for management) Oral Care Recommendations: Oral care BID;Staff/trained caregiver to provide oral care   Follow up Recommendations Home health SLP (at Group Home)      Frequency and Duration min 3x week  2 weeks       Prognosis Prognosis for Safe Diet Advancement: Fair Barriers to Reach Goals: Cognitive deficits;Behavior      Swallow Study   General Date of Onset: 06/20/16 HPI: Pt is a 60 y.o. male with a known history of Down syndrome, GERD, and dementia presents to the emergency department for evaluation of aspiration. Patient reportedly was eating a cupcake when he bit off some of the wrapper around the cupcake. He immediately began having coughing with difficulty breathing. On my questioning he denies any symptoms of cough, shortness of breath, chest  pain, palpitations. Patient had a CT Chest done completed - Small hiatal hernia with  thickened appearance of the gastroesophageal junction. Probable impacted food in the distal esophagus at the gastroesophageal junction. GI consulted and EGD on 06/21/2016 revealed Hemorrhagic Esophagitis. BSE ordered. Per Sister in room, pt has been on a Pureed (dysphagia) diet in the past but recently has been eating a regular diet. She endorses impulsive eating behaviors at the Group Home. Type of Study: Bedside Swallow Evaluation Diet Prior to this Study: Regular;Thin liquids (per reprot) Temperature Spikes Noted: No (wbc elevated) Respiratory Status: Room air History of Recent Intubation: No Behavior/Cognition: Alert;Cooperative;Pleasant mood (mild MR) Oral Cavity Assessment: Dry Oral Care Completed by SLP: Recent completion by staff Oral Cavity - Dentition: Poor condition;Missing dentition Vision: Functional for self-feeding Self-Feeding Abilities: Able to feed self;Needs assist;Needs set up Patient Positioning: Upright in bed Baseline Vocal Quality: Low vocal intensity (muttered/mumbled speech) Volitional Cough: Congested (min) Volitional Swallow: Able to elicit    Oral/Motor/Sensory Function Overall Oral Motor/Sensory Function: Within functional limits (incomplete assessment secondary to attention to task)   Ice Chips Ice chips: Within functional limits Presentation: Spoon (fed; 2 trials) Other Comments: "no ice"   Thin Liquid Thin Liquid: Impaired Presentation: Cup;Self Fed (8 trials total) Oral Phase Impairments:  (min impulsive) Pharyngeal  Phase Impairments: Cough - Delayed (x2) Other Comments: unsure if related to any potential laryngeal penetration as pt has congested respirations and phlegm baseline (post EGD?)     Nectar Thick Nectar Thick Liquid: Not tested   Honey Thick Honey Thick Liquid: Not tested   Puree Puree: Within functional limits Presentation: Self Fed;Spoon (~3 ozs) Other Comments: delayed, mild cough x1   Solid   GO   Solid: Not tested Other Comments:  can assess during f/u by Rehab at Group Home    Functional Assessment Tool Used: clinical judgement Functional Limitations: Swallowing Swallow Current Status ([id number]): At least 20 percent but less than 40 percent impaired, limited or restricted Swallow Goal Status [phone]([id number]): At least 20 percent but less than 40 percent impaired, limited or restricted Swallow Discharge Status [phone]([id number]): At least 1 percent but less than 20 percent impaired, limited or restricted     Jerilynn SomKatherine Watson, MS, CCC-SLP Watson,Katherine 06/22/2016,2:16 PM

## 2016-06-22 NOTE — Progress Notes (Signed)
Message left for Lupita LeashDonna at [facility]

## 2016-06-22 NOTE — NC FL2 (Signed)
[place] MEDICAID FL2 LEVEL OF CARE SCREENING TOOL     IDENTIFICATION  Patient Name: Jesus LittlerMarshall Pranger Birthdate: 10/28/1955 Sex: male Admission Date (Current Location): 06/20/2016  [facility] [place]  [place]  ([id number]) Facility and Address:  [facility], [address], [place], [place] [address]      Provider Number: [id number]  Attending Physician Name and Address:  Katharina Caperima Rocko Fesperman, MD  Relative Name and Phone Number:       Current Level of Care: Hospital Recommended Level of Care: Other (Comment) (Group Home. ) Prior Approval Number:    Date Approved/Denied:   PASRR Number:    Discharge Plan: Other (Comment) (Group Home. )    Current Diagnoses: Patient Active Problem List   Diagnosis Date Noted  . Foreign body alimentary tract 06/22/2016  . Abnormal findings on esophagogastroduodenoscopy (EGD) 06/22/2016  . Hemorrhagic esophagitis 06/22/2016  . Hypotension 06/22/2016  . Aspiration pneumonia (HCC) 06/22/2016  . Foreign body aspiration   . Cough   . Pneumonitis due to food and vomit (HCC)   . Problems with swallowing and mastication   . Aspiration of foreign body   . Aspiration pneumonia of right lower lobe (HCC) 06/20/2016    Orientation RESPIRATION BLADDER Height & Weight     Self  Normal Continent Weight: 101 lb 4.8 oz (45.9 kg) Height:  5' (152.4 cm)  BEHAVIORAL SYMPTOMS/MOOD NEUROLOGICAL BOWEL NUTRITION STATUS   (none)  (none) Continent Dysphagia level 1 - PUREE consistency; Thin liquids; strict Aspiration precautions. Pills given w/ a Puree if easier, safer for swallowing. Monitoring at all meals for any impulsive eating behaviors - slow down, smaller bites, alternate food/liquidREFLUX/GERD Precautions.   AMBULATORY STATUS COMMUNICATION OF NEEDS Skin   Independent Verbally Normal                       Personal Care Assistance Level of Assistance  Bathing, Feeding, Dressing Bathing Assistance: Limited  assistance Feeding assistance: Independent Dressing Assistance: Limited assistance     Functional Limitations Info  Sight, Hearing, Speech Sight Info: Adequate Hearing Info: Adequate Speech Info: Adequate    SPECIAL CARE FACTORS FREQUENCY    Outpatient PT  [facility] RN and SLP to follow.                     Contractures      Additional Factors Info  Code Status, Allergies Code Status Info:  (Full Code. ) Allergies Info:  (No Known Allergies. )          Discharge Medications: Please see discharge summary for a list of discharge medications. Current Discharge Medication List        START taking these medications   Details  amoxicillin-clavulanate (AUGMENTIN ES-600) 600-42.9 MG/5ML suspension Take 5 mLs (600 mg total) by mouth 3 (three) times daily. Qty: 200 mL, Refills: 0    artificial tears (LACRILUBE) OINT ophthalmic ointment Place into both eyes at bedtime. 0.25 inch strip to both eyes at bedtime Qty: 1 Tube, Refills: 6    dextromethorphan (DELSYM) 30 MG/5ML liquid Take 5 mLs (30 mg total) by mouth 2 (two) times daily. Qty: 89 mL, Refills: 0    guaiFENesin (MUCINEX) 600 MG 12 hr tablet Take 1 tablet (600 mg total) by mouth 2 (two) times daily. Qty: 20 tablet, Refills: 0    hydroxypropyl methylcellulose / hypromellose (ISOPTO TEARS / GONIOVISC) 2.5 % ophthalmic solution Place 1 drop into both eyes every 2 (two) hours while  awake. Qty: 15 mL, Refills: 12    pantoprazole (PROTONIX) 40 MG tablet Take 1 tablet (40 mg total) by mouth daily. Qty: 30 tablet, Refills: 5          CONTINUE these medications which have CHANGED   Details  LORazepam (ATIVAN) 0.5 MG tablet Take 1 tablet (0.5 mg total) by mouth daily as needed for anxiety. Qty: 30 tablet, Refills: 0          CONTINUE these medications which have NOT CHANGED   Details  acetaminophen (TYLENOL) 500 MG tablet Take 500 mg by mouth every 6 (six) hours as needed.    acidophilus  (RISAQUAD) CAPS capsule Take 1 capsule by mouth daily.    calcium-vitamin D (OSCAL WITH D) 500-200 MG-UNIT tablet Take 1 tablet by mouth 2 (two) times daily.    donepezil (ARICEPT) 5 MG tablet Take 5 mg by mouth at bedtime.    loratadine (CLARITIN) 10 MG tablet Take 10 mg by mouth daily.    Melatonin 3 MG TABS Take 1 tablet by mouth daily.    memantine (NAMENDA) 10 MG tablet Take 10 mg by mouth daily.    mupirocin ointment (BACTROBAN) 2 % Place 1 application into the nose 2 (two) times daily. Prn signs of cellulitis    Naftifine HCl (NAFTIN) 1 % GEL Apply topically. Between toes    nystatin-triamcinolone (MYCOLOG II) cream Apply 1 application topically 2 (two) times daily. Affected area of elbox flexor and groin    sodium chloride (OCEAN) 0.65 % SOLN nasal spray Place 1 spray into both nostrils as needed for congestion.    sucralfate (CARAFATE) 1 GM/10ML suspension Take 1 g by mouth 4 (four) times daily. With meals and bedtime    vitamin B-12 (CYANOCOBALAMIN) 1000 MCG tablet Take 1,000 mcg by mouth daily.         STOP taking these medications     omeprazole (PRILOSEC) 20 MG capsule      penicillin v potassium (VEETID) 250 MG/5ML solution       Relevant Imaging Results: Relevant Lab Results: Additional Information  (SSN: [id number])  Sample, Darleen CrockerBailey M, LCSW

## 2016-06-22 NOTE — Care Management Note (Signed)
Case Management Note  Patient Details  Name: Jesus Morton MRN: [id number] Date of Birth: 03-13-1956  Subjective/Objective:   Spoke with brother Marthe PatchJoel Delmonaco [phone]). Explained Medicare Outpatient Observation Notice. He verbalized understanding. Copy placed in room.  Attending ordered wants RN for assessment of lungs, facilty has a Charity fundraiserN. She also wants OP PT. Order sent with discharge packet. The facility has SLP on site for further evaluation.                Action/Plan: Facility has been updated with discharge needs by CSW. RNCM present.   Expected Discharge Date:  06/22/16               Expected Discharge Plan:  Group Home  In-House Referral:     Discharge planning Services  CM Consult  Post Acute Care Choice:    Choice offered to:     DME Arranged:    DME Agency:     HH Arranged:    HH Agency:     Status of Service:  Completed, signed off  If discussed at [employer] Length of Stay Meetings, dates discussed:    Additional Comments:  Marily MemosLisa M Raimundo [name], RN 06/22/2016, 9:38 AM

## 2016-06-22 NOTE — Care Management Obs Status (Signed)
MEDICARE OBSERVATION STATUS NOTIFICATION   Patient Details  Name: Jesus Morton MRN: [id number] Date of Birth: 26-Nov-1955   Medicare Observation Status Notification Given:  Yes    Marily MemosLisa M Hetty Linhart, RN 06/22/2016, 9:35 AM

## 2016-06-22 NOTE — Progress Notes (Signed)
Report given to Lupita LeashDonna at [facility], including diet instructions. Lupita LeashDonna verbalized understanding.

## 2016-06-23 ENCOUNTER — Encounter: Payer: Self-pay | Admitting: Gastroenterology

## 2016-06-23 NOTE — Anesthesia Postprocedure Evaluation (Signed)
Anesthesia Post Note  Patient: Jesus Morton  Procedure(s) Performed: Procedure(s) (LRB): ESOPHAGOGASTRODUODENOSCOPY (EGD) WITH PROPOFOL (N/A)  Patient location during evaluation: PACU Anesthesia Type: General Level of consciousness: awake and alert Pain management: pain level controlled Vital Signs Assessment: post-procedure vital signs reviewed and stable Respiratory status: spontaneous breathing, nonlabored ventilation, respiratory function stable and patient connected to nasal cannula oxygen Cardiovascular status: blood pressure returned to baseline and stable Postop Assessment: no signs of nausea or vomiting Anesthetic complications: no    Last Vitals:  Vitals:   06/22/16 0749 06/22/16 1256  BP: 112/62 112/65  Pulse: 90 72  Resp: 18 18  Temp:  36.9 C    Last Pain:  Vitals:   06/22/16 1256  TempSrc: Oral  PainSc:                  Yevette EdwardsJames G Kesley Gaffey

## 2016-07-19 ENCOUNTER — Other Ambulatory Visit: Payer: Self-pay

## 2016-07-19 ENCOUNTER — Ambulatory Visit (INDEPENDENT_AMBULATORY_CARE_PROVIDER_SITE_OTHER): Payer: Medicare Other | Admitting: Gastroenterology

## 2016-07-19 ENCOUNTER — Ambulatory Visit: Payer: Medicare Other | Admitting: Gastroenterology

## 2016-07-19 ENCOUNTER — Encounter: Payer: Self-pay | Admitting: Gastroenterology

## 2016-07-19 VITALS — BP 113/61 | HR 86 | Ht 60.0 in | Wt 104.0 lb

## 2016-07-19 DIAGNOSIS — R1310 Dysphagia, unspecified: Secondary | ICD-10-CM

## 2016-07-19 DIAGNOSIS — F0390 Unspecified dementia without behavioral disturbance: Secondary | ICD-10-CM | POA: Insufficient documentation

## 2016-07-19 DIAGNOSIS — Q909 Down syndrome, unspecified: Secondary | ICD-10-CM | POA: Insufficient documentation

## 2016-07-19 MED ORDER — SUCRALFATE 1 GM/10ML PO SUSP: 1.0000 g | Freq: Three times a day (TID) | ORAL | 1 refills | Status: AC

## 2016-07-19 NOTE — Patient Instructions (Signed)
You are scheduled [facility] Monday, [facility]nd @ 10:30am. Please check in at the medical mall registration desk at 10:15am. Pt cannot have anything to eat or drink 3 hours prior to test.   If you need to reschedule this for any reason, please contact central scheduling at [phone].

## 2016-07-19 NOTE — Progress Notes (Signed)
Primary Care Physician: Jesus Spillers McLean-Scocuzza, MD  Primary Gastroenterologist:  Dr. Midge Morton  Chief Complaint  Patient presents with  . Dysphagia    HPI: Jesus Morton is a 61 y.o. male here for follow-up after discharge from the hospital. The patient has a history of Down syndrome and was in the hospital with reports of dysphagia. The patient underwent an upper endoscopy and was found to have hemorrhagic esophagitis without any overt stricture seen. The patient is doing well on a pured diet. He has a history of being dilated in the past for strictures.  Current Outpatient Prescriptions  Medication Sig Dispense Refill  . acetaminophen (TYLENOL) 500 MG tablet Take 500 mg by mouth every 6 (six) hours as needed.    [name] acidophilus (RISAQUAD) CAPS capsule Take 1 capsule by mouth daily.    [name] artificial tears (LACRILUBE) OINT ophthalmic ointment Place into both eyes at bedtime. 0.25 inch strip to both eyes at bedtime 1 Tube 6  . Calcium Carb-Cholecalciferol (CALCIUM-VITAMIN D) 500-200 MG-UNIT tablet Take by mouth.    . calcium carbonate (OS-CAL) 600 MG tablet Calcium 600MG  Oral Tablet QTY: 180 tablet Days: 90 Refills: 3  Written: 05/13/16 Patient Instructions: twice a day    . calcium-vitamin D (OSCAL WITH D) 500-200 MG-UNIT tablet Take 1 tablet by mouth 2 (two) times daily.    [name] donepezil (ARICEPT) 5 MG tablet Take 5 mg by mouth at bedtime.    . hydrocerin (EUCERIN) CREA APPLY TO LOWER EXTREMITIES TWICE DAILY    . hydroxypropyl methylcellulose / hypromellose (ISOPTO TEARS / GONIOVISC) 2.5 % ophthalmic solution Place 1 drop into both eyes every 2 (two) hours while awake. 15 mL 12  . Lactobacillus (ACIDOPHILUS EXTRA STRENGTH) CAPS Acidophilus Extra Strength Oral Capsule QTY: 0 capsule Days: 0 Refills: 0  Written: 05/13/16 Patient Instructions:     . loratadine (CLARITIN) 10 MG tablet Take 10 mg by mouth daily.    [name] LORazepam (ATIVAN) 0.5 MG tablet Take 1 tablet (0.5 mg total) by mouth daily  as needed for anxiety. 30 tablet 0  . Melatonin 3 MG TABS Take 1 tablet by mouth daily.    . memantine (NAMENDA) 10 MG tablet Take 10 mg by mouth daily.    . Multiple Vitamins-Minerals (MULTIVITAMIN ADULT) TABS Multivitamin Adult Oral Tablet QTY: 0 tablet Days: 0 Refills: 0  Written: 05/13/16 Patient Instructions:     . mupirocin ointment (BACTROBAN) 2 % Place 1 application into the nose 2 (two) times daily. Prn signs of cellulitis    . Naftifine HCl (NAFTIN) 1 % GEL Apply topically. Between toes    . nystatin-triamcinolone (MYCOLOG II) cream Apply 1 application topically 2 (two) times daily. Affected area of elbox flexor and groin    . pantoprazole (PROTONIX) 40 MG tablet Take 1 tablet (40 mg total) by mouth daily. 30 tablet 5  . Polyethylene Glycol 3350 (MIRALAX PO) MiraLax Oral Powder QTY: 0  Days: 0 Refills: 0  Written: 05/13/16 Patient Instructions: 17gm prn    . sodium chloride (OCEAN) 0.65 % SOLN nasal spray Place 1 spray into both nostrils as needed for congestion.    . sucralfate (CARAFATE) 1 GM/10ML suspension Take 1 g by mouth 4 (four) times daily. With meals and bedtime    . vitamin B-12 (CYANOCOBALAMIN) 1000 MCG tablet Take 1,000 mcg by mouth daily.    [name] amoxicillin-clavulanate (AUGMENTIN ES-600) 600-42.9 MG/5ML suspension Take 5 mLs (600 mg total) by mouth 3 (three) times daily. (Patient not taking: Reported  on 07/19/2016) 200 mL 0  . dextromethorphan (DELSYM) 30 MG/5ML liquid Take 5 mLs (30 mg total) by mouth 2 (two) times daily. (Patient not taking: Reported on 07/19/2016) 89 mL 0  . guaiFENesin (MUCINEX) 600 MG 12 hr tablet Take 1 tablet (600 mg total) by mouth 2 (two) times daily. (Patient not taking: Reported on 07/19/2016) 20 tablet 0   No current facility-administered medications for this visit.     Allergies as of 07/19/2016  . (No Known Allergies)    ROS:  General: Negative for anorexia, weight loss, fever, chills, fatigue, weakness. ENT: Negative for hoarseness, difficulty  swallowing , nasal congestion. CV: Negative for chest pain, angina, palpitations, dyspnea on exertion, peripheral edema.  Respiratory: Negative for dyspnea at rest, dyspnea on exertion, cough, sputum, wheezing.  GI: See history of present illness. GU:  Negative for dysuria, hematuria, urinary incontinence, urinary frequency, nocturnal urination.  Endo: Negative for unusual weight change.    Physical Examination:   BP 113/61   Pulse 86   Ht 5' (1.524 m)   Wt 104 lb (47.2 kg)   BMI 20.31 kg/m   General: Well-nourished, well-developed in no acute distress.  Eyes: No icterus. Conjunctivae pink.  Extremities: No lower extremity edema. No clubbing or deformities. Neuro: Alert. Skin: Warm and dry, no jaundice.   Psych: Alert and cooperative, normal mood and affect.  Labs:    Imaging Studies: Dg Chest 2 View  Result Date: 06/20/2016 CLINICAL DATA:  Patient presents to the ED with coughing and "gurgling" and having difficulty breathing. Patient presents to the ED with staff from [facility]. Staff states that patient was at a day program and while there staff reported that patient "ate a paper towel". Since then patient has been coughing. Patient has history of down syndrome and dementia. EXAM: CHEST  2 VIEW COMPARISON:  None. FINDINGS: The cardiac silhouette is normal in size and configuration. No mediastinal or hilar masses. No convincing adenopathy. Lungs are hyperexpanded. There is opacity at the right lung base. This is likely atelectasis. Infection is possible. Lungs are otherwise clear. No pleural effusion.  No pneumothorax. Skeletal structures are demineralized. There is a scoliosis of the thoracolumbar spine. IMPRESSION: 1. Right basilar lung opacity most likely atelectasis. Consider pneumonia if there are consistent clinical symptoms. 2. Lungs hyperexpanded but otherwise clear. No other evidence of an acute abnormality. Electronically Signed   By: Amie Portland M.D.   On:  06/20/2016 19:20   Ct Chest Wo Contrast  Result Date: 06/21/2016 CLINICAL DATA:  61 year old male for with history of Down's syndrome and dementia. Patient was in the emergency room for evaluation of aspiration and reported eating cupcake and bite off some wrapper paper. Patient is going for bronchoscopy is correlate EXAM: CT CHEST WITHOUT CONTRAST TECHNIQUE: Multidetector CT imaging of the chest was performed following the standard protocol without IV contrast. COMPARISON:  Chest radiograph dated 06/20/2016 FINDINGS: Evaluation is limited due to respiratory motion artifact as well as in the absence of intravenous contrast. Cardiovascular: There is no cardiomegaly or pericardial effusion. There is coronary vascular calcification. The aorta and central pulmonary arteries are grossly unremarkable on this noncontrast study. Mediastinum/Nodes: There is no hilar or mediastinal adenopathy. There is a small hiatal hernia. There is thickened appearance of the gastroesophageal junction. There is a 2.2 x 2.4 cm food particle in the distal esophagus concerning for impacted food at the gastroesophageal junction. The esophagus is mildly distended. Liquid content noted throughout the esophagus. Layering high  attenuating content in the distal esophagus noted. Lungs/Pleura: There are confluence nodular and ground-glass density predominantly involving the right upper, right middle and right lower lobe. Left lower lobe and lingular nodular density is also noted. Findings are concerning for aspiration pneumonia. There is no pleural effusion or pneumothorax. The central airways are patent. No definite foreign object noted within the trachea or main bronchi. Upper Abdomen: Small nonobstructing bilateral renal calculi. The visualized upper abdomen is otherwise unremarkable. Musculoskeletal: Mild diffuse subcutaneous edema. There is scoliosis with degenerative changes of the thoracic spine. No acute fracture. IMPRESSION: Bilateral  confluent nodular and ground-glass airspace opacities most compatible with multifocal pneumonia likely aspiration pneumonia. Recommend swallow study by speech pathology. No foreign object identified within the major airways. Small hiatal hernia with thickened appearance of the gastroesophageal junction. Probable impacted food particle in the distal esophagus at the gastroesophageal junction. There is dilatation of the esophagus with fluid content. Endoscopy is recommended to evaluate the gastroesophageal thickening. Electronically Signed   By: Elgie CollardArash  Radparvar M.D.   On: 06/21/2016 06:55    Assessment and Plan:   Jesus Morton is a 61 y.o. y/o male who comes in today after being discharged from the hospital. The patient had an upper endoscopy which showed hemorrhagic esophagitis. The patient will be set up for a upper GI/barium swallow to look for a stricture. The patient will also have his Carafate refilled.    Jesus Miniumarren Shaketa Serafin, MD. Clementeen GrahamFACG   Note: This dictation was prepared with Dragon dictation along with smaller phrase technology. Any transcriptional errors that result from this process are unintentional.

## 2016-08-01 ENCOUNTER — Telehealth: Payer: Self-pay | Admitting: Gastroenterology

## 2016-08-01 ENCOUNTER — Ambulatory Visit
Admission: RE | Admit: 2016-08-01 | Discharge: 2016-08-01 | Disposition: A | Discharge status: Home / Self Care | Payer: Medicare Other | Source: Ambulatory Visit | Attending: Gastroenterology | Admitting: Gastroenterology

## 2016-08-01 DIAGNOSIS — R1310 Dysphagia, unspecified: Secondary | ICD-10-CM

## 2016-08-01 NOTE — Telephone Encounter (Signed)
Please fax results from Swallow Test to Pat[facility]ia [facility]onda [facility]noch at [facility] Fax [phone]

## 2016-08-02 NOTE — Telephone Encounter (Signed)
Barium swallow faxed to Patrecia Pourhonda Enoch @ [phone].

## 2016-08-05 ENCOUNTER — Telehealth: Payer: Self-pay

## 2016-08-05 NOTE — Telephone Encounter (Signed)
-----   Message from Midge Miniumarren Wohl, MD sent at 08/02/2016  6:20 AM EST ----- Let the patient's caregivers know that the esophagus showed no sign of any stricture or narrowing.

## 2016-08-05 NOTE — Telephone Encounter (Signed)
Contacted Rhonda, caretaker and she has requested I fax a copy of barium swallow results to group home. Faxed to [phone].

## 2016-09-09 ENCOUNTER — Emergency Department
Admission: EM | Admit: 2016-09-09 | Discharge: 2016-09-09 | Disposition: A | Discharge status: Home / Self Care | Payer: Medicare Other | Attending: Emergency Medicine | Admitting: Emergency Medicine

## 2016-09-09 ENCOUNTER — Encounter: Payer: Self-pay | Admitting: Emergency Medicine

## 2016-09-09 ENCOUNTER — Emergency Department: Payer: Medicare Other

## 2016-09-09 DIAGNOSIS — Y939 Activity, unspecified: Secondary | ICD-10-CM | POA: Insufficient documentation

## 2016-09-09 DIAGNOSIS — Y929 Unspecified place or not applicable: Secondary | ICD-10-CM | POA: Diagnosis not present

## 2016-09-09 DIAGNOSIS — Y999 Unspecified external cause status: Secondary | ICD-10-CM | POA: Insufficient documentation

## 2016-09-09 DIAGNOSIS — W19XXXA Unspecified fall, initial encounter: Secondary | ICD-10-CM

## 2016-09-09 DIAGNOSIS — W01198A Fall on same level from slipping, tripping and stumbling with subsequent striking against other object, initial encounter: Secondary | ICD-10-CM | POA: Insufficient documentation

## 2016-09-09 DIAGNOSIS — S0083XA Contusion of other part of head, initial encounter: Secondary | ICD-10-CM | POA: Diagnosis not present

## 2016-09-09 DIAGNOSIS — Z79899 Other long term (current) drug therapy: Secondary | ICD-10-CM | POA: Insufficient documentation

## 2016-09-09 DIAGNOSIS — S0990XA Unspecified injury of head, initial encounter: Secondary | ICD-10-CM | POA: Diagnosis present

## 2016-09-09 MED ORDER — BACITRACIN ZINC 500 UNIT/GM EX OINT
TOPICAL_OINTMENT | Freq: Two times a day (BID) | CUTANEOUS | Status: DC
  Administered 2016-09-09: 1 via TOPICAL

## 2016-09-09 MED ORDER — LORAZEPAM 2 MG/ML IJ SOLN: 2.0000 mg | Freq: Once | INTRAMUSCULAR | Status: DC

## 2016-09-09 MED ORDER — BACITRACIN ZINC 500 UNIT/GM EX OINT
TOPICAL_OINTMENT | CUTANEOUS | Status: AC
  Administered 2016-09-09: 1 via TOPICAL
  Filled 2016-09-09: qty 0.9

## 2016-09-09 MED ORDER — DIAZEPAM 5 MG/ML IJ SOLN: 5.0000 mg | Freq: Once | INTRAMUSCULAR | Status: DC

## 2016-09-09 MED ORDER — LORAZEPAM 2 MG/ML IJ SOLN
1.0000 mg | Freq: Once | INTRAMUSCULAR | Status: AC
  Administered 2016-09-09: 1 mg via INTRAVENOUS
  Filled 2016-09-09: qty 1

## 2016-09-09 NOTE — ED Provider Notes (Signed)
[facility] Emergency Department Provider Note  ____________________________________________  Time seen: Approximately 6:35 PM  I have reviewed the triage vital signs and the nursing notes.   HISTORY  Chief Complaint Fall    HPI Jesus Morton is a 61 y.o. male with PMH Down syndrome, dementia, and previous facial fractures that presents to the emergency department after falling on face today. Caregiver was present in room when patient fell but did not witness fall. Patient is unable to provide an accurate history of what happened. He currently denies any pain.   History reviewed. No pertinent past medical history.  Patient Active Problem List   Diagnosis Date Noted  . Dementia 07/19/2016  . Down syndrome 07/19/2016  . Foreign body alimentary tract 06/22/2016  . Abnormal findings on esophagogastroduodenoscopy (EGD) 06/22/2016  . Hemorrhagic esophagitis 06/22/2016  . Hypotension 06/22/2016  . Aspiration pneumonia (HCC) 06/22/2016  . Foreign body aspiration   . Cough   . Pneumonitis due to food and vomit (HCC)   . Problems with swallowing and mastication   . Aspiration of foreign body   . Aspiration pneumonia of right lower lobe (HCC) 06/20/2016  . Cellulitis of right hand 12/24/2015  . Reflux esophagitis 03/10/2014  . Hematemesis 02/16/2014  . Inguinal hernia 12/18/2013  . Cellulitis of right arm 11/28/2013  . Multiple facial fractures (HCC) 03/05/2013  . GERD (gastroesophageal reflux disease) 01/28/2013  . Dysphagia 05/31/2012  . Cellulitis and abscess of leg 06/21/2011  . Corns and callosity 04/26/2011  . Onychomycosis due to dermatophyte 04/26/2011  . Osteoporosis 04/29/2010    Past Surgical History:  Procedure Laterality Date  . ESOPHAGOGASTRODUODENOSCOPY (EGD) WITH PROPOFOL N/A 06/21/2016   Procedure: ESOPHAGOGASTRODUODENOSCOPY (EGD) WITH PROPOFOL;  Surgeon: Midge Miniumarren Wohl, MD;  Location: [facility] ENDOSCOPY;  Service: Endoscopy;  Laterality:  N/A;    Prior to Admission medications   Medication Sig Start Date End Date Taking? Authorizing Provider  acetaminophen (TYLENOL) 500 MG tablet Take 500 mg by mouth every 6 (six) hours as needed.    Historical Provider, MD  acidophilus (RISAQUAD) CAPS capsule Take 1 capsule by mouth daily.    Historical Provider, MD  amoxicillin-clavulanate (AUGMENTIN ES-600) 600-42.9 MG/5ML suspension Take 5 mLs (600 mg total) by mouth 3 (three) times daily. Patient not taking: Reported on 07/19/2016 06/22/16   Katharina Caperima Vaickute, MD  artificial tears (LACRILUBE) OINT ophthalmic ointment Place into both eyes at bedtime. 0.25 inch strip to both eyes at bedtime 06/22/16   Katharina Caperima Vaickute, MD  Calcium Carb-Cholecalciferol (CALCIUM-VITAMIN D) 500-200 MG-UNIT tablet Take by mouth.    Historical Provider, MD  calcium carbonate (OS-CAL) 600 MG tablet Calcium 600MG  Oral Tablet QTY: 180 tablet Days: 90 Refills: 3  Written: 05/13/16 Patient Instructions: twice a day 05/13/16   Historical Provider, MD  calcium-vitamin D (OSCAL WITH D) 500-200 MG-UNIT tablet Take 1 tablet by mouth 2 (two) times daily.    Historical Provider, MD  dextromethorphan (DELSYM) 30 MG/5ML liquid Take 5 mLs (30 mg total) by mouth 2 (two) times daily. Patient not taking: Reported on 07/19/2016 06/22/16   Katharina Caperima Vaickute, MD  donepezil (ARICEPT) 5 MG tablet Take 5 mg by mouth at bedtime.    Historical Provider, MD  guaiFENesin (MUCINEX) 600 MG 12 hr tablet Take 1 tablet (600 mg total) by mouth 2 (two) times daily. Patient not taking: Reported on 07/19/2016 06/22/16   Katharina Caperima Vaickute, MD  hydrocerin (EUCERIN) CREA APPLY TO LOWER EXTREMITIES TWICE DAILY 03/23/16   Historical Provider, MD  hydroxypropyl methylcellulose /  hypromellose (ISOPTO TEARS / GONIOVISC) 2.5 % ophthalmic solution Place 1 drop into both eyes every 2 (two) hours while awake. 06/22/16   Katharina Caper, MD  Lactobacillus (ACIDOPHILUS EXTRA STRENGTH) CAPS Acidophilus Extra Strength Oral Capsule QTY: 0  capsule Days: 0 Refills: 0  Written: 05/13/16 Patient Instructions:  05/13/16   Historical Provider, MD  loratadine (CLARITIN) 10 MG tablet Take 10 mg by mouth daily.    Historical Provider, MD  LORazepam (ATIVAN) 0.5 MG tablet Take 1 tablet (0.5 mg total) by mouth daily as needed for anxiety. 06/22/16   Katharina Caper, MD  Melatonin 3 MG TABS Take 1 tablet by mouth daily.    Historical Provider, MD  memantine (NAMENDA) 10 MG tablet Take 10 mg by mouth daily.    Historical Provider, MD  Multiple Vitamins-Minerals (MULTIVITAMIN ADULT) TABS Multivitamin Adult Oral Tablet QTY: 0 tablet Days: 0 Refills: 0  Written: 05/13/16 Patient Instructions:  05/13/16   Historical Provider, MD  mupirocin ointment (BACTROBAN) 2 % Place 1 application into the nose 2 (two) times daily. Prn signs of cellulitis    Historical Provider, MD  Naftifine HCl (NAFTIN) 1 % GEL Apply topically. Between toes    Historical Provider, MD  nystatin-triamcinolone (MYCOLOG II) cream Apply 1 application topically 2 (two) times daily. Affected area of elbox flexor and groin    Historical Provider, MD  pantoprazole (PROTONIX) 40 MG tablet Take 1 tablet (40 mg total) by mouth daily. 06/22/16   Katharina Caper, MD  Polyethylene Glycol 3350 (MIRALAX PO) MiraLax Oral Powder QTY: 0  Days: 0 Refills: 0  Written: 05/13/16 Patient Instructions: 17gm prn 05/13/16   Historical Provider, MD  sodium chloride (OCEAN) 0.65 % SOLN nasal spray Place 1 spray into both nostrils as needed for congestion.    Historical Provider, MD  sucralfate (CARAFATE) 1 GM/10ML suspension Take 1 g by mouth 4 (four) times daily. With meals and bedtime    Historical Provider, MD  sucralfate (CARAFATE) 1 GM/10ML suspension Take 10 mLs (1 g total) by mouth 4 (four) times daily -  with meals and at bedtime. 07/19/16   Midge Minium, MD  vitamin B-12 (CYANOCOBALAMIN) 1000 MCG tablet Take 1,000 mcg by mouth daily.    Historical Provider, MD    Allergies Patient has no known  allergies.  History reviewed. No pertinent family history.  Social History Social History  Substance Use Topics  . Smoking status: Never Smoker  . Smokeless tobacco: Never Used  . Alcohol use No     Review of Systems  Cardiovascular: No chest pain. Respiratory: No SOB. Gastrointestinal: No abdominal pain.  No vomiting.    ____________________________________________   PHYSICAL EXAM:  VITAL SIGNS: ED Triage Vitals  Enc Vitals Group     BP 09/09/16 1710 120/66     Pulse Rate 09/09/16 1710 61     Resp --      Temp 09/09/16 1710 98.3 F (36.8 C)     Temp Source 09/09/16 1710 Oral     SpO2 09/09/16 1710 99 %     Weight 09/09/16 1711 120 lb (54.4 kg)     Height 09/09/16 1711 5\' 2"  (1.575 m)     Head Circumference --      Peak Flow --      Pain Score --      Pain Loc --      Pain Edu? --      Excl. in GC? --      Constitutional: Well appearing and in  no acute distress. Eyes: Conjunctivae are normal. PERRL. EOMI. Head:  ENT:      Ears:      Nose: Blood present in left nasal canal. No septal deviation. No tenderness over nasal bridge.      Mouth/Throat: Mucous membranes are moist.  Neck: No stridor.  Cardiovascular: Normal rate, regular rhythm.  Good peripheral circulation. Respiratory: Normal respiratory effort without tachypnea or retractions. Lungs CTAB. Good air entry to the bases with no decreased or absent breath sounds. Gastrointestinal: Bowel sounds 4 quadrants. Soft and nontender to palpation.  Musculoskeletal: Full range of motion to all extremities. No gross deformities appreciated. Neurologic:  No gross focal neurologic deficits are appreciated.  Skin:  Skin is warm, dry. 1 cm x 1 cm abrasion to upper left forehead.   ____________________________________________   LABS (all labs ordered are listed, but only abnormal results are displayed)  Labs Reviewed - No data to  display ____________________________________________  EKG   ____________________________________________  RADIOLOGY  Ct Head Wo Contrast  Result Date: 09/09/2016 CLINICAL DATA:  Tripped and fell. Left supraorbital soft tissue injury. EXAM: CT HEAD WITHOUT CONTRAST CT MAXILLOFACIAL WITHOUT CONTRAST TECHNIQUE: Multidetector CT imaging of the head and maxillofacial structures were performed using the standard protocol without intravenous contrast. Multiplanar CT image reconstructions of the maxillofacial structures were also generated. COMPARISON:  None. FINDINGS: CT HEAD FINDINGS Brain: Advanced atrophy is present. Ventricles are proportionate to the degree of atrophy. Mild white matter changes are evident. No acute infarct, hemorrhage, or mass lesion is present. The basal ganglia are intact. The brainstem and cerebellum are normal. Vascular: No hyperdense vessel or unexpected calcification. Skull: No focal lytic or blastic lesions are present. Calvarium is within normal limits. Mild left supraorbital scalp soft tissue swelling is present without an underlying fracture. CT MAXILLOFACIAL FINDINGS Osseous: No acute fractures are present. Nasal bones are intact. The mandible is is intact and located. There is lucency surrounding the roots of the residual right-sided mandibular teeth. Advanced degenerative changes are present in the upper cervical spine. No focal lytic or blastic lesions are present. Orbits: Left supraorbital soft tissue swelling is present without underlying fracture. The globes and orbits are otherwise intact. Sinuses: Circumferential mucosal thickening is evident in the maxillary sinuses, right greater than left. The remaining paranasal sinuses are clear. The the mastoid air cells are clear. Soft tissues: No other focal soft tissue injury is evident. IMPRESSION: 1. Left supraorbital soft tissue swelling and hematoma without underlying fracture. 2. Advanced atrophy. 3. No acute intracranial  abnormality. 4. Advanced degenerative changes in the upper cervical spine. 5. Dental disease with periapical lucencies in the right maxilla. 6. Moderate diffuse sinus disease is likely chronic, most severe in the right maxillary sinus. Electronically Signed   By: Marin Roberts M.D.   On: 09/09/2016 19:14   Ct Maxillofacial Wo Contrast  Result Date: 09/09/2016 CLINICAL DATA:  Tripped and fell. Left supraorbital soft tissue injury. EXAM: CT HEAD WITHOUT CONTRAST CT MAXILLOFACIAL WITHOUT CONTRAST TECHNIQUE: Multidetector CT imaging of the head and maxillofacial structures were performed using the standard protocol without intravenous contrast. Multiplanar CT image reconstructions of the maxillofacial structures were also generated. COMPARISON:  None. FINDINGS: CT HEAD FINDINGS Brain: Advanced atrophy is present. Ventricles are proportionate to the degree of atrophy. Mild white matter changes are evident. No acute infarct, hemorrhage, or mass lesion is present. The basal ganglia are intact. The brainstem and cerebellum are normal. Vascular: No hyperdense vessel or unexpected calcification. Skull: No focal lytic or blastic  lesions are present. Calvarium is within normal limits. Mild left supraorbital scalp soft tissue swelling is present without an underlying fracture. CT MAXILLOFACIAL FINDINGS Osseous: No acute fractures are present. Nasal bones are intact. The mandible is is intact and located. There is lucency surrounding the roots of the residual right-sided mandibular teeth. Advanced degenerative changes are present in the upper cervical spine. No focal lytic or blastic lesions are present. Orbits: Left supraorbital soft tissue swelling is present without underlying fracture. The globes and orbits are otherwise intact. Sinuses: Circumferential mucosal thickening is evident in the maxillary sinuses, right greater than left. The remaining paranasal sinuses are clear. The the mastoid air cells are clear. Soft  tissues: No other focal soft tissue injury is evident. IMPRESSION: 1. Left supraorbital soft tissue swelling and hematoma without underlying fracture. 2. Advanced atrophy. 3. No acute intracranial abnormality. 4. Advanced degenerative changes in the upper cervical spine. 5. Dental disease with periapical lucencies in the right maxilla. 6. Moderate diffuse sinus disease is likely chronic, most severe in the right maxillary sinus. Electronically Signed   By: Marin Roberts M.D.   On: 09/09/2016 19:14    ____________________________________________    PROCEDURES  Procedure(s) performed:    Procedures    Medications  bacitracin ointment (1 application Topical Given 09/09/16 2021)  LORazepam (ATIVAN) injection 1 mg (1 mg Intravenous Given 09/09/16 1830)     ____________________________________________   INITIAL IMPRESSION / ASSESSMENT AND PLAN / ED COURSE  Pertinent labs & imaging results that were available during my care of the patient were reviewed by me and considered in my medical decision making (see chart for details).  Review of the [place] CSRS was performed in accordance of the [facility] prior to dispensing any controlled drugs.   Patient's diagnosis is consistent with abrasion after fall. No signs and exam are reassuring. Head CT and maxillofacial CT negative for acute processes. All findings were disclosed to caregiver. He was given Ativan in ED because he would not sit still for CT. Bacitracin was applied to abrasion and wound was dressed. Caregiver states the patient is acting like himself. Patient has not indicated any additional pain to me or caregiver. Patient is to follow up with PCP as directed. Patient is given ED precautions to return to the ED for any worsening or new symptoms.     ____________________________________________  FINAL CLINICAL IMPRESSION(S) / ED DIAGNOSES  Final diagnoses:  Fall, initial encounter  Contusion of face, initial encounter      NEW  MEDICATIONS STARTED DURING THIS VISIT:  Discharge Medication List as of 09/09/2016  8:08 PM          This chart was dictated using voice recognition software/Dragon. Despite best efforts to proofread, errors can occur which can change the meaning. Any change was purely unintentional.    Enid Derry, PA-C 09/09/16 2047    Merrily Brittle, MD 09/09/16 2316

## 2016-09-09 NOTE — ED Triage Notes (Signed)
Pt ems from [facility]/p fall to face. Per caregiver pt tripped and fell forward and hit face. Abrasion above left eye and bloody nose. No other injuries found.

## 2016-10-01 ENCOUNTER — Emergency Department
Admission: EM | Admit: 2016-10-01 | Discharge: 2016-10-01 | Disposition: A | Discharge status: Home / Self Care | Payer: Medicare Other | Attending: Emergency Medicine | Admitting: Emergency Medicine

## 2016-10-01 ENCOUNTER — Emergency Department: Payer: Medicare Other

## 2016-10-01 ENCOUNTER — Encounter: Payer: Self-pay | Admitting: Emergency Medicine

## 2016-10-01 DIAGNOSIS — Z79899 Other long term (current) drug therapy: Secondary | ICD-10-CM | POA: Diagnosis not present

## 2016-10-01 DIAGNOSIS — M25571 Pain in right ankle and joints of right foot: Secondary | ICD-10-CM | POA: Diagnosis not present

## 2016-10-01 DIAGNOSIS — M7981 Nontraumatic hematoma of soft tissue: Secondary | ICD-10-CM | POA: Insufficient documentation

## 2016-10-01 DIAGNOSIS — M25461 Effusion, right knee: Secondary | ICD-10-CM | POA: Diagnosis present

## 2016-10-01 DIAGNOSIS — R52 Pain, unspecified: Secondary | ICD-10-CM

## 2016-10-01 DIAGNOSIS — M25551 Pain in right hip: Secondary | ICD-10-CM | POA: Insufficient documentation

## 2016-10-01 DIAGNOSIS — M79671 Pain in right foot: Secondary | ICD-10-CM

## 2016-10-01 HISTORY — DX: Gastro-esophageal reflux disease without esophagitis: K21.9

## 2016-10-01 HISTORY — DX: Age-related osteoporosis without current pathological fracture: M81.0

## 2016-10-01 HISTORY — DX: Down syndrome, unspecified: Q90.9

## 2016-10-01 HISTORY — DX: Unspecified dementia, unspecified severity, without behavioral disturbance, psychotic disturbance, mood disturbance, and anxiety: F03.90

## 2016-10-01 NOTE — Discharge Instructions (Signed)
There may be a fracture in the great toe on the right side. I would recommend trying to keep him in a stiff wooden soled shoe or cast boot. If he does not want to walk that is okay to his long as he keeps moving his legs. Please have him follow-up with Dr. Hyacinth MeekerMiller the orthopedic surgeon. Call his office on Monday to set up a follow-up appointment in 1-2 weeks. Please keep an eye on the big toe on the right foot. If it gets red swollen or discolored please bring him back. Also please return for any further problems. Use Tylenol 650 mg 4 times a day as needed for pain.

## 2016-10-01 NOTE — ED Provider Notes (Signed)
[facility] Emergency Department Provider Note   ____________________________________________   First MD Initiated Contact with Patient 10/01/16 1435     (approximate)  I have reviewed the triage vital signs and the nursing notes.   HISTORY  Chief Complaint Leg Pain and Joint Swelling   HPI Jesus Morton is a 61 y.o. male who has Down syndrome and dementia. he usually walks by himself. Patient was given a walker recently to help walking that he does not use it. Today however patient does not want to walk. Patient noted have a bruise on the left knee and swelling of the right knee and right ankle. Patient flinches when I touch the right foot.   Past Medical History:  Diagnosis Date  . Dementia   . Down syndrome   . GERD (gastroesophageal reflux disease)   . Osteoporosis     Patient Active Problem List   Diagnosis Date Noted  . Dementia 07/19/2016  . Down syndrome 07/19/2016  . Foreign body alimentary tract 06/22/2016  . Abnormal findings on esophagogastroduodenoscopy (EGD) 06/22/2016  . Hemorrhagic esophagitis 06/22/2016  . Hypotension 06/22/2016  . Aspiration pneumonia (HCC) 06/22/2016  . Foreign body aspiration   . Cough   . Pneumonitis due to food and vomit (HCC)   . Problems with swallowing and mastication   . Aspiration of foreign body   . Aspiration pneumonia of right lower lobe (HCC) 06/20/2016  . Cellulitis of right hand 12/24/2015  . Reflux esophagitis 03/10/2014  . Hematemesis 02/16/2014  . Inguinal hernia 12/18/2013  . Cellulitis of right arm 11/28/2013  . Multiple facial fractures (HCC) 03/05/2013  . GERD (gastroesophageal reflux disease) 01/28/2013  . Dysphagia 05/31/2012  . Cellulitis and abscess of leg 06/21/2011  . Corns and callosity 04/26/2011  . Onychomycosis due to dermatophyte 04/26/2011  . Osteoporosis 04/29/2010    Past Surgical History:  Procedure Laterality Date  . ESOPHAGOGASTRODUODENOSCOPY (EGD) WITH  PROPOFOL N/A 06/21/2016   Procedure: ESOPHAGOGASTRODUODENOSCOPY (EGD) WITH PROPOFOL;  Surgeon: Midge Miniumarren Wohl, MD;  Location: [facility] ENDOSCOPY;  Service: Endoscopy;  Laterality: N/A;    Prior to Admission medications   Medication Sig Start Date End Date Taking? Authorizing Provider  acetaminophen (TYLENOL) 500 MG tablet Take 500 mg by mouth every 6 (six) hours as needed.   Yes Historical Provider, MD  artificial tears (LACRILUBE) OINT ophthalmic ointment Place into both eyes at bedtime. 0.25 inch strip to both eyes at bedtime 06/22/16  Yes Katharina Caperima Vaickute, MD  calcium carbonate (OS-CAL) 600 MG tablet Calcium 600MG  Oral Tablet QTY: 180 tablet Days: 90 Refills: 3  Written: 05/13/16 Patient Instructions: twice a day 05/13/16  Yes Historical Provider, MD  donepezil (ARICEPT) 5 MG tablet Take 5 mg by mouth at bedtime.   Yes Historical Provider, MD  hydroxypropyl methylcellulose / hypromellose (ISOPTO TEARS / GONIOVISC) 2.5 % ophthalmic solution Place 1 drop into both eyes every 2 (two) hours while awake. 06/22/16  Yes Katharina Caperima Vaickute, MD  loratadine (CLARITIN) 10 MG tablet Take 10 mg by mouth daily.   Yes Historical Provider, MD  LORazepam (ATIVAN) 0.5 MG tablet Take 1 tablet (0.5 mg total) by mouth daily as needed for anxiety. 06/22/16  Yes Katharina Caperima Vaickute, MD  Melatonin 3 MG TABS Take 1 tablet by mouth daily.   Yes Historical Provider, MD  memantine (NAMENDA) 10 MG tablet Take 10 mg by mouth daily.   Yes Historical Provider, MD  Multiple Vitamins-Minerals (MULTIVITAMIN ADULT) TABS Multivitamin Adult Oral Tablet QTY: 0 tablet Days: 0 Refills:  0  Written: 05/13/16 Patient Instructions:  05/13/16  Yes Historical Provider, MD  mupirocin ointment (BACTROBAN) 2 % Place 1 application into the nose 2 (two) times daily. Prn signs of cellulitis   Yes Historical Provider, MD  Naftifine HCl (NAFTIN) 1 % GEL Apply topically. Between toes   Yes Historical Provider, MD  nystatin-triamcinolone (MYCOLOG II) cream Apply 1  application topically 2 (two) times daily. Affected area of elbox flexor and groin   Yes Historical Provider, MD  pantoprazole (PROTONIX) 40 MG tablet Take 1 tablet (40 mg total) by mouth daily. 06/22/16  Yes Katharina Caper, MD  Polyethylene Glycol 3350 (MIRALAX PO) MiraLax Oral Powder QTY: 0  Days: 0 Refills: 0  Written: 05/13/16 Patient Instructions: 17gm prn 05/13/16  Yes Historical Provider, MD  sodium chloride (OCEAN) 0.65 % SOLN nasal spray Place 1 spray into both nostrils as needed for congestion.   Yes Historical Provider, MD  sucralfate (CARAFATE) 1 GM/10ML suspension Take 10 mLs (1 g total) by mouth 4 (four) times daily -  with meals and at bedtime. 07/19/16  Yes Midge Minium, MD  vitamin B-12 (CYANOCOBALAMIN) 1000 MCG tablet Take 1,000 mcg by mouth daily.   Yes Historical Provider, MD  amoxicillin-clavulanate (AUGMENTIN ES-600) 600-42.9 MG/5ML suspension Take 5 mLs (600 mg total) by mouth 3 (three) times daily. Patient not taking: Reported on 07/19/2016 06/22/16   Katharina Caper, MD  dextromethorphan (DELSYM) 30 MG/5ML liquid Take 5 mLs (30 mg total) by mouth 2 (two) times daily. Patient not taking: Reported on 07/19/2016 06/22/16   Katharina Caper, MD  guaiFENesin (MUCINEX) 600 MG 12 hr tablet Take 1 tablet (600 mg total) by mouth 2 (two) times daily. Patient not taking: Reported on 07/19/2016 06/22/16   Katharina Caper, MD    Allergies Patient has no known allergies.  No family history on file.  Social History Social History  Substance Use Topics  . Smoking status: Never Smoker  . Smokeless tobacco: Never Used  . Alcohol use No    Review of Systems  Unable to obtain due to dementia ____________________________________________   PHYSICAL EXAM:  VITAL SIGNS: ED Triage Vitals  Enc Vitals Group     BP 10/01/16 1421 106/67     Pulse Rate 10/01/16 1421 76     Resp 10/01/16 1421 20     Temp 10/01/16 1421 98.4 F (36.9 C)     Temp Source 10/01/16 1421 Oral     SpO2 10/01/16 1421 100  %     Weight 10/01/16 1425 120 lb (54.4 kg)     Height 10/01/16 1425 5\' 2"  (1.575 m)     Head Circumference --      Peak Flow --      Pain Score --      Pain Loc --      Pain Edu? --      Excl. in GC? --     Constitutional: Alert . Well appearing and in no acute distress. Eyes: Conjunctivae are normal. PERRL. EOMI. Head: Atraumatic. Nose: No congestion/rhinnorhea. Mouth/Throat: Mucous membranes are moist.  Oropharynx non-erythematous. Neck: No stridor.  Cardiovascular: Normal rate, regular rhythm. Grossly normal heart sounds.  Good peripheral circulation. Respiratory: Normal respiratory effort.  No retractions. Lungs CTAB. Gastrointestinal: Soft and nontender. No distention. No abdominal bruits. No CVA tenderness. Musculoskeletal: There is some bruising on the left knee. There are venous stasis changes in the skin in both legs. The right knee is slightly swollen. The right ankle seems to be slightly swollen  as well. All the knees and ankles and hips seem to move fairly well. There does seem to be pain in the right foot particularly toward the toes. Both feet are cool but have intact capillary refill  ____________________________________________   LABS (all labs ordered are listed, but only abnormal results are displayed)  Labs Reviewed - No data to display ____________________________________________  EKG   ____________________________________________  RADIOLOGY Study Result   CLINICAL DATA:  Pain.  Questionable fall  EXAM: LEFT ANKLE COMPLETE - 3+ VIEW  COMPARISON:  None.  FINDINGS: Frontal, oblique, and lateral views were obtained. No fracture or joint effusion. Ankle mortise appears intact. No joint space narrowing or erosion. There is pes cavus.  IMPRESSION: No fracture or apparent arthropathy. Ankle mortise appears intact. There is pes cavus.   Electronically Signed   By: Bretta Bang III M.D.   On: 10/01/2016 15:33  Study Result   CLINICAL  DATA:  Nonambulatory patient with dementia.  EXAM: RIGHT ANKLE - COMPLETE 3+ VIEW  COMPARISON:  None.  FINDINGS: Normal anatomic alignment. No evidence for acute fracture or dislocation. Regional soft tissues are unremarkable. Midfoot degenerative changes.  IMPRESSION: No acute osseous abnormality.   Electronically Signed   By: Annia Belt M.D.   On: 10/01/2016 15:35   Study Result   CLINICAL DATA:  Pt not able to walk.  Unknown trauma.  EXAM: RIGHT FOOT COMPLETE - 3+ VIEW  COMPARISON:  None.  FINDINGS: Limited exam due to patient positioning. Normal anatomic alignment. Horizontally oriented lucency within the plantar proximal aspect of the proximal first phalanx of the great toe. Regional soft tissues are unremarkable.  IMPRESSION: Limited exam.  Horizontally oriented lucency within the plantar proximal aspect of the proximal first phalanx of the great toe. Nonspecific finding may represent sequelae of prior injury, sequelae of repetitive stress or potentially acute injury.   Electronically Signed   By: Annia Belt M.D.   On: 10/01/2016 15:41    Study Result   CLINICAL DATA:  Pt presents to ER accompanied by caregiver of group home. Caregiver reports pt does not want to walk on his right leg, swelling noted to right knee and pt points to right ankle. Caregiver reports pt has dementia and down syndrome. Caregiver states the do not know whether or not pt fell. pt poor historian. Best possible images obtained.  EXAM: LEFT KNEE - 1-2 VIEW  COMPARISON:  None.  FINDINGS: Two views of the left knee submitted. No acute fracture or subluxation. There is narrowing of medial joint compartment. Mild chondrocalcinosis. No joint effusion.  IMPRESSION: No acute fracture or subluxation. Mild narrowing of medial joint compartment. Mild chondrocalcinosis.   Electronically Signed   By: Natasha Mead M.D.   On: 10/01/2016 15:46    Study  Result   CLINICAL DATA:  Questionable fall with pain  EXAM: RIGHT KNEE - 1-2 VIEW  COMPARISON:  None.  FINDINGS: Frontal and lateral views were obtained. There is no acute appearing fracture or dislocation. There is a small joint effusion. There is slight narrowing of the patellofemoral joint. Other joint spaces appear normal. There is extensive chondrocalcinosis.  IMPRESSION: Small joint effusion. No fracture or dislocation. Mild narrowing patellofemoral joint. There is chondrocalcinosis, a finding that may be seen with osteoarthritis or with calcium pyrophosphate deposition disease.   Electronically Signed   By: Bretta Bang III M.D.   On: 10/01/2016 15:36     Study Result   CLINICAL DATA:  Pain with questionable fall  EXAM: DG HIP (WITH OR WITHOUT PELVIS) 2-3V RIGHT  COMPARISON:  None.  FINDINGS: Frontal pelvis as well as frontal and lateral right hip images were obtained. There is no appreciable fracture or dislocation. There is slight symmetric narrowing of both hip joints. No erosive change. Contrast is seen in the colon.  IMPRESSION: Slight symmetric narrowing of both hip joints. No fracture or dislocation. No erosive change.   Electronically Signed   By: Bretta Bang III M.D.   On: 10/01/2016 15:31    ____________________________________________   PROCEDURES  Procedure(s) performed:   Procedures  Critical Care performed:   ____________________________________________   INITIAL IMPRESSION / ASSESSMENT AND PLAN / ED COURSE  Pertinent labs & imaging results that were available during my care of the patient were reviewed by me and considered in my medical decision making (see chart for details).        ____________________________________________   FINAL CLINICAL IMPRESSION(S) / ED DIAGNOSES  Final diagnoses:  Foot pain, right  Actual diagnosis is apparent fracture of the proximal phalanx of the right great  toe  NEW MEDICATIONS STARTED DURING THIS VISIT:  New Prescriptions   No medications on file     Note:  This document was prepared using Dragon voice recognition software and may include unintentional dictation errors.    Arnaldo Natal, MD 10/01/16 [phone]

## 2016-10-01 NOTE — ED Triage Notes (Signed)
Pt presents to ER accompanied by caregiver of group home. Caregiver reports pt does not want to walk on his right leg, swelling noted to right knee and pt points to right ankle. Caregiver reports pt has dementia

## 2016-10-01 NOTE — ED Notes (Signed)
Gave family 2 blankets.

## 2016-10-10 ENCOUNTER — Telehealth: Payer: Self-pay

## 2016-10-17 ENCOUNTER — Ambulatory Visit: Payer: Self-pay | Admitting: Oncology

## 2016-10-26 NOTE — Telephone Encounter (Signed)
error 

## 2016-11-02 DIAGNOSIS — D7589 Other specified diseases of blood and blood-forming organs: Secondary | ICD-10-CM | POA: Insufficient documentation

## 2016-11-02 NOTE — Progress Notes (Signed)
[facility]  [phone] [phone]  ID: Grover Canavan OB: [birth year]  MR#: [id number]  GPQ#:[id number]  Patient Care Team: Nino Glow McLean-Scocuzza, MD as PCP - General (Internal Medicine)  CHIEF COMPLAINT: Macrocytosis  INTERVAL HISTORY: Patient is a 61 year old male with medical history significant for Down syndrome and dementia who was noted to have macrocytosis without anemia on routine blood work. The entire history is given by his sister and caretaker since patient is unable to give a review of systems. There are no new neurologic complaints. No report of recent fevers or illnesses. His weight has been stable. He has a good appetite. He does not have any chest pain or shortness of breath. He has no nausea, vomiting, constipation, or diarrhea. Patient is by report at his baseline.  REVIEW OF SYSTEMS:   Review of Systems  Unable to perform ROS: Mental acuity    As per HPI. Otherwise, a complete review of systems is negative.  PAST MEDICAL HISTORY: Past Medical History:  Diagnosis Date  . Anemia   . Dementia   . Down syndrome   . GERD (gastroesophageal reflux disease)   . Osteoporosis     PAST SURGICAL HISTORY: Past Surgical History:  Procedure Laterality Date  . ESOPHAGOGASTRODUODENOSCOPY (EGD) WITH PROPOFOL N/A 06/21/2016   Procedure: ESOPHAGOGASTRODUODENOSCOPY (EGD) WITH PROPOFOL;  Surgeon: Lucilla Lame, MD;  Location: [facility] ENDOSCOPY;  Service: Endoscopy;  Laterality: N/A;  . HERNIA REPAIR      FAMILY HISTORY: Family History  Problem Relation Age of Onset  . Multiple myeloma Father   . Brain cancer Father   . Bone cancer Father   . Leukemia Sister   . Breast cancer Paternal Aunt   . Bladder Cancer Maternal Grandmother   . Leukemia Paternal Grandfather     ADVANCED DIRECTIVES (Y/N):  N  HEALTH MAINTENANCE: Social History  Substance Use Topics  . Smoking status: Never Smoker  . Smokeless tobacco: Never Used  . Alcohol  use No     Colonoscopy:  PAP:  Bone density:  Lipid panel:  No Known Allergies  Current Outpatient Prescriptions  Medication Sig Dispense Refill  . acetaminophen (TYLENOL) 500 MG tablet Take 500 mg by mouth every 6 (six) hours as needed.    [name] artificial tears (LACRILUBE) OINT ophthalmic ointment Place into both eyes at bedtime. 0.25 inch strip to both eyes at bedtime 1 Tube 6  . calcium carbonate (OS-CAL) 600 MG tablet Calcium 600MG Oral Tablet QTY: 180 tablet Days: 90 Refills: 3  Written: 05/13/16 Patient Instructions: twice a day    . cholecalciferol (VITAMIN D) 1000 units tablet Take 1,000 Units by mouth daily.    [name] donepezil (ARICEPT) 5 MG tablet Take 5 mg by mouth at bedtime.    . hydroxypropyl methylcellulose / hypromellose (ISOPTO TEARS / GONIOVISC) 2.5 % ophthalmic solution Place 1 drop into both eyes every 2 (two) hours while awake. 15 mL 12  . loratadine (CLARITIN) 10 MG tablet Take 10 mg by mouth daily.    . Melatonin 3 MG TABS Take 1 tablet by mouth daily.    . memantine (NAMENDA) 10 MG tablet Take 10 mg by mouth daily.    . Multiple Vitamins-Minerals (MULTIVITAMIN ADULT) TABS Multivitamin Adult Oral Tablet QTY: 0 tablet Days: 0 Refills: 0  Written: 05/13/16 Patient Instructions:     . mupirocin ointment (BACTROBAN) 2 % Place 1 application into the nose 2 (two) times daily. Prn signs of cellulitis    . Naftifine HCl (NAFTIN) 1 %  GEL Apply topically. Between toes    . nystatin-triamcinolone (MYCOLOG II) cream Apply 1 application topically 2 (two) times daily. Affected area of elbox flexor and groin    . pantoprazole (PROTONIX) 40 MG tablet Take 1 tablet (40 mg total) by mouth daily. 30 tablet 5  . Polyethylene Glycol 3350 (MIRALAX PO) MiraLax Oral Powder QTY: 0  Days: 0 Refills: 0  Written: 05/13/16 Patient Instructions: 17gm prn    . Probiotic Product (ALIGN PO) Take by mouth.    . sodium chloride (OCEAN) 0.65 % SOLN nasal spray Place 1 spray into both nostrils as needed for  congestion.    . sucralfate (CARAFATE) 1 GM/10ML suspension Take 10 mLs (1 g total) by mouth 4 (four) times daily -  with meals and at bedtime. 420 mL 1  . vitamin B-12 (CYANOCOBALAMIN) 1000 MCG tablet Take 1,000 mcg by mouth daily.    [name] amoxicillin-clavulanate (AUGMENTIN ES-600) 600-42.9 MG/5ML suspension Take 5 mLs (600 mg total) by mouth 3 (three) times daily. (Patient not taking: Reported on 07/19/2016) 200 mL 0  . dextromethorphan (DELSYM) 30 MG/5ML liquid Take 5 mLs (30 mg total) by mouth 2 (two) times daily. (Patient not taking: Reported on 07/19/2016) 89 mL 0  . guaiFENesin (MUCINEX) 600 MG 12 hr tablet Take 1 tablet (600 mg total) by mouth 2 (two) times daily. (Patient not taking: Reported on 07/19/2016) 20 tablet 0  . LORazepam (ATIVAN) 0.5 MG tablet Take 1 tablet (0.5 mg total) by mouth daily as needed for anxiety. 30 tablet 0   No current facility-administered medications for this visit.     OBJECTIVE: Vitals:   11/04/16 1426  BP: 106/70  Pulse: 82  Resp: 18  Temp: 98.2 F (36.8 C)     Body mass index is 19.02 kg/m.    ECOG FS:1 - Symptomatic but completely ambulatory  General: Well-developed, well-nourished, no acute distress. Eyes: Pink conjunctiva, anicteric sclera. HEENT: Normocephalic, moist mucous membranes, clear oropharnyx. Lungs: Clear to auscultation bilaterally. Heart: Regular rate and rhythm. No rubs, murmurs, or gallops. Abdomen: Soft, nontender, nondistended. No organomegaly noted, normoactive bowel sounds. Musculoskeletal: No edema, cyanosis, or clubbing. Neuro: Alert. Skin: No rashes or petechiae noted.   LAB RESULTS:  Lab Results  Component Value Date   NA 141 06/21/2016   K 3.9 06/21/2016   CL 108 06/21/2016   CO2 25 06/21/2016   GLUCOSE 132 (H) 06/21/2016   BUN 18 06/21/2016   CREATININE 1.16 06/21/2016   CALCIUM 8.1 (L) 06/21/2016   PROT 7.2 11/13/2012   ALBUMIN 2.7 (L) 11/13/2012   AST 30 11/13/2012   ALT 19 11/13/2012   ALKPHOS 99  11/13/2012   BILITOT 0.4 11/13/2012   GFRNONAA >60 06/21/2016   GFRAA >60 06/21/2016    Lab Results  Component Value Date   WBC 3.7 (L) 11/04/2016   NEUTROABS 10.1 (H) 06/20/2016   HGB 15.4 11/04/2016   HCT 45.4 11/04/2016   MCV 100.7 (H) 11/04/2016   PLT 252 11/04/2016   Lab Results  Component Value Date   IRON 86 11/04/2016   TIBC 289 11/04/2016   IRONPCTSAT 30 11/04/2016   Lab Results  Component Value Date   FERRITIN 222 11/04/2016     STUDIES: No results found.  ASSESSMENT: Macrocytosis  PLAN:  1. Macrocytosis:  Patient's MCV is only mildly elevated with a normal hemoglobin. The remainder of his laboratory work including iron stores and folate are either negative or within normal limits. His B-12 levels are significantly elevated.  Macrocytosis with elevated B-12 levels can occasionally be seen in patients with underlying MDS. With no cytopenias, this would be unusual. In addition, MDS takes a bone marrow biopsy to diagnose which is not necessary at this time. Finally, it is possible his macrocytosis is medication induced. No interventions are needed at this time. Return to clinic in 3 months with repeat laboratory work and further evaluation. If everything remains stable, patient likely can be discharged from clinic.  Approximately 40 minutes was spent in discussion of which greater than 50% was consultation.  Patient expressed understanding and was in agreement with this plan. He also understands that He can call clinic at any time with any questions, concerns, or complaints.    Lloyd Huger, MD   11/06/2016 10:40 PM

## 2016-11-04 ENCOUNTER — Ambulatory Visit: Payer: Self-pay | Admitting: Internal Medicine

## 2016-11-04 ENCOUNTER — Inpatient Hospital Stay: Payer: Medicare Other

## 2016-11-04 ENCOUNTER — Ambulatory Visit: Payer: Self-pay | Admitting: Oncology

## 2016-11-04 ENCOUNTER — Encounter: Payer: Self-pay | Admitting: Oncology

## 2016-11-04 ENCOUNTER — Inpatient Hospital Stay: Payer: Medicare Other | Attending: Oncology | Admitting: Oncology

## 2016-11-04 DIAGNOSIS — D649 Anemia, unspecified: Secondary | ICD-10-CM | POA: Diagnosis not present

## 2016-11-04 DIAGNOSIS — M818 Other osteoporosis without current pathological fracture: Secondary | ICD-10-CM

## 2016-11-04 DIAGNOSIS — D7589 Other specified diseases of blood and blood-forming organs: Secondary | ICD-10-CM | POA: Diagnosis not present

## 2016-11-04 DIAGNOSIS — Z806 Family history of leukemia: Secondary | ICD-10-CM | POA: Diagnosis not present

## 2016-11-04 DIAGNOSIS — F0390 Unspecified dementia without behavioral disturbance: Secondary | ICD-10-CM

## 2016-11-04 DIAGNOSIS — Z808 Family history of malignant neoplasm of other organs or systems: Secondary | ICD-10-CM | POA: Diagnosis not present

## 2016-11-04 DIAGNOSIS — K219 Gastro-esophageal reflux disease without esophagitis: Secondary | ICD-10-CM

## 2016-11-04 DIAGNOSIS — Z803 Family history of malignant neoplasm of breast: Secondary | ICD-10-CM

## 2016-11-04 DIAGNOSIS — Q909 Down syndrome, unspecified: Secondary | ICD-10-CM | POA: Diagnosis not present

## 2016-11-04 LAB — CBC
HCT: 45.4 % (ref 40.0–52.0)
HEMOGLOBIN: 15.4 g/dL (ref 13.0–18.0)
MCH: 34.2 pg — AB (ref 26.0–34.0)
MCHC: 33.9 g/dL (ref 32.0–36.0)
MCV: 100.7 fL — ABNORMAL HIGH (ref 80.0–100.0)
Platelets: 252 10*3/uL (ref 150–440)
RBC: 4.51 MIL/uL (ref 4.40–5.90)
RDW: 17 % — ABNORMAL HIGH (ref 11.5–14.5)
WBC: 3.7 10*3/uL — AB (ref 3.8–10.6)

## 2016-11-04 LAB — SEDIMENTATION RATE: SED RATE: 15 mm/h (ref 0–20)

## 2016-11-04 LAB — LACTATE DEHYDROGENASE: LDH: 116 U/L (ref 98–192)

## 2016-11-05 LAB — IRON AND TIBC
IRON: 86 ug/dL (ref 45–182)
SATURATION RATIOS: 30 % (ref 17.9–39.5)
TIBC: 289 ug/dL (ref 250–450)
UIBC: 203 ug/dL

## 2016-11-05 LAB — FOLATE: FOLATE: 42 ng/mL (ref >5.9)

## 2016-11-05 LAB — FERRITIN: Ferritin: 222 ng/mL (ref 24–336)

## 2016-11-06 LAB — VITAMIN B12: Vitamin B-12: 1257 pg/mL — ABNORMAL HIGH (ref 180–914)

## 2016-11-11 LAB — COMP PANEL: LEUKEMIA/LYMPHOMA

## 2016-12-27 ENCOUNTER — Emergency Department
Admission: EM | Admit: 2016-12-27 | Discharge: 2016-12-27 | Disposition: A | Discharge status: Home / Self Care | Payer: Medicare Other | Attending: Emergency Medicine | Admitting: Emergency Medicine

## 2016-12-27 ENCOUNTER — Emergency Department: Payer: Medicare Other

## 2016-12-27 ENCOUNTER — Encounter: Payer: Self-pay | Admitting: *Deleted

## 2016-12-27 DIAGNOSIS — S0993XA Unspecified injury of face, initial encounter: Secondary | ICD-10-CM | POA: Diagnosis not present

## 2016-12-27 DIAGNOSIS — M79672 Pain in left foot: Secondary | ICD-10-CM | POA: Diagnosis not present

## 2016-12-27 DIAGNOSIS — F0390 Unspecified dementia without behavioral disturbance: Secondary | ICD-10-CM | POA: Insufficient documentation

## 2016-12-27 DIAGNOSIS — Y9301 Activity, walking, marching and hiking: Secondary | ICD-10-CM | POA: Diagnosis not present

## 2016-12-27 DIAGNOSIS — Y92210 Daycare center as the place of occurrence of the external cause: Secondary | ICD-10-CM | POA: Insufficient documentation

## 2016-12-27 DIAGNOSIS — Q909 Down syndrome, unspecified: Secondary | ICD-10-CM | POA: Insufficient documentation

## 2016-12-27 DIAGNOSIS — Y999 Unspecified external cause status: Secondary | ICD-10-CM | POA: Insufficient documentation

## 2016-12-27 DIAGNOSIS — W19XXXA Unspecified fall, initial encounter: Secondary | ICD-10-CM | POA: Insufficient documentation

## 2016-12-27 DIAGNOSIS — S60511A Abrasion of right hand, initial encounter: Secondary | ICD-10-CM | POA: Insufficient documentation

## 2016-12-27 LAB — BASIC METABOLIC PANEL
Anion gap: 8 (ref 5–15)
BUN: 12 mg/dL (ref 6–20)
CHLORIDE: 101 mmol/L (ref 101–111)
CO2: 27 mmol/L (ref 22–32)
Calcium: 8.7 mg/dL — ABNORMAL LOW (ref 8.9–10.3)
Creatinine, Ser: 0.67 mg/dL (ref 0.61–1.24)
GFR calc Af Amer: >60 mL/min (ref >60)
GFR calc non Af Amer: >60 mL/min (ref >60)
GLUCOSE: 105 mg/dL — AB (ref 65–99)
POTASSIUM: 3.7 mmol/L (ref 3.5–5.1)
SODIUM: 136 mmol/L (ref 135–145)

## 2016-12-27 LAB — CBC WITH DIFFERENTIAL/PLATELET
Basophils Absolute: 0.1 10*3/uL (ref 0–0.1)
Basophils Relative: 2 %
EOS PCT: 2 %
Eosinophils Absolute: 0.1 10*3/uL (ref 0–0.7)
HCT: 43.2 % (ref 40.0–52.0)
Hemoglobin: 14.8 g/dL (ref 13.0–18.0)
LYMPHS ABS: 0.8 10*3/uL — AB (ref 1.0–3.6)
LYMPHS PCT: 15 %
MCH: 35.4 pg — AB (ref 26.0–34.0)
MCHC: 34.2 g/dL (ref 32.0–36.0)
MCV: 103.7 fL — AB (ref 80.0–100.0)
Monocytes Absolute: 0.5 10*3/uL (ref 0.2–1.0)
Monocytes Relative: 9 %
Neutro Abs: 3.8 10*3/uL (ref 1.4–6.5)
Neutrophils Relative %: 72 %
PLATELETS: 222 10*3/uL (ref 150–440)
RBC: 4.17 MIL/uL — AB (ref 4.40–5.90)
RDW: 16.5 % — ABNORMAL HIGH (ref 11.5–14.5)
WBC: 5.3 10*3/uL (ref 3.8–10.6)

## 2016-12-27 LAB — GLUCOSE, CAPILLARY: Glucose-Capillary: 122 mg/dL — ABNORMAL HIGH (ref 65–99)

## 2016-12-27 MED ORDER — ACETAMINOPHEN 325 MG PO TABS
650.0000 mg | ORAL_TABLET | Freq: Once | ORAL | Status: AC
Start: 2016-12-27 — End: 2016-12-27
  Administered 2016-12-27: 650 mg via ORAL
  Filled 2016-12-27: qty 2

## 2016-12-27 MED ORDER — BACITRACIN-NEOMYCIN-POLYMYXIN 400-5-5000 EX OINT: TOPICAL_OINTMENT | Freq: Once | CUTANEOUS | Status: DC

## 2016-12-27 NOTE — ED Notes (Signed)
Group home member at bedside.

## 2016-12-27 NOTE — ED Notes (Signed)
Pt in xray and CT, caregiver went with pt

## 2016-12-27 NOTE — Discharge Instructions (Signed)
For Huxley's abrasion, you may apply Neosporin and a thick coat 3 times daily until it has completely healed. Continue to monitor the abrasion for any signs or symptoms of infection, including redness, swelling, pain, or pus drainage.  Please take all precautions to prevent falls.  Return to the emergency department if Gaynell FaceMarshall develops altered mental status, fever, becomes too sleepy, has vomiting, or any other symptoms concerning to you.

## 2016-12-27 NOTE — ED Triage Notes (Signed)
Pt arrives via EMS from adult day care when he tripped and fell, per EMS pt fell on his face, some dried blood noticed on mouth upon arrival, when asked what hurts pt points to left ankle, EDP at bedside, pt has hx of dementia and down syndrome, pt appears in no acute distress

## 2016-12-27 NOTE — ED Provider Notes (Signed)
[facility] Emergency Department Provider Note  ____________________________________________  Time seen: Approximately 9:35 AM  I have reviewed the triage vital signs and the nursing notes.   HISTORY  Chief Complaint Fall  The patient's history is limited because he is nonverbal.  HPI Jesus Morton is a 61 y.o. male with a history of Down syndrome, dementia, and osteoporosis brought from adult daycare for fall.Per EMS report, the patient had a mechanical fall and the patient and the staff told EMS that he tripped. He landed on his face but did not lose consciousness. He had a blood pressure of 98 en route, and EMS was unable to get a reliable pulse oximeter reading but the patient was not having any shortness of breath or cyanosis. When asked about pain, the patient points to the dorsum of the left foot.   Past Medical History:  Diagnosis Date  . Anemia   . Dementia   . Down syndrome   . GERD (gastroesophageal reflux disease)   . Osteoporosis     Patient Active Problem List   Diagnosis Date Noted  . Macrocytosis 11/02/2016  . Dementia 07/19/2016  . Down syndrome 07/19/2016  . Foreign body alimentary tract 06/22/2016  . Abnormal findings on esophagogastroduodenoscopy (EGD) 06/22/2016  . Hemorrhagic esophagitis 06/22/2016  . Hypotension 06/22/2016  . Aspiration pneumonia ([place]) 06/22/2016  . Foreign body aspiration   . Cough   . Pneumonitis due to food and vomit ([place])   . Problems with swallowing and mastication   . Aspiration of foreign body   . Aspiration pneumonia of right lower lobe ([place]) 06/20/2016  . Cellulitis of right hand 12/24/2015  . Reflux esophagitis 03/10/2014  . Hematemesis 02/16/2014  . Inguinal hernia 12/18/2013  . Cellulitis of right arm 11/28/2013  . Multiple facial fractures ([place]) 03/05/2013  . GERD (gastroesophageal reflux disease) 01/28/2013  . Dysphagia 05/31/2012  . Cellulitis and abscess of leg 06/21/2011  . Corns and  callosity 04/26/2011  . Onychomycosis due to dermatophyte 04/26/2011  . Osteoporosis 04/29/2010    Past Surgical History:  Procedure Laterality Date  . ESOPHAGOGASTRODUODENOSCOPY (EGD) WITH PROPOFOL N/A 06/21/2016   Procedure: ESOPHAGOGASTRODUODENOSCOPY (EGD) WITH PROPOFOL;  Surgeon: Lucilla Lame, MD;  Location: [facility] ENDOSCOPY;  Service: Endoscopy;  Laterality: N/A;  . HERNIA REPAIR      Current Outpatient Rx  . Order #: [id number] Class: Historical Med  . Order #: [id number] Class: Normal  . Order #: [id number] Class: Historical Med  . Order #: [id number] Class: Historical Med  . Order #: [id number] Class: Historical Med  . Order #: [id number] Class: Normal  . Order #: [id number] Class: Historical Med  . Order #: [id number] Class: Print  . Order #: [id number] Class: Historical Med  . Order #: [id number] Class: Historical Med  . Order #: [id number] Class: Historical Med  . Order #: [id number] Class: Historical Med  . Order #: [id number] Class: Historical Med  . Order #: [id number] Class: Print  . Order #: [id number] Class: Historical Med  . Order #: [id number] Class: Historical Med  . Order #: [id number] Class: Historical Med  . Order #: [id number] Class: Normal  . Order #: [id number] Class: Historical Med  . Order #: [id number] Class: Print  . Order #: [id number] Class: Normal  . Order #: [id number] Class: Normal    Allergies Patient has no known allergies.  Family History  Problem Relation Age of Onset  . Multiple myeloma Father   . Brain cancer Father   . Bone cancer Father   . Leukemia Sister   . Breast cancer Paternal Aunt   . Bladder Cancer Maternal Grandmother   .  Leukemia Paternal Grandfather     Social History Social History  Substance Use Topics  . Smoking status: Never Smoker  . Smokeless tobacco: Never Used  . Alcohol use No    Review of Systems Unable to obtain due to patient being nonverbal.  ____________________________________________   PHYSICAL EXAM:  VITAL SIGNS: ED Triage  Vitals [12/27/16 0934]  Enc Vitals Group     BP 110/61     Pulse Rate 67     Resp 18     Temp 98.4 F (36.9 C)     Temp Source Oral     SpO2 100 %     Weight 116 lb 13.5 oz (53 kg)     Height 5' (1.524 m)     Head Circumference      Peak Flow      Pain Score      Pain Loc      Pain Edu?      Excl. in [place]?     Constitutional: The patient is alert, making good eye contact, and occasionally says a word or 2 but is generally nonverbal. He is comfortable appearing, and slightly scared. GCS 15. Eyes: EOMI and PERRLA. The patient has some yellow discharge in the left eye. No raccoon eyes. EARS: TMs are obscured by cerumen bilaterally. The canals show no evidence of infection. Head: No Battle sign. Nose: No congestion/rhinnorhea. No swelling over the nose. No septal hematoma. Mouth/Throat: The patient has a brown discoloration around the mouth with generally poor dentition and most of the left lower teeth missing. It is unclear whether this brown discoloration as blood or food, but I do not see any evidence of laceration or abrasion on the tongue, palate, around the teeth, on the buccal mucosa or the lips. The patient has no stridor, drooling, no malocclusion or dental injury. Mucous membranes are moist.  Neck: No stridor.  Supple.   No midline C-spine tenderness to palpation, step-offs or deformities. Cardiovascular: Normal rate, regular rhythm. No murmurs, rubs or gallops.  Respiratory: Normal respiratory effort.  No accessory muscle use or retractions. Lungs CTAB.  No wheezes, rales or ronchi. Gastrointestinal: Soft, nontender and nondistended.  No guarding or rebound.  No peritoneal signs. Musculoskeletal: Pelvis is stable. Full range of motion of the hips and knees, ankles, without pain. 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 Normal DP and PT pulses bilaterally. Cap refill  is less than 2 seconds in the lower extremities bilaterally. The patient has a 0.5 cm abrasion over the knuckle of the index finger that is superficial on the right hand with full range of motion of the 5 digits of the right hand. He has normal radial pulses bilaterally. He has full range of motion of the shoulders, elbows bilaterally. The patient has scoliosis of the back, but no obvious step-offs or deformities, nor any evidence of pain, with palpation over the thoracic and lumbar spine. Neurologic:  Alert.  Speech is clear.  Face and smile are symmetric.  EOMI.  Moves all extremities well. Skin:  Skin is warm, dry.   ____________________________________________   LABS (all labs ordered are listed, but only abnormal results are displayed)  Labs Reviewed  GLUCOSE, CAPILLARY - Abnormal; Notable for the following:       Result Value   Glucose-Capillary 122 (*)    All other components within normal limits  BASIC  METABOLIC PANEL - Abnormal; Notable for the following:    Glucose, Bld 105 (*)    Calcium 8.7 (*)    All other components within normal limits  CBC WITH DIFFERENTIAL/PLATELET - Abnormal; Notable for the following:    RBC 4.17 (*)    MCV 103.7 (*)    MCH 35.4 (*)    RDW 16.5 (*)    Lymphs Abs 0.8 (*)    All other components within normal limits   ____________________________________________  EKG  ED ECG REPORT I, Eula Listen, the attending physician, personally viewed and interpreted this ECG.   Date: 12/27/2016  EKG Time: 939  Rate: 74  Rhythm: normal sinus rhythm; RBBB  Axis: normal  Intervals:none  ST&T Change: No STEMI. No evidence of ischemic changes. The EKG reads atrial fibrillation but this is likely artifact as the patient does have P waves indicative of normal sinus rhythm.  This EKG is compared to EKG from 11/14/12, which shows a right bundle branch block and is otherwise similar in  morphology.  ____________________________________________  RADIOLOGY  Ct Head Wo Contrast  Result Date: 12/27/2016 CLINICAL DATA:  Dementia. Down syndrome. Fall today with abrasions to the face. Initial encounter. EXAM: CT HEAD WITHOUT CONTRAST TECHNIQUE: Contiguous axial images were obtained from the base of the skull through the vertex without intravenous contrast. COMPARISON:  CT head without contrast 09/09/2016. FINDINGS: Brain: Mild generalized atrophy and white matter disease is similar the prior exam. No acute infarct, hemorrhage, or mass lesion is present. The ventricles are proportionate to the degree of atrophy. No significant extra-axial fluid collection is present. Vascular: No hyperdense vessel or unexpected calcification. Skull: The calvarium is intact. No focal lytic or blastic lesions are present. No significant extracranial soft tissue injury is evident. No acute fractures are present. Sinuses/Orbits: The visualized paranasal sinuses and mastoid air cells are clear. The globes and orbits are within normal limits. IMPRESSION: 1. Stable atrophy and white matter disease. 2. No acute soft tissue injury or fracture. 3. No acute intracranial abnormality. Electronically Signed   By: San Morelle M.D.   On: 12/27/2016 10:54   Dg Hand Complete Right  Result Date: 12/27/2016 CLINICAL DATA:  Pain following fall EXAM: RIGHT HAND - COMPLETE 3+ VIEW COMPARISON:  None. FINDINGS: Frontal, oblique, and lateral views were obtained. There is no fracture or dislocation. There is mild narrowing of the first IP joint. There is calcification in the second PIP joint dorsally. There is osteoarthritic change in the first carpal -metacarpal joint and to a lesser extent in the scaphotrapezial joint. No erosive change or periostitis. IMPRESSION: Areas of osteoarthritic change, most notably in the first carpal -metacarpal joint. No acute fracture or dislocation. Electronically Signed   By: Lowella Grip  III M.D.   On: 12/27/2016 10:38   Dg Foot Complete Left  Result Date: 12/27/2016 CLINICAL DATA:  Pain following fall EXAM: LEFT FOOT - COMPLETE 3+ VIEW COMPARISON:  None. FINDINGS: Frontal, oblique, and lateral views were obtained. There is marked pes cavus. There is marked hallux valgus deformity at the first MTP joint. There is flexion of all MTP and PIP joints. There is no evident acute fracture or dislocation. There is osteoarthritic change in the first MTP joint. Other joint spaces appear unremarkable. No erosive change. IMPRESSION: No acute fracture or dislocation. Osteoarthritic change first MTP joint. Marked hallux valgus deformity first MTP joint. Marked pes cavus. Flexion of multiple distal joints. Electronically Signed   By: Lowella Grip III M.D.  On: 12/27/2016 10:40    ____________________________________________   PROCEDURES  Procedure(s) performed: None  Procedures  Critical Care performed: No ____________________________________________   INITIAL IMPRESSION / ASSESSMENT AND PLAN / ED COURSE  Pertinent labs & imaging results that were available during my care of the patient were reviewed by me and considered in my medical decision making (see chart for details).  61 y.o. male with a history of Down syndrome and dementia presenting with a fall with possible blood amount of the mouth but no obvious injury, abrasion to the right hand, and pain over the dorsum of the left foot. Overall, the patient has reassuring vital signs, and we will monitor this given that his initial blood pressure with EMS was 98. Here, he is mostly comfortable appearing and I will treat his pain with Tylenol. However, given that the patient is unable to give me significant history, we will get a CT scan of the head to rule out any intracranial process, and get basic labs. EKG is not indicative of arrhythmia or ischemia. Plan reevaluation for final  disposition.  ----------------------------------------- 11:23 AM on 12/27/2016 -----------------------------------------  The patient's imaging is reassuring in the emergency department. His CT scan does not show any acute process, and he has no acute injuries of the right hand of the left foot. He is resting comfortably at this time. I'm awaiting the results of his laboratory studies, and if they are reassuring, we'll plan discharge home with close PMD follow-up. Return precautions and follow-up instructions will be clarified in his discharge paperwork I will trial with him to the group home, and I will review this with his caretaker, who is at the bedside.  ----------------------------------------- 12:19 PM on 12/27/2016 -----------------------------------------  Patient's laboratory studies are reassuring, and the patient has continued to remain at baseline and be hemodynamically stable. This time, the patient is safe for discharge. I discussed return precautions and follow-up instructions with both the patient and his caretaker.  ____________________________________________  FINAL CLINICAL IMPRESSION(S) / ED DIAGNOSES  Final diagnoses:  Fall, initial encounter  Hand abrasion, right, initial encounter  Left foot pain         NEW MEDICATIONS STARTED DURING THIS VISIT:  New Prescriptions   No medications on file      Eula Listen, MD 12/27/16 1219

## 2016-12-27 NOTE — ED Notes (Signed)
Pt returned from ct and xray.

## 2017-02-02 ENCOUNTER — Other Ambulatory Visit: Payer: Self-pay | Admitting: *Deleted

## 2017-02-02 DIAGNOSIS — D7589 Other specified diseases of blood and blood-forming organs: Secondary | ICD-10-CM

## 2017-02-02 NOTE — Progress Notes (Signed)
[place]  Telephone:([phone]) [phone] [phone]) [phone]  ID: Jesus Morton OB: May 04, 1956  MR#: [id number]  GEX#:[id number]  Patient Care Team: McLean-Scocuzza, Nino Glow, MD as PCP - General (Internal Medicine)  CHIEF COMPLAINT: Macrocytosis  INTERVAL HISTORY: Patient returns to clinic today for repeat laboratory work and further evaluation. The entire history is given by his caretaker since patient is unable to give a review of systems. There are no new neurologic complaints although she feels his dementia is getting worse. No report of recent fevers or illnesses. He has had mild weight loss. He has a fair appetite. He does not have any chest pain or shortness of breath. He has no nausea, vomiting, constipation, or diarrhea. Patient is by report at his baseline.  REVIEW OF SYSTEMS:   Review of Systems  Unable to perform ROS: Mental acuity    As per HPI. Otherwise, a complete review of systems is negative.  PAST MEDICAL HISTORY: Past Medical History:  Diagnosis Date  . Anemia   . Dementia   . Down syndrome   . GERD (gastroesophageal reflux disease)   . Osteoporosis     PAST SURGICAL HISTORY: Past Surgical History:  Procedure Laterality Date  . ESOPHAGOGASTRODUODENOSCOPY (EGD) WITH PROPOFOL N/A 06/21/2016   Procedure: ESOPHAGOGASTRODUODENOSCOPY (EGD) WITH PROPOFOL;  Surgeon: Lucilla Lame, MD;  Location: [facility] ENDOSCOPY;  Service: Endoscopy;  Laterality: N/A;  . HERNIA REPAIR      FAMILY HISTORY: Family History  Problem Relation Age of Onset  . Multiple myeloma Father   . Brain cancer Father   . Bone cancer Father   . Leukemia Sister   . Breast cancer Paternal Aunt   . Bladder Cancer Maternal Grandmother   . Leukemia Paternal Grandfather     ADVANCED DIRECTIVES (Y/N):  N  HEALTH MAINTENANCE: Social History  Substance Use Topics  . Smoking status: Never Smoker  . Smokeless tobacco: Never Used  . Alcohol use No     Colonoscopy:  PAP:  Bone  density:  Lipid panel:  No Known Allergies  Current Outpatient Prescriptions  Medication Sig Dispense Refill  . acetaminophen (TYLENOL) 500 MG tablet Take 500 mg by mouth every 6 (six) hours as needed.    [name] artificial tears (LACRILUBE) OINT ophthalmic ointment Place into both eyes at bedtime. 0.25 inch strip to both eyes at bedtime 1 Tube 6  . calcium carbonate (OS-CAL) 600 MG tablet Take 600 mg by mouth twice daily.    . cholecalciferol (VITAMIN D) 1000 units tablet Take 1,000 Units by mouth daily.    . hydroxypropyl methylcellulose / hypromellose (ISOPTO TEARS / GONIOVISC) 2.5 % ophthalmic solution Place 1 drop into both eyes every 2 (two) hours while awake. 15 mL 12  . loratadine (CLARITIN) 10 MG tablet Take 10 mg by mouth daily.    [name] LORazepam (ATIVAN) 0.5 MG tablet Take 1 tablet (0.5 mg total) by mouth daily as needed for anxiety. 30 tablet 0  . Melatonin 3 MG TABS Take 1 tablet by mouth daily.    . memantine (NAMENDA) 10 MG tablet Take 10 mg by mouth 2 (two) times daily.     . Multiple Vitamins-Minerals (MULTIVITAMIN ADULT) TABS Multivitamin Adult Oral Tablet QTY: 0 tablet Days: 0 Refills: 0  Written: 05/13/16 Patient Instructions:     . Naftifine HCl (NAFTIN) 1 % GEL Apply topically. Between toes    . nystatin-triamcinolone (MYCOLOG II) cream Apply 1 application topically 2 (two) times daily. Affected area of elbox flexor and groin    .  pantoprazole (PROTONIX) 40 MG tablet Take 1 tablet (40 mg total) by mouth daily. 30 tablet 5  . Polyethylene Glycol 3350 (MIRALAX PO) MiraLax Oral Powder QTY: 0  Days: 0 Refills: 0  Written: 05/13/16 Patient Instructions: 17gm prn    . Probiotic Product (ALIGN PO) Take by mouth.    . sodium chloride (OCEAN) 0.65 % SOLN nasal spray Place 1 spray into both nostrils as needed for congestion.    . sucralfate (CARAFATE) 1 GM/10ML suspension Take 10 mLs (1 g total) by mouth 4 (four) times daily -  with meals and at bedtime. 420 mL 1  . vitamin B-12  (CYANOCOBALAMIN) 1000 MCG tablet Take 1,000 mcg by mouth daily.    [name] amoxicillin-clavulanate (AUGMENTIN ES-600) 600-42.9 MG/5ML suspension Take 5 mLs (600 mg total) by mouth 3 (three) times daily. (Patient not taking: Reported on 07/19/2016) 200 mL 0  . dextromethorphan (DELSYM) 30 MG/5ML liquid Take 5 mLs (30 mg total) by mouth 2 (two) times daily. (Patient not taking: Reported on 07/19/2016) 89 mL 0  . guaiFENesin (MUCINEX) 600 MG 12 hr tablet Take 1 tablet (600 mg total) by mouth 2 (two) times daily. (Patient not taking: Reported on 12/27/2016) 20 tablet 0   No current facility-administered medications for this visit.     OBJECTIVE: Vitals:   02/03/17 1119  BP: 110/65  Pulse: (!) 55  Resp: 18  Temp: (!) 97.4 F (36.3 C)     Body mass index is 18.9 kg/m.    ECOG FS:1 - Symptomatic but completely ambulatory  General: Well-developed, well-nourished, no acute distress. Eyes: Pink conjunctiva, anicteric sclera. HEENT: Normocephalic, moist mucous membranes, clear oropharnyx. Lungs: Clear to auscultation bilaterally. Heart: Regular rate and rhythm. No rubs, murmurs, or gallops. Abdomen: Soft, nontender, nondistended. No organomegaly noted, normoactive bowel sounds. Musculoskeletal: No edema, cyanosis, or clubbing. Neuro: Alert. Skin: No rashes or petechiae noted.   LAB RESULTS:  Lab Results  Component Value Date   NA 136 12/27/2016   K 3.7 12/27/2016   CL 101 12/27/2016   CO2 27 12/27/2016   GLUCOSE 105 (H) 12/27/2016   BUN 12 12/27/2016   CREATININE 0.67 12/27/2016   CALCIUM 8.7 (L) 12/27/2016   PROT 7.2 11/13/2012   ALBUMIN 2.7 (L) 11/13/2012   AST 30 11/13/2012   ALT 19 11/13/2012   ALKPHOS 99 11/13/2012   BILITOT 0.4 11/13/2012   GFRNONAA >60 12/27/2016   GFRAA >60 12/27/2016    Lab Results  Component Value Date   WBC 7.1 02/03/2017   NEUTROABS 5.2 02/03/2017   HGB 14.6 02/03/2017   HCT 42.1 02/03/2017   MCV 104.2 (H) 02/03/2017   PLT 246 02/03/2017   Lab  Results  Component Value Date   IRON 86 11/04/2016   TIBC 289 11/04/2016   IRONPCTSAT 30 11/04/2016   Lab Results  Component Value Date   FERRITIN 222 11/04/2016     STUDIES: No results found.  ASSESSMENT: Macrocytosis  PLAN:  1. Macrocytosis:  Patient's MCV is only mildly elevated with a normal hemoglobin. The remainder of his laboratory work including iron stores and folate are either negative or within normal limits. His B-12 levels are significantly elevated. Macrocytosis with elevated B-12 levels can occasionally be seen in patients with underlying MDS but without cytopenias, this would be unusual. In addition, MDS takes a bone marrow biopsy to diagnose which is not necessary at this time. Finally, it is possible his macrocytosis is medication induced. No interventions are needed at this time. No  follow-up is necessary  Approximately 20 minutes was spent in discussion of which greater than 50% was consultation.  Patient expressed understanding and was in agreement with this plan. He also understands that He can call clinic at any time with any questions, concerns, or complaints.    Lloyd Huger, MD   02/04/2017 8:17 AM

## 2017-02-03 ENCOUNTER — Inpatient Hospital Stay: Payer: Medicare Other | Attending: Oncology | Admitting: Oncology

## 2017-02-03 ENCOUNTER — Inpatient Hospital Stay: Payer: Medicare Other

## 2017-02-03 VITALS — BP 110/65 | HR 55 | Temp 97.4°F | Resp 18 | Wt 96.8 lb

## 2017-02-03 DIAGNOSIS — Z808 Family history of malignant neoplasm of other organs or systems: Secondary | ICD-10-CM | POA: Diagnosis not present

## 2017-02-03 DIAGNOSIS — Z803 Family history of malignant neoplasm of breast: Secondary | ICD-10-CM

## 2017-02-03 DIAGNOSIS — Q909 Down syndrome, unspecified: Secondary | ICD-10-CM

## 2017-02-03 DIAGNOSIS — K219 Gastro-esophageal reflux disease without esophagitis: Secondary | ICD-10-CM | POA: Diagnosis not present

## 2017-02-03 DIAGNOSIS — Z8052 Family history of malignant neoplasm of bladder: Secondary | ICD-10-CM | POA: Diagnosis not present

## 2017-02-03 DIAGNOSIS — R634 Abnormal weight loss: Secondary | ICD-10-CM | POA: Diagnosis not present

## 2017-02-03 DIAGNOSIS — F0390 Unspecified dementia without behavioral disturbance: Secondary | ICD-10-CM | POA: Insufficient documentation

## 2017-02-03 DIAGNOSIS — D7589 Other specified diseases of blood and blood-forming organs: Secondary | ICD-10-CM | POA: Insufficient documentation

## 2017-02-03 DIAGNOSIS — Z79899 Other long term (current) drug therapy: Secondary | ICD-10-CM | POA: Diagnosis not present

## 2017-02-03 DIAGNOSIS — Z806 Family history of leukemia: Secondary | ICD-10-CM | POA: Diagnosis not present

## 2017-02-03 DIAGNOSIS — M810 Age-related osteoporosis without current pathological fracture: Secondary | ICD-10-CM | POA: Insufficient documentation

## 2017-02-03 LAB — CBC WITH DIFFERENTIAL/PLATELET
BASOS PCT: 1 %
Basophils Absolute: 0.1 10*3/uL (ref 0–0.1)
EOS ABS: 0.1 10*3/uL (ref 0–0.7)
Eosinophils Relative: 1 %
HCT: 42.1 % (ref 40.0–52.0)
HEMOGLOBIN: 14.6 g/dL (ref 13.0–18.0)
LYMPHS ABS: 1.2 10*3/uL (ref 1.0–3.6)
Lymphocytes Relative: 17 %
MCH: 36.1 pg — ABNORMAL HIGH (ref 26.0–34.0)
MCHC: 34.6 g/dL (ref 32.0–36.0)
MCV: 104.2 fL — ABNORMAL HIGH (ref 80.0–100.0)
MONO ABS: 0.6 10*3/uL (ref 0.2–1.0)
MONOS PCT: 8 %
NEUTROS PCT: 73 %
Neutro Abs: 5.2 10*3/uL (ref 1.4–6.5)
Platelets: 246 10*3/uL (ref 150–440)
RBC: 4.04 MIL/uL — ABNORMAL LOW (ref 4.40–5.90)
RDW: 15.4 % — AB (ref 11.5–14.5)
WBC: 7.1 10*3/uL (ref 3.8–10.6)

## 2017-03-08 ENCOUNTER — Ambulatory Visit: Payer: Self-pay | Admitting: Internal Medicine

## 2017-03-09 ENCOUNTER — Encounter: Payer: Medicare Other | Attending: Surgery | Admitting: Surgery

## 2017-03-09 DIAGNOSIS — L89153 Pressure ulcer of sacral region, stage 3: Secondary | ICD-10-CM | POA: Insufficient documentation

## 2017-03-09 DIAGNOSIS — E441 Mild protein-calorie malnutrition: Secondary | ICD-10-CM | POA: Diagnosis not present

## 2017-03-09 DIAGNOSIS — L89313 Pressure ulcer of right buttock, stage 3: Secondary | ICD-10-CM | POA: Diagnosis present

## 2017-03-09 DIAGNOSIS — Q909 Down syndrome, unspecified: Secondary | ICD-10-CM | POA: Insufficient documentation

## 2017-03-09 DIAGNOSIS — M810 Age-related osteoporosis without current pathological fracture: Secondary | ICD-10-CM | POA: Diagnosis not present

## 2017-03-09 DIAGNOSIS — D649 Anemia, unspecified: Secondary | ICD-10-CM | POA: Insufficient documentation

## 2017-03-09 DIAGNOSIS — F0390 Unspecified dementia without behavioral disturbance: Secondary | ICD-10-CM | POA: Diagnosis not present

## 2017-03-09 DIAGNOSIS — K219 Gastro-esophageal reflux disease without esophagitis: Secondary | ICD-10-CM | POA: Diagnosis not present

## 2017-03-09 DIAGNOSIS — Z79899 Other long term (current) drug therapy: Secondary | ICD-10-CM | POA: Insufficient documentation

## 2017-03-12 NOTE — Progress Notes (Signed)
Jesus Morton ([id number]) Visit Report for 03/09/2017 Allergy List Details Patient Name: Jesus, Morton 03/09/2017 10:30 Date of Service: AM Medical Record [id number] Number: Patient Account Number: [id number] Date of Birth/Sex: 1956-04-11 (61 y.o. Male) Treating RN: Ermalinda Barrios, Other Clinician: Primary Care Jesus Morton: Fabio Neighbors Referring Jesus Morton: Bluford Main Karee Forge/Extender: Weeks in Treatment: 0 Allergies Active Allergies NKDA Allergy Notes Electronic Signature(s) Signed: 03/10/2017 4:51:13 PM By: Alejandro Mulling Entered By: Alejandro Mulling on 03/09/2017 10:51:28 Jesus Morton ([id number]) -------------------------------------------------------------------------------- Arrival Information Details Patient Name: Jesus Morton Date of Service: 03/09/2017 10:30 AM Medical Record Patient Account Number: [id number] Number: Treating RN: Phillis Haggis Date of Birth/Sex: 09-04-1955 (61 y.o. Male) Other Clinician: Collier Flowers Primary Care Marifer Hurd: Jesus Morton: Jesus Morton Referring Namrata Dangler: Irven Coe in Treatment: 0 Visit Information Patient Arrived: Wheel Chair Arrival Time: 10:42 Accompanied By: caregivers Transfer Assistance: EasyPivot Patient Lift Patient Identification Verified: Yes Secondary Verification Process Yes Completed: Patient Requires Transmission- No Based Precautions: Patient Has Alerts: No Electronic Signature(s) Signed: 03/10/2017 4:51:13 PM By: Alejandro Mulling Entered By: Alejandro Mulling on 03/09/2017 10:45:40 Jesus Morton ([id number]) -------------------------------------------------------------------------------- Clinic Level of Care Assessment Details Patient Name: Jesus, Morton 03/09/2017 10:30 Date of Service: AM Medical Record [id number] Number: Patient Account Number: [id number] Date of Birth/Sex: January 24, 1956 (61 y.o.  Male) Treating RN: Ermalinda Barrios, Other Clinician: Primary Care Jesus Morton: Fabio Neighbors Referring Arianny Pun: Bluford Main Gerrard Crystal/Extender: Weeks in Treatment: 0 Clinic Level of Care Assessment Items TOOL 2 Quantity Score X - Use when only an EandM is performed on the INITIAL visit 1 0 ASSESSMENTS - Nursing Assessment / Reassessment X - General Physical Exam (combine w/ comprehensive assessment (listed just 1 20 below) when performed on new pt. evals) X - Comprehensive Assessment (HX, ROS, Risk Assessments, Wounds Hx, etc.) 1 25 ASSESSMENTS - Wound and Skin Assessment / Reassessment X - Simple Wound Assessment / Reassessment - one wound 1 5 []  - Complex Wound Assessment / Reassessment - multiple wounds 0 []  - Dermatologic / Skin Assessment (not related to wound area) 0 ASSESSMENTS - Ostomy and/or Continence Assessment and Care []  - Incontinence Assessment and Management 0 []  - Ostomy Care Assessment and Management (repouching, etc.) 0 PROCESS - Coordination of Care []  - Simple Patient / Family Education for ongoing care 0 X - Complex (extensive) Patient / Family Education for ongoing care 1 20 X - Staff obtains Chiropractor, Records, Test Results / Process Orders 1 10 X - Staff telephones HHA, Nursing Homes / Clarify orders / etc 1 10 []  - Routine Transfer to another Facility (non-emergent condition) 0 []  - Routine Hospital Admission (non-emergent condition) 0 []  - New Admissions / Manufacturing engineer / Ordering NPWT, Apligraf, etc. 0 []  - Emergency Hospital Admission (emergent condition) 0 Jesus Morton ([id number]) X - Simple Discharge Coordination 1 10 []  - Complex (extensive) Discharge Coordination 0 PROCESS - Special Needs []  - Pediatric / Minor Patient Management 0 []  - Isolation Patient Management 0 []  - Hearing / Language / Visual special needs 0 []  - Assessment of Community assistance (transportation, D/C planning, etc.) 0 []  -  Additional assistance / Altered mentation 0 []  - Support Surface(s) Assessment (bed, cushion, seat, etc.) 0 INTERVENTIONS - Wound Cleansing / Measurement X - Wound Imaging (photographs - any number of wounds) 1 5 []  - Wound Tracing (instead of photographs) 0 X - Simple Wound Measurement - one wound 1 5 []  - Complex Wound Measurement - multiple wounds 0 X - Simple Wound Cleansing -  one wound 1 5 []  - Complex Wound Cleansing - multiple wounds 0 INTERVENTIONS - Wound Dressings X - Small Wound Dressing one or multiple wounds 1 10 []  - Medium Wound Dressing one or multiple wounds 0 []  - Large Wound Dressing one or multiple wounds 0 []  - Application of Medications - injection 0 INTERVENTIONS - Miscellaneous []  - External ear exam 0 []  - Specimen Collection (cultures, biopsies, blood, body fluids, etc.) 0 []  - Specimen(s) / Culture(s) sent or taken to Lab for analysis 0 []  - Patient Transfer (multiple staff / Michiel Sites Lift / Similar devices) 0 []  - Simple Staple / Suture removal (25 or less) 0 Jesus Morton ([id number]) []  - Complex Staple / Suture removal (26 or more) 0 []  - Hypo / Hyperglycemic Management (close monitor of Blood Glucose) 0 []  - Ankle / Brachial Index (ABI) - do not check if billed separately 0 Has the patient been seen at the hospital within the last three years: Yes Total Score: 125 Level Of Care: New/Established - Level 4 Electronic Signature(s) Signed: 03/10/2017 4:51:13 PM By: Alejandro Mulling Entered By: Alejandro Mulling on 03/09/2017 13:14:51 Jesus Morton ([id number]) -------------------------------------------------------------------------------- Encounter Discharge Information Details Patient Name: Jesus, Morton 03/09/2017 10:30 Date of Service: AM Medical Record [id number] Number: Patient Account Number: [id number] Date of Birth/Sex: [birth year] (61 y.o. Male) Treating RN: Ermalinda Barrios, Other Clinician: Primary Care Jesus Morton: Fabio Neighbors Referring Jesus Morton: Bluford Main Jesus Morton/Extender: Weeks in Treatment: 0 Encounter Discharge Information Items Discharge Pain Level: 0 Discharge Condition: Stable Ambulatory Status: Wheelchair Discharge Destination: Nursing Home Transportation: Private Auto Accompanied By: caregivers Schedule Follow-up Appointment: Yes Medication Reconciliation completed and provided to Patient/Care No Okley Magnussen: Provided on Clinical Summary of Care: 03/09/2017 Form Type Recipient Paper Patient [place] Electronic Signature(s) Signed: 03/10/2017 4:51:13 PM By: Alejandro Mulling Entered By: Alejandro Mulling on 03/09/2017 11:34:39 Jesus Morton ([id number]) -------------------------------------------------------------------------------- Lower Extremity Assessment Details Patient Name: Jesus, Morton 03/09/2017 10:30 Date of Service: AM Medical Record [id number] Number: Patient Account Number: [id number] Date of Birth/Sex: [birth year] (61 y.o. Male) Treating RN: Ermalinda Barrios, Other Clinician: Primary Care Leona Pressly: Fabio Neighbors Referring Jakeisha Stricker: Bluford Main Chukwuem[place]a Artola/Extender: Weeks in Treatment: 0 Electronic Signature(s) Signed: 03/10/2017 4:51:13 PM By: Alejandro Mulling Entered By: Alejandro Mulling on 03/09/2017 10:57:39 Jesus, Morton ([id number]) -------------------------------------------------------------------------------- Multi Wound Chart Details Patient Name: Jesus, Morton 03/09/2017 10:30 Date of Service: AM Medical Record [id number] Number: Patient Account Number: [id number] Date of Birth/Sex: 1956/05/07 (61 y.o. Male) Treating RN: Ermalinda Barrios, Other Clinician: Primary Care Caprisha Bridgett: Fabio Neighbors Referring Omarion Minnehan: Bluford Main Kynedi Profitt/Extender: Weeks in Treatment: 0 Vital Signs Height(in): 60 Pulse(bpm): 62 Weight(lbs): 92 Blood Pressure 101/89 (mmHg): Body  Mass Index(BMI): 18 Temperature(F): 97.8 Respiratory Rate 16 (breaths/min): Photos: [1:No Photos] [N/A:N/A] Wound Location: [1:Right Sacrum] [N/A:N/A] Wounding Event: [1:Pressure Injury] [N/A:N/A] Primary Etiology: [1:Pressure Ulcer] [N/A:N/A] Comorbid History: [1:Anemia, Hypotension, Dementia] [N/A:N/A] Date Acquired: [1:02/16/2017] [N/A:N/A] Weeks of Treatment: [1:0] [N/A:N/A] Wound Status: [1:Open] [N/A:N/A] Measurements L x W x D 5.2x2.5x0.1 [N/A:N/A] (cm) Area (cm) : [1:10.21] [N/A:N/A] Volume (cm) : [1:1.021] [N/A:N/A] % Reduction in Area: [1:0.00%] [N/A:N/A] % Reduction in Volume: 0.00% [N/A:N/A] Classification: [1:Category/Stage III] [N/A:N/A] Exudate Amount: [1:Large] [N/A:N/A] Exudate Type: [1:Serous] [N/A:N/A] Exudate Color: [1:amber] [N/A:N/A] Wound Margin: [1:Flat and Intact] [N/A:N/A] Granulation Amount: [1:Large (67-100%)] [N/A:N/A] Granulation Quality: [1:Red, Pink] [N/A:N/A] Necrotic Amount: [1:Small (1-33%)] [N/A:N/A] Necrotic Tissue: [1:Eschar, Adherent Slough] [N/A:N/A] Epithelialization: [1:None] [N/A:N/A] Periwound Skin Texture: No Abnormalities Noted [1:Maceration: Yes] [N/A:N/A N/A] Periwound Skin Moisture: Periwound Skin Color: Erythema:  Yes N/A N/A Erythema Location: Circumferential N/A N/A Temperature: No Abnormality N/A N/A Tenderness on Yes N/A N/A Palpation: Wound Preparation: Ulcer Cleansing: N/A N/A Rinsed/Irrigated with Saline Topical Anesthetic Applied: Other: lidocaine 4% Treatment Notes Wound #1 (Right Sacrum) 1. Cleansed with: Clean wound with Normal Saline 2. Anesthetic Topical Lidocaine 4% cream to wound bed prior to debridement 3. Peri-wound Care: Skin Prep 4. Dressing Applied: Aquacel Ag 5. Secondary Dressing Applied Bordered Foam Dressing Electronic Signature(s) Signed: 03/09/2017 4:28:32 PM By: Evlyn Kanner MD, FACS Entered By: Evlyn Kanner on 03/09/2017 11:51:05 Jesus, Morton  ([id number]) -------------------------------------------------------------------------------- Multi-Disciplinary Care Plan Details Patient Name: Jesus, Morton 03/09/2017 10:30 Date of Service: AM Medical Record [id number] Number: Patient Account Number: [id number] Date of Birth/Sex: October 12, 1955 (61 y.o. Male) Treating RN: Ermalinda Barrios, Other Clinician: Primary Care Mariya Mottley: Fabio Neighbors Referring Demetrio Leighty: Bluford Main Bardia Wangerin/Extender: Weeks in Treatment: 0 Active Inactive ` Abuse / Safety / Falls / Self Care Management Nursing Diagnoses: Potential for falls Goals: Patient will not experience any injury related to falls Date Initiated: 03/09/2017 Target Resolution Date: 06/17/2017 Goal Status: Active Interventions: Assess Activities of Daily Living upon admission and as needed Assess fall risk on admission and as needed Assess: immobility, friction, shearing, incontinence upon admission and as needed Assess impairment of mobility on admission and as needed per policy Notes: ` Nutrition Nursing Diagnoses: Imbalanced nutrition Potential for alteratiion in Nutrition/Potential for imbalanced nutrition Goals: Patient/caregiver agrees to and verbalizes understanding of need to use nutritional supplements and/or vitamins as prescribed Date Initiated: 03/09/2017 Target Resolution Date: 06/17/2017 Goal Status: Active Interventions: Assess patient nutrition upon admission and as needed per policy Provide education on nutrition Jesus, Morton ([id number]) Notes: ` Orientation to the Wound Care Program Nursing Diagnoses: Knowledge deficit related to the wound healing center program Goals: Patient/caregiver will verbalize understanding of the Wound Healing Center Program Date Initiated: 03/09/2017 Target Resolution Date: 03/18/2017 Goal Status: Active Interventions: Provide education on orientation to the wound center Notes: ` Pain,  Acute or Chronic Nursing Diagnoses: Pain, acute or chronic: actual or potential Potential alteration in comfort, pain Goals: Patient/caregiver will verbalize adequate pain control between visits Date Initiated: 03/09/2017 Target Resolution Date: 06/17/2017 Goal Status: Active Interventions: Complete pain assessment as per visit requirements Notes: ` Pressure Nursing Diagnoses: Knowledge deficit related to causes and risk factors for pressure ulcer development Knowledge deficit related to management of pressures ulcers Potential for impaired tissue integrity related to pressure, friction, moisture, and shear Goals: Patient will remain free from development of additional pressure ulcers Date Initiated: 03/09/2017 Target Resolution Date: 06/17/2017 Goal Status: Active Jesus, Morton ([id number]) Interventions: Assess: immobility, friction, shearing, incontinence upon admission and as needed Provide education on pressure ulcers Notes: Electronic Signature(s) Signed: 03/10/2017 4:51:13 PM By: Alejandro Mulling Entered By: Alejandro Mulling on 03/09/2017 11:16:12 Jesus Morton ([id number]) -------------------------------------------------------------------------------- Pain Assessment Details Patient Name: Jesus Morton Date of Service: 03/09/2017 10:30 AM Medical Record Patient Account Number: [id number] Number: Treating RN: Phillis Haggis Date of Birth/Sex: [birth year] (61 y.o. Male) Other Clinician: Collier Flowers Primary Care Pavan Bring: Jesus Ana Suda Forbess/Extender: Jesus Morton Referring Tamaria Dunleavy: Irven Coe in Treatment: 0 Active Problems Location of Pain Severity and Description of Pain Patient Has Paino Yes Site Locations Pain Location: Pain in Ulcers Character of Pain Describe the Pain: Burning Pain Management and Medication Current Pain Management: Notes pt unable to use pain scale Electronic Signature(s) Signed: 03/10/2017  4:51:13 PM By: Alejandro Mulling Entered By: Alejandro Mulling on 03/09/2017 10:46:47 Whitmyer, Emiel ([id number]) -------------------------------------------------------------------------------- Patient/Caregiver  Education Details Patient Name: Jesus LittlerANCE, Davyon 03/09/2017 10:30 Date of Service: AM Medical Record [id number] Number: Patient Account Number: [id number] Date of Birth/Gender: 05/16/1956 (61 y.o. Male) Treating RN: Ashok CordiaPinkerton, Debi Primary Care MCLEAN-SCOCOZZA, Other Clinician: Physician: Fabio NeighborsRACY Treating Britto, Errol Referring Physician: Bluford Mainejan-Sie, Sheikh Physician/Extender: Tania AdeWeeks in Treatment: 0 Education Assessment Education Provided To: Patient and Caregiver Education Topics Provided Nutrition: Handouts: Nutrition Methods: Explain/Verbal Responses: State content correctly Pressure: Handouts: Pressure Ulcers: Care and Offloading Methods: Explain/Verbal Responses: State content correctly Welcome To [facility]: Handouts: Welcome To [facility] Methods: Explain/Verbal Responses: State content correctly Wound/Skin Impairment: Handouts: Other: change dressing as ordered Methods: Demonstration, Explain/Verbal Responses: State content correctly Electronic Signature(s) Signed: 03/10/2017 4:51:13 PM By: Alejandro MullingPinkerton, Debra Entered By: Alejandro MullingPinkerton, Debra on 03/09/2017 11:35:05 Jesus LittlerNANCE, Dwan ([id number]) -------------------------------------------------------------------------------- Wound Assessment Details Patient Name: Jesus LittlerANCE, Rutledge 03/09/2017 10:30 Date of Service: AM Medical Record [id number] Number: Patient Account Number: [id number] Date of Birth/Sex: 05/16/1956 (61 y.o. Male) Treating RN: Ermalinda BarriosPinkerton, Debi MCLEAN-SCOCOZZA, Other Clinician: Primary Care Dastan Krider: Fabio NeighborsRACY Treating Britto, Errol Referring Yalonda Sample: Bluford Mainejan-Sie, Sheikh Regan Llorente/Extender: Weeks in Treatment: 0 Wound Status Wound Number: 1 Primary Etiology: Pressure Ulcer Wound  Location: Right Sacrum Wound Status: Open Wounding Event: Pressure Injury Comorbid History: Anemia, Hypotension, Dementia Date Acquired: 02/16/2017 Weeks Of Treatment: 0 Clustered Wound: No Photos Photo Uploaded By: Alejandro MullingPinkerton, Debra on 03/09/2017 13:20:40 Wound Measurements Length: (cm) 5.2 Width: (cm) 2.5 Depth: (cm) 0.1 Area: (cm) 10.21 Volume: (cm) 1.021 % Reduction in Area: 0% % Reduction in Volume: 0% Epithelialization: None Tunneling: No Undermining: No Wound Description Classification: Category/Stage III Wound Margin: Flat and Intact Exudate Amount: Large Exudate Type: Serous Exudate Color: amber Foul Odor After Cleansing: No Slough/Fibrino Yes Wound Bed Granulation Amount: Large (67-100%) Granulation Quality: Red, Pink Necrotic Amount: Small (1-33%) Swalley, Dorsey ([id number]) Necrotic Quality: Eschar, Adherent Slough Periwound Skin Texture Texture Color No Abnormalities Noted: No No Abnormalities Noted: No Erythema: Yes Moisture Erythema Location: Circumferential No Abnormalities Noted: No Maceration: Yes Temperature / Pain Temperature: No Abnormality Tenderness on Palpation: Yes Wound Preparation Ulcer Cleansing: Rinsed/Irrigated with Saline Topical Anesthetic Applied: Other: lidocaine 4%, Assessment Notes Upon cleaning with gauze and saline a film came off the wound. Treatment Notes Wound #1 (Right Sacrum) 1. Cleansed with: Clean wound with Normal Saline 2. Anesthetic Topical Lidocaine 4% cream to wound bed prior to debridement 3. Peri-wound Care: Skin Prep 4. Dressing Applied: Aquacel Ag 5. Secondary Dressing Applied Bordered Foam Dressing Electronic Signature(s) Signed: 03/10/2017 4:51:13 PM By: Alejandro MullingPinkerton, Debra Entered By: Alejandro MullingPinkerton, Debra on 03/09/2017 13:05:14 Jesus LittlerANCE, Ranulfo ([id number]) -------------------------------------------------------------------------------- Vitals Details Patient Name: Jesus LittlerNANCE, Nicklaus Date of Service:  03/09/2017 10:30 AM Medical Record Patient Account Number: [id number] Number: Treating RN: Phillis HaggisPinkerton, Debi Date of Birth/Sex: 05/16/1956 (61 y.o. Male) Other Clinician: Collier FlowersMCLEAN-SCOCOZZA, Treating ROBSON, MICHAEL Primary Care Sherrin Stahle: TRACY Vida Nicol/Extender: Jesus Morton Referring Jaquisha Frech: Irven Coeejan-Sie, Sheikh Weeks in Treatment: 0 Vital Signs Time Taken: 10:46 Temperature (F): 97.8 Height (in): 60 Pulse (bpm): 62 Source: Stated Respiratory Rate (breaths/min): 16 Weight (lbs): 92 Blood Pressure (mmHg): 101/89 Source: Measured Reference Range: 80 - 120 mg / dl Body Mass Index (BMI): 18 Electronic Signature(s) Signed: 03/10/2017 4:51:13 PM By: Alejandro MullingPinkerton, Debra Entered By: Alejandro MullingPinkerton, Debra on 03/09/2017 10:50:36

## 2017-03-12 NOTE — Progress Notes (Signed)
Jesus Morton, Jesus Morton ([id number]) Visit Report for 03/09/2017 Abuse/Suicide Risk Screen Details Patient Name: Jesus Morton, Jesus Morton 03/09/2017 10:30 Date of Service: AM Medical Record [id number] Number: Patient Account Number: [id number] Date of Birth/Sex: 1956-05-17 (61 y.o. Male) Treating RN: Ermalinda BarriosPinkerton, Debi MCLEAN-SCOCOZZA, Other Clinician: Primary Care Garry Bochicchio: Merilynn FinlandRACY Treating Britto, Errol Referring Yona Kosek: Bluford Mainejan-Sie, Sheikh Shametra Cumberland/Extender: Weeks in Treatment: 0 Abuse/Suicide Risk Screen Items Answer ABUSE/SUICIDE RISK SCREEN: Has anyone close to you tried to hurt or harm you recentlyo No Do you feel uncomfortable with anyone in your familyo No Has anyone forced you do things that you didnot want to doo No Patient displays signs or symptoms of abuse and/or neglect. No Electronic Signature(s) Signed: 03/10/2017 4:51:13 PM By: Alejandro MullingPinkerton, Debra Entered By: Alejandro MullingPinkerton, Debra on 03/09/2017 10:55:23 Jesus Morton, Jesus Morton ([id number]) -------------------------------------------------------------------------------- Activities of Daily Living Details Patient Name: Jesus Morton, Jesus Morton 03/09/2017 10:30 Date of Service: AM Medical Record [id number] Number: Patient Account Number: [id number] Date of Birth/Sex: 1956-05-17 (61 y.o. Male) Treating RN: Ermalinda BarriosPinkerton, Debi MCLEAN-SCOCOZZA, Other Clinician: Primary Care Dalessandro Baldyga: Fabio NeighborsRACY Treating Britto, Errol Referring Arnell Slivinski: Bluford Mainejan-Sie, Sheikh Zorawar Strollo/Extender: Weeks in Treatment: 0 Activities of Daily Living Items Answer Activities of Daily Living (Please select one for each item) Drive Automobile Not Able Take Medications Not Able Use Telephone Not Able Care for Appearance Not Able Use Toilet Not Able Mady HaagensenBath / Shower Not Able Dress Self Not Able Feed Self Completely Able Walk Not Able Get In / Out Bed Not Able Housework Not Able Prepare Meals Not Able Handle Money Not Able Shop for Self Not Able Electronic Signature(s) Signed: 03/10/2017 4:51:13  PM By: Alejandro MullingPinkerton, Debra Entered By: Alejandro MullingPinkerton, Debra on 03/09/2017 10:55:55 Jesus Morton, Jesus Morton ([id number]) -------------------------------------------------------------------------------- Education Assessment Details Patient Name: Jesus Morton, Jesus Morton 03/09/2017 10:30 Date of Service: AM Medical Record [id number] Number: Patient Account Number: [id number] Date of Birth/Sex: 1956-05-17 (61 y.o. Male) Treating RN: Ermalinda BarriosPinkerton, Debi MCLEAN-SCOCOZZA, Other Clinician: Primary Care Wilberta Dorvil: Fabio NeighborsRACY Treating Britto, Errol Referring Tishara Pizano: Bluford Mainejan-Sie, Sheikh Gaylin Bulthuis/Extender: Weeks in Treatment: 0 Primary Learner Assessed: Caregiver downs synrome, Reason Patient is not Primary Learner: dementia Learning Preferences/Education Level/Primary Language Learning Preference: Explanation, Printed Material Highest Education Level: High School Preferred Language: English Cognitive Barrier Assessment/Beliefs Language Barrier: No Translator Needed: No Memory Deficit: No Emotional Barrier: No Cultural/Religious Beliefs Affecting Medical No Care: Physical Barrier Assessment Impaired Vision: No Impaired Hearing: No Decreased Hand dexterity: No Knowledge/Comprehension Assessment Knowledge Level: Medium Comprehension Level: Medium Ability to understand written Medium instructions: Ability to understand verbal Medium instructions: Motivation Assessment Anxiety Level: Calm Cooperation: Cooperative Education Importance: Acknowledges Need Interest in Health Problems: Asks Questions Perception: Coherent Willingness to Engage in Self- Medium Management Activities: Jesus Morton, Jesus Morton ([id number]) Readiness to Engage in Self- Medium Management Activities: Electronic Signature(s) Signed: 03/10/2017 4:51:13 PM By: Alejandro MullingPinkerton, Debra Entered By: Alejandro MullingPinkerton, Debra on 03/09/2017 10:56:35 Jesus Morton, Jesus Morton ([id number]) -------------------------------------------------------------------------------- Fall Risk  Assessment Details Patient Name: Jesus Morton, Jesus Morton 03/09/2017 10:30 Date of Service: AM Medical Record [id number] Number: Patient Account Number: [id number] Date of Birth/Sex: 1956-05-17 (61 y.o. Male) Treating RN: Ermalinda BarriosPinkerton, Debi MCLEAN-SCOCOZZA, Other Clinician: Primary Care Mahitha Hickling: Fabio NeighborsRACY Treating Britto, Errol Referring Emmani Lesueur: Bluford Mainejan-Sie, Sheikh Lamia Mariner/Extender: Weeks in Treatment: 0 Fall Risk Assessment Items Have you had 2 or more falls in the last 12 monthso 0 No Have you had any fall that resulted in injury in the last 12 monthso 0 No FALL RISK ASSESSMENT: History of falling - immediate or within 3 months 0 No Secondary diagnosis 15 Yes Ambulatory aid None/bed rest/wheelchair/nurse 0 Yes Crutches/cane/walker 15 Yes Furniture 0 No IV Access/Saline Lock 0 No Gait/Training Normal/bed  rest/immobile 0 No Weak 10 Yes Impaired 20 Yes Mental Status Oriented to own ability 0 No Electronic Signature(s) Signed: 03/10/2017 4:51:13 PM By: Alejandro Mulling Entered By: Alejandro Mulling on 03/09/2017 10:57:01 Jesus Littler ([id number]) -------------------------------------------------------------------------------- Foot Assessment Details Patient Name: Jesus Morton, Jesus Morton 03/09/2017 10:30 Date of Service: AM Medical Record [id number] Number: Patient Account Number: [id number] Date of Birth/Sex: 03-14-1956 (61 y.o. Male) Treating RN: Ermalinda Barrios, Other Clinician: Primary Care Sheniece Ruggles: Fabio Neighbors Referring Eline Geng: Bluford Main Eddi Hymes/Extender: Weeks in Treatment: 0 Foot Assessment Items Site Locations + = Sensation present, - = Sensation absent, C = Callus, U = Ulcer R = Redness, W = Warmth, M = Maceration, PU = Pre-ulcerative lesion F = Fissure, S = Swelling, D = Dryness Assessment Right: Left: Other Deformity: No No Prior Foot Ulcer: No No Prior Amputation: No No Charcot Joint: No No Ambulatory  Status: Gait: Electronic Signature(s) Signed: 03/10/2017 4:51:13 PM By: Alejandro Mulling Entered By: Alejandro Mulling on 03/09/2017 10:57:28 Jesus Morton, Gaynell Face ([id number]) -------------------------------------------------------------------------------- Nutrition Risk Assessment Details Patient Name: ALISTER, STAVER 03/09/2017 10:30 Date of Service: AM Medical Record [id number] Number: Patient Account Number: [id number] Date of Birth/Sex: [birth year] (61 y.o. Male) Treating RN: Ermalinda Barrios, Other Clinician: Primary Care Nilo Fallin: Fabio Neighbors Referring Sruti Ayllon: Bluford Main Jerold Yoss/Extender: Weeks in Treatment: 0 Height (in): 60 Weight (lbs): 92 Body Mass Index (BMI): 18 Nutrition Risk Assessment Items NUTRITION RISK SCREEN: I have an illness or condition that made me change the kind and/or 2 Yes amount of food I eat I eat fewer than two meals per day 0 No I eat few fruits and vegetables, or milk products 0 No I have three or more drinks of beer, liquor or wine almost every day 0 No I have tooth or mouth problems that make it hard for me to eat 0 No I don't always have enough money to buy the food I need 0 No I eat alone most of the time 0 No I take three or more different prescribed or over-the-counter drugs a 1 Yes day Without wanting to, I have lost or gained 10 pounds in the last six 2 Yes months I am not always physically able to shop, cook and/or feed myself 2 Yes Nutrition Protocols Good Risk Protocol Moderate Risk Protocol Electronic Signature(s) Signed: 03/10/2017 4:51:13 PM By: Alejandro Mulling Entered By: Alejandro Mulling on 03/09/2017 10:57:20

## 2017-03-12 NOTE — Progress Notes (Signed)
Jesus Morton ([id number]) Visit Report for 03/09/2017 Chief Complaint Document Details Patient Name: Jesus Morton, Jesus Morton 03/09/2017 10:30 Date of Service: AM Medical Record [id number] Number: Patient Account Number: [id number] Date of Birth/Sex: 12/08/1955 (61 y.o. Male) Treating RN: Jesus Morton, Other Clinician: Primary Care Provider: Fabio Morton Referring Provider: Bluford Morton Provider/Extender: Jesus Morton in Treatment: 0 Information Obtained from: Patient Chief Complaint Patient is at the clinic for treatment of an open pressure ulcer to the right medial gluteal and sacral area which she's had for about 3 Jesus Morton Electronic Signature(s) Signed: 03/09/2017 4:28:32 PM By: Jesus Kanner MD, FACS Entered By: Jesus Morton on 03/09/2017 11:51:26 Jesus Morton, Jesus Morton ([id number]) -------------------------------------------------------------------------------- HPI Details Patient Name: Jesus Morton 03/09/2017 10:30 Date of Service: AM Medical Record [id number] Number: Patient Account Number: [id number] Date of Birth/Sex: October 10, 1955 (61 y.o. Male) Treating RN: Jesus Morton, Other Clinician: Primary Care Provider: Fabio Morton Referring Provider: Bluford Morton Provider/Extender: Jesus Morton in Treatment: 0 History of Present Illness Location: right gluteal and sacral area Quality: Patient reports experiencing a dull pain to affected area(s). Severity: Patient states wound are getting worse. Duration: Patient has had the wound for < 3 Jesus Morton prior to presenting for treatment Timing: Pain in wound is Intermittent (comes and goes Context: The wound would happen gradually Modifying Factors: Other treatment(s) tried include:oral antibiotics and local care Associated Signs and Symptoms: Patient reports presence of swelling HPI Description: 61 year old male with Down syndrome and dementia has been referred to [place] for a  sacral decubitus ulcer. Past medical history significant for GERD, dementia, Down syndrome, status post hiatal hernia repair in June 2015, repair of incarcerated direct inguinal hernia in August 2015, colonoscopy and EGD in the past. On a recent examination his PCP Dr. Richardson Morton had found a stage III pressure ulcer of the sacrum and referred him to the wound clinic. Electronic Signature(s) Signed: 03/09/2017 4:28:32 PM By: Jesus Kanner MD, FACS Entered By: Jesus Morton on 03/09/2017 11:52:17 Jesus Morton, Jesus Morton ([id number]) -------------------------------------------------------------------------------- Physical Exam Details Patient Name: Jesus Morton, Jesus Morton 03/09/2017 10:30 Date of Service: AM Medical Record [id number] Number: Patient Account Number: [id number] Date of Birth/Sex: 1955/08/03 (61 y.o. Male) Treating RN: Jesus Morton, Other Clinician: Primary Care Provider: Fabio Morton Referring Provider: Bluford Morton Provider/Extender: Jesus Morton in Treatment: 0 Constitutional . Pulse regular. Respirations normal and unlabored. Afebrile. . Eyes Nonicteric. Reactive to light. Ears, Nose, Mouth, and Throat Lips, teeth, and gums WNL.[name] Moist mucosa without lesions. Neck supple and nontender. No palpable supraclavicular or cervical adenopathy. Normal sized without goiter. Respiratory WNL. No retractions.. Cardiovascular Pedal Pulses WNL. No clubbing, cyanosis or edema. Gastrointestinal (GI) Abdomen without masses or tenderness.. No liver or spleen enlargement or tenderness.. Lymphatic No adneopathy. No adenopathy. No adenopathy. Musculoskeletal Adexa without tenderness or enlargement.. Digits and nails w/o clubbing, cyanosis, infection, petechiae, ischemia, or inflammatory conditions.. Integumentary (Hair, Skin) No suspicious lesions. No crepitus or fluctuance. No peri-wound warmth or erythema. No masses.[name] Psychiatric Judgement and insight Intact.. No  evidence of depression, anxiety, or agitation.. Notes in the right medial gluteal area and sacral region he has a stage III rash or ulcer with no surrounding cellulitis or no evidence of significant depth. No sharp debridement was required today Electronic Signature(s) Signed: 03/09/2017 4:28:32 PM By: Jesus Kanner MD, FACS Entered By: Jesus Morton on 03/09/2017 11:52:50 Jesus Morton, Jesus Morton ([id number]) -------------------------------------------------------------------------------- Physician Orders Details Patient Name: Jesus Morton, Jesus Morton 03/09/2017 10:30 Date of Service: AM Medical Record [id number] Number: Patient Account Number: [id number] Date of Birth/Sex: 1956/01/25 (61 y.o. Male) Treating  RN: Jesus BarriosPinkerton, Debi MCLEAN-SCOCOZZA, Other Clinician: Primary Care Provider: Fabio NeighborsRACY Treating [name] Referring Provider: Bluford Mainejan-Sie, Sheikh Provider/Extender: Jesus Morton in Treatment: 0 Verbal / Phone Orders: No Diagnosis Coding Wound Cleansing Wound #1 Right Sacrum o Clean wound with Normal Saline. o Cleanse wound with mild soap and water o May Shower, gently pat wound dry prior to applying new dressing. - Pt may shower every other day before dressing changes unless soiled. Anesthetic Wound #1 Right Sacrum o Topical Lidocaine 4% cream applied to wound bed prior to debridement - for clinic use Skin Barriers/Peri-Wound Care Wound #1 Right Sacrum o Skin Prep Primary Wound Dressing Wound #1 Right Sacrum o Aquacel Ag - silver alginate Secondary Dressing Wound #1 Right Sacrum o Dry Gauze o Boardered Foam Dressing Dressing Change Frequency Wound #1 Right Sacrum o Change dressing every other day. o Other: - change PRN as soiled Follow-up Appointments Wound #1 Right Sacrum o Return Appointment in 1 week. [name], [name] ([id number]) Off-Loading Wound #1 Right Sacrum o Turn and reposition every 2 hours Additional Orders / Instructions Wound #1 Right Sacrum o  Increase protein intake. o Activity as tolerated Medications-please add to medication list. Wound #1 Right Sacrum o Other: - Take Vitamin A, Vitamin C, Zinc, Multivitamin Electronic Signature(s) Signed: 03/09/2017 4:28:32 PM By: Jesus KannerBritto, Nolyn Swab MD, FACS Signed: 03/10/2017 4:51:13 PM By: Alejandro MullingPinkerton, Debra Entered By: Alejandro MullingPinkerton, Debra on 03/09/2017 11:38:15 Jesus LittlerANCE, Curtis ([id number]) -------------------------------------------------------------------------------- Problem List Details Patient Name: Jesus LittlerANCE, Savalas 03/09/2017 10:30 Date of Service: AM Medical Record [id number] Number: Patient Account Number: [id number] Date of Birth/Sex: 09-09-1955 (61 y.o. Male) Treating RN: Jesus BarriosPinkerton, Debi MCLEAN-SCOCOZZA, Other Clinician: Primary Care Provider: Fabio NeighborsRACY Treating Janney Priego Referring Provider: Bluford Mainejan-Sie, Sheikh Provider/Extender: Jesus Morton in Treatment: 0 Active Problems ICD-10 Encounter Code Description Active Date Diagnosis Q90.9 Down syndrome, unspecified 03/09/2017 Yes L89.313 Pressure ulcer of right buttock, stage 3 03/09/2017 Yes L89.153 Pressure ulcer of sacral region, stage 3 03/09/2017 Yes E44.1 Mild protein-calorie malnutrition 03/09/2017 Yes Inactive Problems Resolved Problems Electronic Signature(s) Signed: 03/09/2017 4:28:32 PM By: Jesus KannerBritto, Diyan Dave MD, FACS Entered By: Jesus KannerBritto, Momoka Stringfield on 03/09/2017 11:50:59 Jesus LittlerNANCE, Jeronimo ([id number]) -------------------------------------------------------------------------------- Progress Note Details Patient Name: Jesus LittlerANCE, Jesus Morton 03/09/2017 10:30 Date of Service: AM Medical Record [id number] Number: Patient Account Number: [id number] Date of Birth/Sex: 09-09-1955 (61 y.o. Male) Treating RN: Jesus BarriosPinkerton, Debi MCLEAN-SCOCOZZA, Other Clinician: Primary Care Provider: Merilynn FinlandRACY Treating Aksel Bencomo Referring Provider: Bluford Mainejan-Sie, Sheikh Provider/Extender: Jesus Morton in Treatment: 0 Subjective Chief Complaint Information obtained from  Patient Patient is at the clinic for treatment of an open pressure ulcer to the right medial gluteal and sacral area which she's had for about 3 Jesus Morton History of Present Illness (HPI) The following HPI elements were documented for the patient's wound: Location: right gluteal and sacral area Quality: Patient reports experiencing a dull pain to affected area(s). Severity: Patient states wound are getting worse. Duration: Patient has had the wound for < 3 Jesus Morton prior to presenting for treatment Timing: Pain in wound is Intermittent (comes and goes Context: The wound would happen gradually Modifying Factors: Other treatment(s) tried include:oral antibiotics and local care Associated Signs and Symptoms: Patient reports presence of swelling 61 year old male with Down syndrome and dementia has been referred to us for a sacral decubitus ulcer. Past medical history significant for GERD, dementia, Down syndrome, status post hiatal hernia repair in June 2015, repair of incarcerated direct inguinal hernia in August 2015, colonoscopy and EGD in the past. On a recent examination his PCP Dr. Richardson ChiquitoShaikh had found a stage III pressure ulcer of the sacrum  and referred him to the wound clinic. Wound History Patient presents with 1 open wound that has been present for approximately 3 Jesus Morton. Patient has been treating wound in the following manner: wet to dry. Laboratory tests have not been performed in the last month. Patient reportedly has not tested positive for an antibiotic resistant organism. Patient reportedly has not tested positive for osteomyelitis. Patient reportedly has not had testing performed to evaluate circulation in the legs. Patient History Information obtained from Caregiver. Allergies NKDA Jesus Morton, Jesus Morton ([id number]) Family History Cancer - Paternal Grandparents, Heart Disease - Mother, Father, Maternal Grandparents, Stroke - Mother, No family history of Diabetes, Hereditary Spherocytosis,  Hypertension, Kidney Disease, Lung Disease, Seizures, Thyroid Problems, Tuberculosis. Social History Never smoker, Marital Status - Single, Alcohol Use - Never, Drug Use - No History, Caffeine Use - Daily. Medical History Hematologic/Lymphatic Patient has history of Anemia Cardiovascular Patient has history of Hypotension Neurologic Patient has history of Dementia Review of Systems (ROS) Respiratory recurrent aspiration pna Gastrointestinal gerd Integumentary (Skin) hx cellulitis right arm hx cellulitis right hand hx cellulitis and abcess of leg Musculoskeletal osteoporosis Neurologic down syndrome Medications Align isopto tears/goniovisc opthalmic solution opthalmic Lacrilube opthalmic ointment opthalmic Namenda 10 mg tablet oral 2 2 tablet oral (20 mg.) acetaminophen 500 mg tablet oral tablet oral lorazepam 0.5 mg tablet oral tablet oral loratadine 10 mg tablet oral tablet oral Delsym 12 hour 30 mg/5 mL oral suspension,extended release oral suspension,extended rel 12 hr oral sucralfate 100 mg/mL oral suspension oral suspension oral calcium carbonate 600 mg calcium (1,500 mg) tablet oral tablet oral Mucinex 600 mg tablet, extended release oral tablet extended release 12hr oral polyethylene glycol 3350 17 gram oral powder packet oral powder in packet oral Multiple Vitamin-Minerals tablet oral tablet oral sodium chloride 0.65 % nasal spray aerosol nasal aerosol,spray nasal Augmentin ES-600 600 mg-42.9 mg/5 mL oral suspension oral suspension for reconstitution oral Jesus Morton, Jesus Morton ([id number]) melatonin 3 mg tablet oral tablet oral pantoprazole 40 mg tablet,delayed release oral tablet,delayed release (DR/EC) oral naftifine 1 % topical gel topical gel topical nystatin-triamcinolone 100,000 unit/g-0.1 % topical cream topical cream topical Vitamin B-12 1,000 mcg tablet oral tablet oral cholecalciferol (vitamin D3) 1,000 unit tablet oral tablet  oral Objective Constitutional Pulse regular. Respirations normal and unlabored. Afebrile. Vitals Time Taken: 10:46 AM, Height: 60 in, Source: Stated, Weight: 92 lbs, Source: Measured, BMI: 18, Temperature: 97.8 F, Pulse: 62 bpm, Respiratory Rate: 16 breaths/min, Blood Pressure: 101/89 mmHg. Eyes Nonicteric. Reactive to light. Ears, Nose, Mouth, and Throat Lips, teeth, and gums WNL.[name] Moist mucosa without lesions. Neck supple and nontender. No palpable supraclavicular or cervical adenopathy. Normal sized without goiter. Respiratory WNL. No retractions.. Cardiovascular Pedal Pulses WNL. No clubbing, cyanosis or edema. Gastrointestinal (GI) Abdomen without masses or tenderness.. No liver or spleen enlargement or tenderness.. Lymphatic No adneopathy. No adenopathy. No adenopathy. Musculoskeletal Adexa without tenderness or enlargement.. Digits and nails w/o clubbing, cyanosis, infection, petechiae, ischemia, or inflammatory conditions.[name] Psychiatric Judgement and insight Intact.. No evidence of depression, anxiety, or agitation.. General Notes: in the right medial gluteal area and sacral region he has a stage III rash or ulcer with no Jesus Morton, Jesus Morton ([id number]) surrounding cellulitis or no evidence of significant depth. No sharp debridement was required today Integumentary (Hair, Skin) No suspicious lesions. No crepitus or fluctuance. No peri-wound warmth or erythema. No masses.. Wound #1 status is Open. Original cause of wound was Pressure Injury. The wound is located on the Right Sacrum. The wound measures 5.2cm length x 2.5cm width x  0.1cm depth; 10.21cm^2 area and 1.021cm^3 volume. There is no tunneling or undermining noted. There is a large amount of serous drainage noted. The wound margin is flat and intact. There is large (67-100%) red, pink granulation within the wound bed. There is a small (1-33%) amount of necrotic tissue within the wound bed including Eschar and Adherent  Slough. The periwound skin appearance exhibited: Maceration, Erythema. The surrounding wound skin color is noted with erythema which is circumferential. Periwound temperature was noted as No Abnormality. The periwound has tenderness on palpation. General Notes: Upon cleaning with gauze and saline a film came off the wound. Assessment Active Problems ICD-10 Q90.9 - Down syndrome, unspecified L89.313 - Pressure ulcer of right buttock, stage 3 L89.153 - Pressure ulcer of sacral region, stage 3 E44.1 - Mild protein-calorie malnutrition the patient has a stage III pressure injury in the region of his sacral and right gluteal area and he has Down syndrome and has been ambulant with a walker. Most recently his ambulation has been restricted and his caregivers say he eats fairly normally. After review today I have recommended: 1. Silver alginate and a bordered foam, to be changed daily or every other day 2. Offloading has been discussed in great detail 3. Ambulation as much as possible and appropriate posture changes during the day 4. Adequate protein, vitamin A, vitamin C and zinc 5. Regular visits wound center Plan Wound Cleansing: Wound #1 Right Sacrum: Clean wound with Normal Saline. Cleanse wound with mild soap and water May Shower, gently pat wound dry prior to applying new dressing. - Pt may shower every other day before Jesus Morton, Jesus Morton ([id number]) dressing changes unless soiled. Anesthetic: Wound #1 Right Sacrum: Topical Lidocaine 4% cream applied to wound bed prior to debridement - for clinic use Skin Barriers/Peri-Wound Care: Wound #1 Right Sacrum: Skin Prep Primary Wound Dressing: Wound #1 Right Sacrum: Aquacel Ag - silver alginate Secondary Dressing: Wound #1 Right Sacrum: Dry Gauze Boardered Foam Dressing Dressing Change Frequency: Wound #1 Right Sacrum: Change dressing every other day. Other: - change PRN as soiled Follow-up Appointments: Wound #1 Right  Sacrum: Return Appointment in 1 week. Off-Loading: Wound #1 Right Sacrum: Turn and reposition every 2 hours Additional Orders / Instructions: Wound #1 Right Sacrum: Increase protein intake. Activity as tolerated Medications-please add to medication list.: Wound #1 Right Sacrum: Other: - Take Vitamin A, Vitamin C, Zinc, Multivitamin the patient has a stage III pressure injury in the region of his sacral and right gluteal area and he has Down syndrome and has been ambulant with a walker. Most recently his ambulation has been restricted and his caregivers say he eats fairly normally. After review today I have recommended: 1. Silver alginate and a bordered foam, to be changed daily or every other day 2. Offloading has been discussed in great detail 3. Ambulation as much as possible and appropriate posture changes during the day 4. Adequate protein, vitamin A, vitamin C and zinc 5. Regular visits wound center Electronic Signature(s) Signed: 03/09/2017 4:28:32 PM By: Jesus Kanner MD, FACS Jesus Morton, Jesus Morton ([id number]) Entered By: Jesus Morton on 03/09/2017 15:53:58 JASKARAN, DAUZAT ([id number]) -------------------------------------------------------------------------------- ROS/PFSH Details Patient Name: CORBIN, FALCK 03/09/2017 10:30 Date of Service: AM Medical Record [id number] Number: Patient Account Number: [id number] Date of Birth/Sex: 1955-07-30 (61 y.o. Male) Treating RN: Jesus Morton, Other Clinician: Primary Care Provider: Fabio Morton Referring Provider: Bluford Morton Provider/Extender: Jesus Morton in Treatment: 0 Information Obtained From Caregiver Wound History Do you currently have one or more open woundso  Yes How many open wounds do you currently haveo 1 Approximately how long have you had your woundso 3 Jesus Morton How have you been treating your wound(s) until nowo wet to dry Has your wound(s) ever healed and then re-openedo No Have  you had any lab work done in the past montho No Have you tested positive for an antibiotic resistant organism (MRSA, VRE)o No Have you tested positive for osteomyelitis (bone infection)o No Have you had any tests for circulation on your legso No Hematologic/Lymphatic Medical History: Positive for: Anemia Respiratory Complaints and Symptoms: Review of System Notes: recurrent aspiration pna Cardiovascular Medical History: Positive for: Hypotension Gastrointestinal Complaints and Symptoms: Review of System Notes: gerd Integumentary (Skin) Complaints and Symptoms: Review of System Notes: hx cellulitis right arm hx cellulitis right hand Wilner, Thelma ([id number]) hx cellulitis and abcess of leg Musculoskeletal Complaints and Symptoms: Review of System Notes: osteoporosis Neurologic Complaints and Symptoms: Review of System Notes: down syndrome Medical History: Positive for: Dementia Immunizations Pneumococcal Vaccine: Received Pneumococcal Vaccination: Yes Family and Social History Cancer: Yes - Paternal Grandparents; Diabetes: No; Heart Disease: Yes - Mother, Father, Maternal Grandparents; Hereditary Spherocytosis: No; Hypertension: No; Kidney Disease: No; Lung Disease: No; Seizures: No; Stroke: Yes - Mother; Thyroid Problems: No; Tuberculosis: No; Never smoker; Marital Status - Single; Alcohol Use: Never; Drug Use: No History; Caffeine Use: Daily; Financial Concerns: No; Food, Clothing or Shelter Needs: No; Support System Lacking: No; Transportation Concerns: No; Advanced Directives: No; Patient does not want information on Advanced Directives; Do not resuscitate: No; Living Will: Yes (Not Provided); Medical Power of Attorney: Yes - Marthe Patch (Not Provided) Physician Affirmation I have reviewed and agree with the above information. Electronic Signature(s) Signed: 03/09/2017 4:28:32 PM By: Jesus Kanner MD, FACS Signed: 03/10/2017 4:51:13 PM By: Alejandro Mulling Entered  By: Alejandro Mulling on 03/09/2017 11:14:15 TAVEN, STRITE ([id number]) -------------------------------------------------------------------------------- SuperBill Details Patient Name: Jesus Morton Date of Service: 03/09/2017 Medical Record Number: [id number] Patient Account Number: [id number] Date of Birth/Sex: 03-16-1956 (61 y.o. Male) Treating RN: Ashok Cordia, Debi Primary Care Provider: Everardo Pacific Other Clinician: Referring Provider: Bluford Morton Treating Provider/Extender: Rudene Re in Treatment: 0 Diagnosis Coding ICD-10 Codes Code Description Q90.9 Down syndrome, unspecified L89.313 Pressure ulcer of right buttock, stage 3 L89.153 Pressure ulcer of sacral region, stage 3 E44.1 Mild protein-calorie malnutrition Facility Procedures CPT4 Code: [id number] Description: 99214 - WOUND CARE VISIT-LEV 4 EST PT Modifier: Quantity: 1 Physician Procedures CPT4 Code: [id number] Description: 41324 - WC PHYS LEVEL 4 - NEW PT ICD-10 Description Diagnosis Q90.9 Down syndrome, unspecified L89.313 Pressure ulcer of right buttock, stage 3 L89.153 Pressure ulcer of sacral region, stage 3 E44.1 Mild protein-calorie malnutrition Modifier: Quantity: 1 Electronic Signature(s) Signed: 03/09/2017 4:28:32 PM By: Jesus Kanner MD, FACS Entered By: Jesus Morton on 03/09/2017 15:54:13

## 2017-03-16 ENCOUNTER — Encounter: Payer: Medicare Other | Attending: Surgery | Admitting: Surgery

## 2017-03-16 DIAGNOSIS — F0390 Unspecified dementia without behavioral disturbance: Secondary | ICD-10-CM | POA: Insufficient documentation

## 2017-03-16 DIAGNOSIS — L89153 Pressure ulcer of sacral region, stage 3: Secondary | ICD-10-CM | POA: Insufficient documentation

## 2017-03-16 DIAGNOSIS — Q909 Down syndrome, unspecified: Secondary | ICD-10-CM | POA: Diagnosis not present

## 2017-03-16 DIAGNOSIS — Z79899 Other long term (current) drug therapy: Secondary | ICD-10-CM | POA: Insufficient documentation

## 2017-03-16 DIAGNOSIS — E441 Mild protein-calorie malnutrition: Secondary | ICD-10-CM | POA: Diagnosis not present

## 2017-03-16 DIAGNOSIS — K219 Gastro-esophageal reflux disease without esophagitis: Secondary | ICD-10-CM | POA: Insufficient documentation

## 2017-03-16 DIAGNOSIS — D649 Anemia, unspecified: Secondary | ICD-10-CM | POA: Insufficient documentation

## 2017-03-16 DIAGNOSIS — L89313 Pressure ulcer of right buttock, stage 3: Secondary | ICD-10-CM | POA: Insufficient documentation

## 2017-03-16 DIAGNOSIS — M810 Age-related osteoporosis without current pathological fracture: Secondary | ICD-10-CM | POA: Diagnosis not present

## 2017-03-18 NOTE — Progress Notes (Signed)
Char, Salomon ([id number]) Visit Report for 03/16/2017 Arrival Information Details Patient Name: Jesus LittlerANCE, Jesus Morton Date of Service: 03/16/2017 1:45 PM Medical Record Number: [id number] Patient Account Number: [id number] Date of Birth/Sex: 24-Apr-1956 Jesus Morton(61 y.o. Male) Treating RN: Ashok CordiaPinkerton, Debi Primary Care Lidia Clavijo: Everardo PacificMCLEAN-SCOCOZZA, TRACY Other Clinician: Referring Yesica Kemler: Everardo PacificMCLEAN-SCOCOZZA, TRACY Treating Elfreda Blanchet/Extender: Rudene ReBritto, Errol Weeks in Treatment: 1 Visit Information History Since Last Visit All ordered tests and consults were completed: No Patient Arrived: Wheel Chair Added or deleted any medications: No Arrival Time: 13:55 Any new allergies or adverse reactions: No Accompanied By: caregivers Had a fall or experienced change in No Transfer Assistance: EasyPivot activities of daily living that may affect Patient Lift risk of falls: Patient Requires Transmission- No Signs or symptoms of abuse/neglect since last No Based Precautions: visito Patient Has Alerts: No Hospitalized since last visit: No Has Dressing in Place as Prescribed: Yes Pain Present Now: No Electronic Signature(s) Signed: 03/16/2017 4:45:09 PM By: Alejandro MullingPinkerton, Debra Entered By: Alejandro MullingPinkerton, Debra on 03/16/2017 14:01:22 Jesus LittlerNANCE, Roshard ([id number]) -------------------------------------------------------------------------------- Clinic Level of Care Assessment Details Patient Name: Jesus LittlerNANCE, Rayquan Date of Service: 03/16/2017 1:45 PM Medical Record Number: [id number] Patient Account Number: [id number] Date of Birth/Sex: 24-Apr-1956 (61 y.o. Male) Treating RN: Ashok CordiaPinkerton, Debi Primary Care Christinna Sprung: Everardo PacificMCLEAN-SCOCOZZA, TRACY Other Clinician: Referring Yordy Matton: Everardo PacificMCLEAN-SCOCOZZA, TRACY Treating Dasha Kawabata/Extender: Rudene ReBritto, Errol Weeks in Treatment: 1 Clinic Level of Care Assessment Items TOOL 4 Quantity Score X - Use when only an EandM is performed on FOLLOW-UP visit 1 0 ASSESSMENTS - Nursing Assessment / Reassessment X  - Reassessment of Co-morbidities (includes updates in patient status) 1 10 X - Reassessment of Adherence to Treatment Plan 1 5 ASSESSMENTS - Wound and Skin Assessment / Reassessment X - Simple Wound Assessment / Reassessment - one wound 1 5 []  - Complex Wound Assessment / Reassessment - multiple wounds 0 []  - Dermatologic / Skin Assessment (not related to wound area) 0 ASSESSMENTS - Focused Assessment []  - Circumferential Edema Measurements - multi extremities 0 []  - Nutritional Assessment / Counseling / Intervention 0 []  - Lower Extremity Assessment (monofilament, tuning fork, pulses) 0 []  - Peripheral Arterial Disease Assessment (using hand held doppler) 0 ASSESSMENTS - Ostomy and/or Continence Assessment and Care []  - Incontinence Assessment and Management 0 []  - Ostomy Care Assessment and Management (repouching, etc.) 0 PROCESS - Coordination of Care []  - Simple Patient / Family Education for ongoing care 0 X - Complex (extensive) Patient / Family Education for ongoing care 1 20 X - Staff obtains ChiropractorConsents, Records, Test Results / Process Orders 1 10 X - Staff telephones HHA, Nursing Homes / Clarify orders / etc 1 10 []  - Routine Transfer to another Facility (non-emergent condition) 0 Hestand, Erskin ([id number]) []  - Routine Hospital Admission (non-emergent condition) 0 []  - New Admissions / Manufacturing engineernsurance Authorizations / Ordering NPWT, Apligraf, etc. 0 []  - Emergency Hospital Admission (emergent condition) 0 X - Simple Discharge Coordination 1 10 []  - Complex (extensive) Discharge Coordination 0 PROCESS - Special Needs []  - Pediatric / Minor Patient Management 0 []  - Isolation Patient Management 0 []  - Hearing / Language / Visual special needs 0 []  - Assessment of Community assistance (transportation, D/C planning, etc.) 0 []  - Additional assistance / Altered mentation 0 []  - Support Surface(s) Assessment (bed, cushion, seat, etc.) 0 INTERVENTIONS - Wound Cleansing / Measurement X -  Simple Wound Cleansing - one wound 1 5 []  - Complex Wound Cleansing - multiple wounds 0 X - Wound Imaging (photographs - any number of wounds) 1 5 []  -  Wound Tracing (instead of photographs) 0 X - Simple Wound Measurement - one wound 1 5  - Complex Wound Measurement - multiple wounds 0 INTERVENTIONS - Wound Dressings X - Small Wound Dressing one or multiple wounds 1 10  - Medium Wound Dressing one or multiple wounds 0  - Large Wound Dressing one or multiple wounds 0 X - Application of Medications - topical 1 5  - Application of Medications - injection 0 INTERVENTIONS - Miscellaneous  - External ear exam 0 Lobos, Zyhir ([id number])  - Specimen Collection (cultures, biopsies, blood, body fluids, etc.) 0  - Specimen(s) / Culture(s) sent or taken to Lab for analysis 0  - Patient Transfer (multiple staff / Michiel Sites Lift / Similar devices) 0  - Simple Staple / Suture removal (25 or less) 0  - Complex Staple / Suture removal (26 or more) 0  - Hypo / Hyperglycemic Management (close monitor of Blood Glucose) 0  - Ankle / Brachial Index (ABI) - do not check if billed separately 0 X - Vital Signs 1 5 Has the patient been seen at the hospital within the last three years: Yes Total Score: 105 Level Of Care: New/Established - Level 3 Electronic Signature(s) Signed: 03/16/2017 4:45:09 PM By: Alejandro Mulling Entered By: Alejandro Mulling on 03/16/2017 15:20:42 Jesus Morton ([id number]) -------------------------------------------------------------------------------- Encounter Discharge Information Details Patient Name: Jesus Morton Date of Service: 03/16/2017 1:45 PM Medical Record Number: [id number] Patient Account Number: [id number] Date of Birth/Sex: 1955/08/25 (61 y.o. Male) Treating RN: Ashok Cordia, Debi Primary Care Ellery Tash: Everardo Pacific Other Clinician: Referring Abigayl Hor: Everardo Pacific Treating Kayla Weekes/Extender: Rudene Re in  Treatment: 1 Encounter Discharge Information Items Discharge Pain Level: 0 Discharge Condition: Stable Ambulatory Status: Wheelchair Discharge Destination: Nursing Home Transportation: Private Auto Accompanied By: caregivers Schedule Follow-up Appointment: Yes Medication Reconciliation completed No and provided to Patient/Care Yong Wahlquist: Provided on Clinical Summary of Care: 03/16/2017 Form Type Recipient Paper Patient [place] Electronic Signature(s) Signed: 03/17/2017 10:11:21 AM By: Gwenlyn Perking Entered By: Gwenlyn Perking on 03/16/2017 14:31:24 Jesus Morton ([id number]) -------------------------------------------------------------------------------- Lower Extremity Assessment Details Patient Name: Jesus Morton Date of Service: 03/16/2017 1:45 PM Medical Record Number: [id number] Patient Account Number: [id number] Date of Birth/Sex: 1956/04/28 (61 y.o. Male) Treating RN: Phillis Haggis Primary Care Kymora Sciara: Everardo Pacific Other Clinician: Referring Fermina Mishkin: Everardo Pacific Treating Lachina Salsberry/Extender: Rudene Re in Treatment: 1 Electronic Signature(s) Signed: 03/16/2017 4:45:09 PM By: Alejandro Mulling Entered By: Alejandro Mulling on 03/16/2017 14:10:12 LADALE, SHERBURN ([id number]) -------------------------------------------------------------------------------- Multi Wound Chart Details Patient Name: Jesus Morton Date of Service: 03/16/2017 1:45 PM Medical Record Number: [id number] Patient Account Number: [id number] Date of Birth/Sex: 26-Sep-1955 (61 y.o. Male) Treating RN: Ashok Cordia, Debi Primary Care Yamna Mackel: Everardo Pacific Other Clinician: Referring Suvi Archuletta: Everardo Pacific Treating Crews Mccollam/Extender: Rudene Re in Treatment: 1 Vital Signs Height(in): 60 Pulse(bpm): 60 Weight(lbs): 92 Blood Pressure 103/87 (mmHg): Body Mass Index(BMI): 18 Temperature(F): 97.7 Respiratory Rate 16 (breaths/min): Photos: [1:No Photos]  [N/A:N/A] Wound Location: [1:Right Sacrum] [N/A:N/A] Wounding Event: [1:Pressure Injury] [N/A:N/A] Primary Etiology: [1:Pressure Ulcer] [N/A:N/A] Comorbid History: [1:Anemia, Hypotension, Dementia] [N/A:N/A] Date Acquired: [1:02/16/2017] [N/A:N/A] Weeks of Treatment: [1:1] [N/A:N/A] Wound Status: [1:Open] [N/A:N/A] Measurements L x W x D 4.2x2.2x0.1 [N/A:N/A] (cm) Area (cm) : [1:7.257] [N/A:N/A] Volume (cm) : [1:0.726] [N/A:N/A] % Reduction in Area: [1:28.90%] [N/A:N/A] % Reduction in Volume: 28.90% [N/A:N/A] Classification: [1:Category/Stage III] [N/A:N/A] Exudate Amount: [1:Large] [N/A:N/A] Exudate Type: [1:Serous] [N/A:N/A] Exudate Color: [1:amber] [N/A:N/A] Wound Margin: [1:Flat and Intact] [N/A:N/A] Granulation Amount: [1:Small (1-33%)] [N/A:N/A] Granulation Quality: [1:Red, Pink] [N/A:N/A] Necrotic Amount: [1:Large (67-100%)] [  N/A:N/A] Necrotic Tissue: [1:Eschar, Adherent Slough] [N/A:N/A] Epithelialization: [1:None] [N/A:N/A] Periwound Skin Texture: No Abnormalities Noted [N/A:N/A] Periwound Skin [1:Maceration: Yes] [N/A:N/A] Moisture: Periwound Skin Color: Erythema: Yes [N/A:N/A] Erythema Location: Circumferential N/A N/A Temperature: No Abnormality N/A N/A Tenderness on Yes N/A N/A Palpation: Wound Preparation: Ulcer Cleansing: N/A N/A Rinsed/Irrigated with Saline Topical Anesthetic Applied: Other: lidocaine 4% Treatment Notes Electronic Signature(s) Signed: 03/16/2017 4:45:09 PM By: Alejandro Mulling Entered By: Alejandro Mulling on 03/16/2017 14:25:58 CAEDEN, FOOTS ([id number]) -------------------------------------------------------------------------------- Multi-Disciplinary Care Plan Details Patient Name: Jesus Morton Date of Service: 03/16/2017 1:45 PM Medical Record Number: [id number] Patient Account Number: [id number] Date of Birth/Sex: 05/05/1956 (61 y.o. Male) Treating RN: Ashok Cordia, Debi Primary Care Shamirah Ivan: Everardo Pacific Other  Clinician: Referring Travonne Schowalter: Everardo Pacific Treating Dontrey Snellgrove/Extender: Rudene Re in Treatment: 1 Active Inactive ` Abuse / Safety / Falls / Self Care Management Nursing Diagnoses: Potential for falls Goals: Patient will not experience any injury related to falls Date Initiated: 03/09/2017 Target Resolution Date: 06/17/2017 Goal Status: Active Interventions: Assess Activities of Daily Living upon admission and as needed Assess fall risk on admission and as needed Assess: immobility, friction, shearing, incontinence upon admission and as needed Assess impairment of mobility on admission and as needed per policy Notes: ` Nutrition Nursing Diagnoses: Imbalanced nutrition Potential for alteratiion in Nutrition/Potential for imbalanced nutrition Goals: Patient/caregiver agrees to and verbalizes understanding of need to use nutritional supplements and/or vitamins as prescribed Date Initiated: 03/09/2017 Target Resolution Date: 06/17/2017 Goal Status: Active Interventions: Assess patient nutrition upon admission and as needed per policy Provide education on nutrition Treatment Activities: JAYDIAN, SANTANA ([id number]) Education provided on Nutrition : 03/09/2017 Notes: ` Orientation to the Wound Care Program Nursing Diagnoses: Knowledge deficit related to the wound healing center program Goals: Patient/caregiver will verbalize understanding of the Wound Healing Center Program Date Initiated: 03/09/2017 Target Resolution Date: 03/18/2017 Goal Status: Active Interventions: Provide education on orientation to the wound center Notes: ` Pain, Acute or Chronic Nursing Diagnoses: Pain, acute or chronic: actual or potential Potential alteration in comfort, pain Goals: Patient/caregiver will verbalize adequate pain control between visits Date Initiated: 03/09/2017 Target Resolution Date: 06/17/2017 Goal Status: Active Interventions: Complete pain assessment as per  visit requirements Notes: ` Pressure Nursing Diagnoses: Knowledge deficit related to causes and risk factors for pressure ulcer development Knowledge deficit related to management of pressures ulcers Potential for impaired tissue integrity related to pressure, friction, moisture, and shear Goals: Patient will remain free from development of additional pressure ulcers Date Initiated: 03/09/2017 Target Resolution Date: 06/17/2017 CAMBELL, STANEK ([id number]) Goal Status: Active Interventions: Assess: immobility, friction, shearing, incontinence upon admission and as needed Provide education on pressure ulcers Notes: Electronic Signature(s) Signed: 03/16/2017 4:45:09 PM By: Alejandro Mulling Entered By: Alejandro Mulling on 03/16/2017 14:25:49 Jesus Morton ([id number]) -------------------------------------------------------------------------------- Pain Assessment Details Patient Name: Jesus Morton Date of Service: 03/16/2017 1:45 PM Medical Record Number: [id number] Patient Account Number: [id number] Date of Birth/Sex: 1956-01-14 (61 y.o. Male) Treating RN: Ashok Cordia, Debi Primary Care Eduar Kumpf: Everardo Pacific Other Clinician: Referring Jene Huq: Everardo Pacific Treating Elon Eoff/Extender: Rudene Re in Treatment: 1 Active Problems Location of Pain Severity and Description of Pain Patient Has Paino No Site Locations Pain Management and Medication Current Pain Management: Electronic Signature(s) Signed: 03/16/2017 4:45:09 PM By: Alejandro Mulling Entered By: Alejandro Mulling on 03/16/2017 14:01:30 Jesus Morton ([id number]) -------------------------------------------------------------------------------- Patient/Caregiver Education Details Patient Name: MONG, NEAL 03/16/2017 1:45 Date of Service: PM Medical Record [id number] Number: Patient Account Number: [id number] Date of Birth/Gender: 1955/08/18 (61 y.o. Male) Treating RN: Ashok Cordia,  Debi MCLEAN-SCOCOZZA, Other Clinician: Primary Care Physician: TRACY Treating Britto, Verdia Kuba, Physician/Extender: Referring Physician: Delmer Islam in Treatment: 1 Education Assessment Education Provided To: Patient Education Topics Provided Wound/Skin Impairment: Handouts: Other: change dressing as ordered Methods: Demonstration, Explain/Verbal Responses: State content correctly Electronic Signature(s) Signed: 03/16/2017 4:45:09 PM By: Alejandro Mulling Entered By: Alejandro Mulling on 03/16/2017 14:29:34 Jesus Morton ([id number]) -------------------------------------------------------------------------------- Wound Assessment Details Patient Name: Jesus Morton Date of Service: 03/16/2017 1:45 PM Medical Record Number: [id number] Patient Account Number: [id number] Date of Birth/Sex: July 11, 1956 (61 y.o. Male) Treating RN: Ashok Cordia, Debi Primary Care Myrla Malanowski: Everardo Pacific Other Clinician: Referring Amie Cowens: Everardo Pacific Treating Atarah Cadogan/Extender: Rudene Re in Treatment: 1 Wound Status Wound Number: 1 Primary Etiology: Pressure Ulcer Wound Location: Right Sacrum Wound Status: Open Wounding Event: Pressure Injury Comorbid History: Anemia, Hypotension, Dementia Date Acquired: 02/16/2017 Weeks Of Treatment: 1 Clustered Wound: No Wound Measurements Length: (cm) 4.2 Width: (cm) 2.2 Depth: (cm) 0.1 Area: (cm) 7.257 Volume: (cm) 0.726 % Reduction in Area: 28.9% % Reduction in Volume: 28.9% Epithelialization: None Tunneling: No Undermining: No Wound Description Classification: Category/Stage III Wound Margin: Flat and Intact Exudate Amount: Large Exudate Type: Serous Exudate Color: amber Foul Odor After Cleansing: No Slough/Fibrino Yes Wound Bed Granulation Amount: Small (1-33%) Granulation Quality: Red, Pink Necrotic Amount: Large (67-100%) Necrotic Quality: Eschar, Adherent Slough Periwound Skin Texture Texture  Color No Abnormalities Noted: No No Abnormalities Noted: No Erythema: Yes Moisture Erythema Location: Circumferential No Abnormalities Noted: No Maceration: Yes Temperature / Pain Temperature: No Abnormality Tenderness on Palpation: Yes Wound Preparation Ulcer Cleansing: Rinsed/Irrigated with Saline Salman, Culver ([id number]) Topical Anesthetic Applied: Other: lidocaine 4%, Treatment Notes Wound #1 (Right Sacrum) 1. Cleansed with: Clean wound with Normal Saline 2. Anesthetic Topical Lidocaine 4% cream to wound bed prior to debridement 3. Peri-wound Care: Skin Prep 4. Dressing Applied: Santyl Ointment 5. Secondary Dressing Applied Bordered Foam Dressing Dry Gauze Electronic Signature(s) Signed: 03/16/2017 4:45:09 PM By: Alejandro Mulling Entered By: Alejandro Mulling on 03/16/2017 14:09:44 UPTON, RUSSEY ([id number]) -------------------------------------------------------------------------------- Vitals Details Patient Name: Jesus Morton Date of Service: 03/16/2017 1:45 PM Medical Record Number: [id number] Patient Account Number: [id number] Date of Birth/Sex: 1955/08/23 (61 y.o. Male) Treating RN: Ashok Cordia, Debi Primary Care Nizar Cutler: Everardo Pacific Other Clinician: Referring Mariadejesus Cade: Everardo Pacific Treating Maison Agrusa/Extender: Rudene Re in Treatment: 1 Vital Signs Time Taken: 14:01 Temperature (F): 97.7 Height (in): 60 Pulse (bpm): 60 Weight (lbs): 92 Respiratory Rate (breaths/min): 16 Body Mass Index (BMI): 18 Blood Pressure (mmHg): 103/87 Reference Range: 80 - 120 mg / dl Electronic Signature(s) Signed: 03/16/2017 4:45:09 PM By: Alejandro Mulling Entered By: Alejandro Mulling on 03/16/2017 14:01:48

## 2017-03-18 NOTE — Progress Notes (Signed)
Jesus, Morton ([id number]) Visit Report for 03/16/2017 Chief Complaint Document Details Patient Name: Jesus Morton, Jesus Morton 03/16/2017 1:45 Date of Service: PM Medical Record [id number] Number: Patient Account Number: [id number] Date of Birth/Sex: [birth year] (61 y.o. Male) Treating RN: Jesus Morton, Other Clinician: Primary Care Provider: French Morton Treating Jesus Morton, Jesus Morton, Provider/Extender: Referring Provider: Delmer Morton in Treatment: 1 Information Obtained from: Patient Chief Complaint Patient is at the clinic for treatment of an open pressure ulcer to the right medial gluteal and sacral area which she's had for about 3 weeks Electronic Signature(s) Signed: 03/16/2017 4:09:31 PM By: Jesus Kanner MD, FACS Entered By: Jesus Morton on 03/16/2017 14:25:05 Jesus Morton ([id number]) -------------------------------------------------------------------------------- HPI Details Patient Name: Jesus Morton, Jesus Morton 03/16/2017 1:45 Date of Service: PM Medical Record [id number] Number: Patient Account Number: [id number] Date of Birth/Sex: [birth year] (61 y.o. Male) Treating RN: Jesus Morton, Other Clinician: Primary Care Provider: French Morton Treating Jesus Morton, Jesus Morton, Provider/Extender: Referring Provider: Delmer Morton in Treatment: 1 History of Present Illness Location: right gluteal and sacral area Quality: Patient reports experiencing a dull pain to affected area(s). Severity: Patient states wound are getting worse. Duration: Patient has had the wound for < 3 weeks prior to presenting for treatment Timing: Pain in wound is Intermittent (comes and goes Context: The wound would happen gradually Modifying Factors: Other treatment(s) tried include:oral antibiotics and local care Associated Signs and Symptoms: Patient reports presence of swelling HPI Description: 61 year old male with Down syndrome and dementia has been referred to [place] for a  sacral decubitus ulcer. Past medical history significant for GERD, dementia, Down syndrome, status post hiatal hernia repair in June 2015, repair of incarcerated direct inguinal hernia in August 2015, colonoscopy and EGD in the past. On a recent examination his PCP Dr. Richardson Morton had found a stage III pressure ulcer of the sacrum and referred him to the wound clinic. Electronic Signature(s) Signed: 03/16/2017 4:09:31 PM By: Jesus Kanner MD, FACS Entered By: Jesus Morton on 03/16/2017 14:25:15 Jesus, Morton ([id number]) -------------------------------------------------------------------------------- Physical Exam Details Patient Name: Jesus, Morton 03/16/2017 1:45 Date of Service: PM Medical Record [id number] Number: Patient Account Number: [id number] Date of Birth/Sex: [birth year] (61 y.o. Male) Treating RN: Jesus Morton, Other Clinician: Primary Care Provider: French Morton Treating Jesus Morton, Jesus Morton, Provider/Extender: Referring Provider: Delmer Morton in Treatment: 1 Constitutional . Pulse regular. Respirations normal and unlabored. Afebrile. . Eyes Nonicteric. Reactive to light. Ears, Nose, Mouth, and Throat Lips, teeth, and gums WNL.[name] Moist mucosa without lesions. Neck supple and nontender. No palpable supraclavicular or cervical adenopathy. Normal sized without goiter. Respiratory WNL. No retractions.. Cardiovascular Pedal Pulses WNL. No clubbing, cyanosis or edema. Lymphatic No adneopathy. No adenopathy. No adenopathy. Musculoskeletal Adexa without tenderness or enlargement.. Digits and nails w/o clubbing, cyanosis, infection, petechiae, ischemia, or inflammatory conditions.. Integumentary (Hair, Skin) No suspicious lesions. No crepitus or fluctuance. No peri-wound warmth or erythema. No masses.[name] Psychiatric Judgement and insight Intact.. No evidence of depression, anxiety, or agitation.. Notes the stage III ulcer was covered with a thick eschar  today which was sharply crosshatched with a #15 blade and we will use Santyl ointment over this. Electronic Signature(s) Signed: 03/16/2017 4:09:31 PM By: Jesus Kanner MD, FACS Entered By: Jesus Morton on 03/16/2017 14:26:04 Jesus Morton, Jesus Morton ([id number]) -------------------------------------------------------------------------------- Physician Orders Details Patient Name: Jesus Morton, Jesus Morton 03/16/2017 1:45 Date of Service: PM Medical Record [id number] Number: Patient Account Number: [id number] Date of Birth/Sex: Jesus Morton (61 y.o. Male) Treating RN: Jesus Morton, Other Clinician: Primary Care Provider: French Morton Treating Lasonia Casino, Jesus Morton, Provider/Extender: Referring Provider: Delmer Morton in  Treatment: 1 Verbal / Phone Orders: No Diagnosis Coding Wound Cleansing Wound #1 Right Sacrum o Clean wound with Normal Saline. o Cleanse wound with mild soap and water o May Shower, gently pat wound dry prior to applying new dressing. - Pt may shower every other day before dressing changes unless soiled. Anesthetic Wound #1 Right Sacrum o Topical Lidocaine 4% cream applied to wound bed prior to debridement - for clinic use Skin Barriers/Peri-Wound Care Wound #1 Right Sacrum o Skin Prep Primary Wound Dressing Wound #1 Right Sacrum o Santyl Ointment Secondary Dressing Wound #1 Right Sacrum o Dry Gauze o Boardered Foam Dressing Dressing Change Frequency Wound #1 Right Sacrum o Change dressing every other day. o Other: - change PRN as soiled Follow-up Appointments Wound #1 Right Sacrum o Return Appointment in 1 week. Jesus, Morton ([id number]) Off-Loading Wound #1 Right Sacrum o Turn and reposition every 2 hours Additional Orders / Instructions Wound #1 Right Sacrum o Increase protein intake. o Activity as tolerated Medications-please add to medication list. Wound #1 Right Sacrum o Other: - Take Vitamin A, Vitamin C,  Zinc, Multivitamin Patient Medications Allergies: NKDA Notifications Medication Indication Start End Santyl 03/16/2017 DOSE topical 250 unit/gram ointment - ointment topical as directed daily Electronic Signature(s) Signed: 03/16/2017 2:27:22 PM By: Jesus Kanner MD, FACS Entered By: Jesus Morton on 03/16/2017 14:27:21 CHESKY, HEYER ([id number]) -------------------------------------------------------------------------------- Problem List Details Patient Name: MOHAB, ASHBY 03/16/2017 1:45 Date of Service: PM Medical Record [id number] Number: Patient Account Number: [id number] Date of Birth/Sex: [birth year] Morton (61 y.o. Male) Treating RN: Jesus Morton, Other Clinician: Primary Care Provider: French Morton Treating Geriann Lafont, Jesus Morton, Provider/Extender: Referring Provider: Delmer Morton in Treatment: 1 Active Problems ICD-10 Encounter Code Description Active Date Diagnosis Q90.9 Down syndrome, unspecified 03/09/2017 Yes L89.313 Pressure ulcer of right buttock, stage 3 03/09/2017 Yes L89.153 Pressure ulcer of sacral region, stage 3 03/09/2017 Yes E44.1 Mild protein-calorie malnutrition 03/09/2017 Yes Inactive Problems Resolved Problems Electronic Signature(s) Signed: 03/16/2017 4:09:31 PM By: Jesus Kanner MD, FACS Entered By: Jesus Morton on 03/16/2017 14:24:51 Jesus Morton ([id number]) -------------------------------------------------------------------------------- Progress Note Details Patient Name: Jesus Morton, Jesus Morton 03/16/2017 1:45 Date of Service: PM Medical Record [id number] Number: Patient Account Number: [id number] Date of Birth/Sex: [birth year] (61 y.o. Male) Treating RN: Jesus Morton, Other Clinician: Primary Care Provider: French Morton Treating Jamila Slatten, Jesus Morton, Provider/Extender: Referring Provider: Delmer Morton in Treatment: 1 Subjective Chief Complaint Information obtained from Patient Patient is at the clinic for  treatment of an open pressure ulcer to the right medial gluteal and sacral area which she's had for about 3 weeks History of Present Illness (HPI) The following HPI elements were documented for the patient's wound: Location: right gluteal and sacral area Quality: Patient reports experiencing a dull pain to affected area(s). Severity: Patient states wound are getting worse. Duration: Patient has had the wound for < 3 weeks prior to presenting for treatment Timing: Pain in wound is Intermittent (comes and goes Context: The wound would happen gradually Modifying Factors: Other treatment(s) tried include:oral antibiotics and local care Associated Signs and Symptoms: Patient reports presence of swelling 61 year old male with Down syndrome and dementia has been referred to [place] for a sacral decubitus ulcer. Past medical history significant for GERD, dementia, Down syndrome, status post hiatal hernia repair in June 2015, repair of incarcerated direct inguinal hernia in August 2015, colonoscopy and EGD in the past. On a recent examination his PCP Dr. Richardson Morton had found a stage III pressure ulcer of the sacrum and referred him to the wound clinic. Objective  Constitutional Pulse regular. Respirations normal and unlabored. Afebrile. North BendNANCE, Jesus ([id number]) Vitals Time Taken: 2:01 PM, Height: 60 in, Weight: 92 lbs, BMI: 18, Temperature: 97.7 F, Pulse: 60 bpm, Respiratory Rate: 16 breaths/min, Blood Pressure: 103/87 mmHg. Eyes Nonicteric. Reactive to light. Ears, Nose, Mouth, and Throat Lips, teeth, and gums WNL.[name]. Moist mucosa without lesions. Neck supple and nontender. No palpable supraclavicular or cervical adenopathy. Normal sized without goiter. Respiratory WNL. No retractions.. Cardiovascular Pedal Pulses WNL. No clubbing, cyanosis or edema. Lymphatic No adneopathy. No adenopathy. No adenopathy. Musculoskeletal Adexa without tenderness or enlargement.. Digits and nails w/o clubbing,  cyanosis, infection, petechiae, ischemia, or inflammatory conditions.[name]. Psychiatric Judgement and insight Intact.. No evidence of depression, anxiety, or agitation.. General Notes: the stage III ulcer was covered with a thick eschar today which was sharply crosshatched with a #15 blade and we will use Santyl ointment over this. Integumentary (Hair, Skin) No suspicious lesions. No crepitus or fluctuance. No peri-wound warmth or erythema. No masses.. Wound #1 status is Open. Original cause of wound was Pressure Injury. The wound is located on the Right Sacrum. The wound measures 4.2cm length x 2.2cm width x 0.1cm depth; 7.257cm^2 area and 0.726cm^3 volume. There is no tunneling or undermining noted. There is a large amount of serous drainage noted. The wound margin is flat and intact. There is small (1-33%) red, pink granulation within the wound bed. There is a large (67-100%) amount of necrotic tissue within the wound bed including Eschar and Adherent Slough. The periwound skin appearance exhibited: Maceration, Erythema. The surrounding wound skin color is noted with erythema which is circumferential. Periwound temperature was noted as No Abnormality. The periwound has tenderness on palpation. Assessment Cogbill, Corvin ([id number]) Active Problems ICD-10 Q90.9 - Down syndrome, unspecified L89.313 - Pressure ulcer of right buttock, stage 3 L89.153 - Pressure ulcer of sacral region, stage 3 E44.1 - Mild protein-calorie malnutrition Plan Wound Cleansing: Wound #1 Right Sacrum: Clean wound with Normal Saline. Cleanse wound with mild soap and water May Shower, gently pat wound dry prior to applying new dressing. - Pt may shower every other day before dressing changes unless soiled. Anesthetic: Wound #1 Right Sacrum: Topical Lidocaine 4% cream applied to wound bed prior to debridement - for clinic use Skin Barriers/Peri-Wound Care: Wound #1 Right Sacrum: Skin Prep Primary Wound  Dressing: Wound #1 Right Sacrum: Santyl Ointment Secondary Dressing: Wound #1 Right Sacrum: Dry Gauze Boardered Foam Dressing Dressing Change Frequency: Wound #1 Right Sacrum: Change dressing every other day. Other: - change PRN as soiled Follow-up Appointments: Wound #1 Right Sacrum: Return Appointment in 1 week. Off-Loading: Wound #1 Right Sacrum: Turn and reposition every 2 hours Additional Orders / Instructions: Wound #1 Right Sacrum: Increase protein intake. Activity as tolerated Medications-please add to medication list.: Wound #1 Right SacrumReather Morton: Morton, Jesus ([id number]) Other: - Take Vitamin A, Vitamin C, Zinc, Multivitamin The following medication(s) was prescribed: Santyl topical 250 unit/gram ointment ointment topical as directed daily starting 03/16/2017 After review and sharp debridement today I have recommended: 1. Santyl ointment and a bordered foam, to be changed daily. 2. Offloading has been discussed in great detail 3. Ambulation as much as possible and appropriate posture changes during the day 4. Adequate protein, vitamin A, vitamin C and zinc 5. Regular visits wound center Electronic Signature(s) Signed: 03/16/2017 4:09:31 PM By: Jesus KannerBritto, Jillianna Stanek MD, FACS Entered By: Jesus KannerBritto, Mosie Angus on 03/16/2017 14:28:38 Jesus LittlerANCE, Jesus ([id number]) -------------------------------------------------------------------------------- SuperBill Details Patient Name: Jesus LittlerNANCE, Jesus Date of Service: 03/16/2017 Medical Record Number: [id number] Patient Account  Number: [id number] Date of Birth/Sex: [birth year] (61 y.o. Male) Treating RN: Ashok Cordia, Debi Primary Care Provider: Everardo Pacific Other Clinician: Referring Provider: Everardo Pacific Treating Provider/Extender: Rudene Re in Treatment: 1 Diagnosis Coding ICD-10 Codes Code Description Q90.9 Down syndrome, unspecified L89.313 Pressure ulcer of right buttock, stage 3 L89.153 Pressure ulcer of sacral  region, stage 3 E44.1 Mild protein-calorie malnutrition Facility Procedures CPT4 Code: [id number] Description: 99213 - WOUND CARE VISIT-LEV 3 EST PT Modifier: Quantity: 1 Physician Procedures CPT4 Code: [id number] Description: 99213 - WC PHYS LEVEL 3 - EST PT ICD-10 Description Diagnosis Q90.9 Down syndrome, unspecified L89.313 Pressure ulcer of right buttock, stage 3 L89.153 Pressure ulcer of sacral region, stage 3 E44.1 Mild protein-calorie malnutrition Modifier: Quantity: 1 Electronic Signature(s) Signed: 03/16/2017 4:09:31 PM By: Jesus Kanner MD, FACS Signed: 03/16/2017 4:45:09 PM By: Alejandro Mulling Entered By: Alejandro Mulling on 03/16/2017 15:20:48

## 2017-03-23 ENCOUNTER — Encounter: Payer: Medicare Other | Admitting: Surgery

## 2017-03-23 DIAGNOSIS — L89313 Pressure ulcer of right buttock, stage 3: Secondary | ICD-10-CM | POA: Diagnosis not present

## 2017-03-24 ENCOUNTER — Ambulatory Visit: Payer: Self-pay | Admitting: Surgery

## 2017-03-25 NOTE — Progress Notes (Signed)
Jesus Morton, Jesus Morton ([id number]) Visit Report for 03/23/2017 Chief Complaint Document Details Patient Name: Jesus Morton, Jesus Morton 03/23/2017 8:15 Date of Service: AM Medical Record [id number] Number: Patient Account Number: [id number] Date of Birth/Sex: 1955/10/25 (62 y.o. Male) Treating RN: Lurlean Leyden, Other Clinician: Primary Care Provider: French Ana Treating Kemiyah Tarazon, Verdia Kuba, Provider/Extender: Referring Provider: Delmer Islam in Treatment: 2 Information Obtained from: Patient Chief Complaint Patient is at the clinic for treatment of an open pressure ulcer to the right medial gluteal and sacral area which she's had for about 3 weeks Electronic Signature(s) Signed: 03/23/2017 5:02:50 PM By: Evlyn Kanner MD, FACS Entered By: Evlyn Kanner on 03/23/2017 09:23:28 [name] ([id number]) -------------------------------------------------------------------------------- Debridement Details Patient Name: Jesus Morton, Jesus Morton 03/23/2017 8:15 Date of Service: AM Medical Record [id number] Number: Patient Account Number: [id number] Date of Birth/Sex: [birth year] (61 y.o. Male) Treating RN: Lurlean Leyden, Other Clinician: Primary Care Provider: French Ana Treating Kayslee Furey, Verdia Kuba, Provider/Extender: Referring Provider: Delmer Islam in Treatment: 2 Debridement Performed for Wound #1 Right Sacrum Assessment: Performed By: Physician Evlyn Kanner, MD Debridement: Chemical/enzymatic Debridement Non-Selective Description: Pre-procedure Verification/Time Out Yes - 08:50 Taken: Start Time: 08:51 End Time: 08:52 Procedural Pain: 0 Post Procedural Pain: 0 Response to Treatment: Procedure was tolerated well Post Debridement Measurements of Total Wound Length: (cm) 3.5 Stage: Category/Stage III Width: (cm) 3.3 Depth: (cm) 0.1 Volume: (cm) 0.907 Character of Wound/Ulcer Post Stable Debridement: Post Procedure Diagnosis Same as  Pre-procedure Electronic Signature(s) Signed: 03/23/2017 5:02:50 PM By: Evlyn Kanner MD, FACS Signed: 03/23/2017 5:55:00 PM By: Jesus Morton Gurney, BSN, RN, CWS, Kim RN, BSN Entered By: Jesus Morton Gurney, BSN, RN, CWS, Kim on 03/23/2017 08:58:45 Jesus Morton, Jesus Morton ([id number]) -------------------------------------------------------------------------------- HPI Details Patient Name: Jesus Morton, Jesus Morton 03/23/2017 8:15 Date of Service: AM Medical Record [id number] Number: Patient Account Number: [id number] Date of Birth/Sex: January 31, 1956 (61 y.o. Male) Treating RN: Lurlean Leyden, Other Clinician: Primary Care Provider: French Ana Treating Jakelyn Squyres, Verdia Kuba, Provider/Extender: Referring Provider: Delmer Islam in Treatment: 2 History of Present Illness Location: right gluteal and sacral area Quality: Patient reports experiencing a dull pain to affected area(s). Severity: Patient states wound are getting worse. Duration: Patient has had the wound for < 3 weeks prior to presenting for treatment Timing: Pain in wound is Intermittent (comes and goes Context: The wound would happen gradually Modifying Factors: Other treatment(s) tried include:oral antibiotics and local care Associated Signs and Symptoms: Patient reports presence of swelling HPI Description: 61 year old male with Down syndrome and dementia has been referred to [place] for a sacral decubitus ulcer. Past medical history significant for GERD, dementia, Down syndrome, status post hiatal hernia repair in June 2015, repair of incarcerated direct inguinal hernia in August 2015, colonoscopy and EGD in the past. On a recent examination his PCP Dr. Richardson Chiquito had found a stage III pressure ulcer of the sacrum and referred him to the wound clinic. 03/23/2017 -- they're trying their best to offload him but he continues to have problems doing this and has developed a deep tissue injury to the right of his original wound between the 6 and 12:00 position for  about 2 cm from the wound edge Electronic Signature(s) Signed: 03/23/2017 5:02:50 PM By: Evlyn Kanner MD, FACS Entered By: Evlyn Kanner on 03/23/2017 09:24:11 MAMADOU, BREON ([id number]) -------------------------------------------------------------------------------- Physical Exam Details Patient Name: Jesus Morton, Jesus Morton 03/23/2017 8:15 Date of Service: AM Medical Record [id number] Number: Patient Account Number: [id number] Date of Birth/Sex: August 31, 1955 (61 y.o. Male) Treating RN: Lurlean Leyden, Other Clinician: Primary Care Provider: French Ana Treating Kaitelyn Jamison, Verdia Kuba, Provider/Extender: Referring Provider: Delmer Islam in  Treatment: 2 Constitutional . Pulse regular. Respirations normal and unlabored. Afebrile. . Eyes Nonicteric. Reactive to light. Ears, Nose, Mouth, and Throat Lips, teeth, and gums WNL.[name] Moist mucosa without lesions. Neck supple and nontender. No palpable supraclavicular or cervical adenopathy. Normal sized without goiter. Respiratory WNL. No retractions.. Cardiovascular Pedal Pulses WNL. No clubbing, cyanosis or edema. Lymphatic No adneopathy. No adenopathy. No adenopathy. Musculoskeletal Adexa without tenderness or enlargement.. Digits and nails w/o clubbing, cyanosis, infection, petechiae, ischemia, or inflammatory conditions.. Integumentary (Hair, Skin) No suspicious lesions. No crepitus or fluctuance. No peri-wound warmth or erythema. No masses.[name] Psychiatric Judgement and insight Intact.. No evidence of depression, anxiety, or agitation.. Notes the area where we applying Santyl is very tender today and no sharp debridement was attempted. There is a deep tissue injury to the right of his original wound between the 6 and 12:00 position for about 2 cm from the wound edge Electronic Signature(s) Signed: 03/23/2017 5:02:50 PM By: Evlyn Kanner MD, FACS Entered By: Evlyn Kanner on 03/23/2017 09:24:52 Jesus Morton, Jesus Morton  ([id number]) -------------------------------------------------------------------------------- Physician Orders Details Patient Name: Jesus Morton, Jesus Morton 03/23/2017 8:15 Date of Service: AM Medical Record [id number] Number: Patient Account Number: [id number] Date of Birth/Sex: 1955/09/20 (61 y.o. Male) Treating RN: Lurlean Leyden, Other Clinician: Primary Care Provider: French Ana Treating Tamisha Nordstrom, Verdia Kuba, Provider/Extender: Referring Provider: Delmer Islam in Treatment: 2 Verbal / Phone Orders: No Diagnosis Coding Wound Cleansing Wound #1 Right Sacrum o Clean wound with Normal Saline. o Cleanse wound with mild soap and water o May Shower, gently pat wound dry prior to applying new dressing. - Pt may shower every other day before dressing changes unless soiled. Anesthetic Wound #1 Right Sacrum o Topical Lidocaine 4% cream applied to wound bed prior to debridement - for clinic use Skin Barriers/Peri-Wound Care Wound #1 Right Sacrum o Skin Prep Primary Wound Dressing Wound #1 Right Sacrum o Santyl Ointment Secondary Dressing Wound #1 Right Sacrum o Dry Gauze o Boardered Foam Dressing Dressing Change Frequency Wound #1 Right Sacrum o Change dressing every day. o Other: - change PRN as soiled Follow-up Appointments Wound #1 Right Sacrum o Return Appointment in 1 week. [place], Shay ([id number]) Off-Loading Wound #1 Right Sacrum o Turn and reposition every 2 hours Additional Orders / Instructions Wound #1 Right Sacrum o Increase protein intake. o Activity as tolerated Medications-please add to medication list. Wound #1 Right Sacrum o Other: - Take Vitamin A, Vitamin C, Zinc, Multivitamin Electronic Signature(s) Signed: 03/23/2017 5:02:50 PM By: Evlyn Kanner MD, FACS Signed: 03/23/2017 5:55:00 PM By: Jesus Morton Gurney, BSN, RN, CWS, Kim RN, BSN Entered By: Jesus Morton Gurney, BSN, RN, CWS, Kim on 03/23/2017 08:59:24 Jesus Morton, Jesus Morton  ([id number]) -------------------------------------------------------------------------------- Problem List Details Patient Name: Jesus Morton, Jesus Morton 03/23/2017 8:15 Date of Service: AM Medical Record [id number] Number: Patient Account Number: [id number] Date of Birth/Sex: 1955/10/12 (61 y.o. Male) Treating RN: Lurlean Leyden, Other Clinician: Primary Care Provider: French Ana Treating Mychal Durio, Verdia Kuba, Provider/Extender: Referring Provider: Delmer Islam in Treatment: 2 Active Problems ICD-10 Encounter Code Description Active Date Diagnosis Q90.9 Down syndrome, unspecified 03/09/2017 Yes L89.313 Pressure ulcer of right buttock, stage 3 03/09/2017 Yes L89.153 Pressure ulcer of sacral region, stage 3 03/09/2017 Yes E44.1 Mild protein-calorie malnutrition 03/09/2017 Yes Inactive Problems Resolved Problems Electronic Signature(s) Signed: 03/23/2017 5:02:50 PM By: Evlyn Kanner MD, FACS Entered By: Evlyn Kanner on 03/23/2017 09:23:17 [name] ([id number]) -------------------------------------------------------------------------------- Progress Note Details Patient Name: Jesus Morton, Jesus Morton 03/23/2017 8:15 Date of Service: AM Medical Record [id number] Number: Patient Account Number: [id number] Date of Birth/Sex: 1955-08-10 (61 y.o. Male)  Treating RN: Lurlean Leyden, Other Clinician: Primary Care Provider: French Ana Treating Jakeia Carreras, Verdia Kuba, Provider/Extender: Referring Provider: Delmer Islam in Treatment: 2 Subjective Chief Complaint Information obtained from Patient Patient is at the clinic for treatment of an open pressure ulcer to the right medial gluteal and sacral area which she's had for about 3 weeks History of Present Illness (HPI) The following HPI elements were documented for the patient's wound: Location: right gluteal and sacral area Quality: Patient reports experiencing a dull pain to affected area(s). Severity: Patient  states wound are getting worse. Duration: Patient has had the wound for < 3 weeks prior to presenting for treatment Timing: Pain in wound is Intermittent (comes and goes Context: The wound would happen gradually Modifying Factors: Other treatment(s) tried include:oral antibiotics and local care Associated Signs and Symptoms: Patient reports presence of swelling 61 year old male with Down syndrome and dementia has been referred to [place] for a sacral decubitus ulcer. Past medical history significant for GERD, dementia, Down syndrome, status post hiatal hernia repair in June 2015, repair of incarcerated direct inguinal hernia in August 2015, colonoscopy and EGD in the past. On a recent examination his PCP Dr. Richardson Chiquito had found a stage III pressure ulcer of the sacrum and referred him to the wound clinic. 03/23/2017 -- they're trying their best to offload him but he continues to have problems doing this and has developed a deep tissue injury to the right of his original wound between the 6 and 12:00 position for about 2 cm from the wound edge Objective Jesus Morton, Jesus Morton ([id number]) Constitutional Pulse regular. Respirations normal and unlabored. Afebrile. Vitals Time Taken: 8:42 AM, Height: 60 in, Weight: 92 lbs, BMI: 18, Temperature: 98.2 F, Pulse: 87 bpm, Respiratory Rate: 16 breaths/min, Blood Pressure: 106/42 mmHg. Eyes Nonicteric. Reactive to light. Ears, Nose, Mouth, and Throat Lips, teeth, and gums WNL.[name] Moist mucosa without lesions. Neck supple and nontender. No palpable supraclavicular or cervical adenopathy. Normal sized without goiter. Respiratory WNL. No retractions.. Cardiovascular Pedal Pulses WNL. No clubbing, cyanosis or edema. Lymphatic No adneopathy. No adenopathy. No adenopathy. Musculoskeletal Adexa without tenderness or enlargement.. Digits and nails w/o clubbing, cyanosis, infection, petechiae, ischemia, or inflammatory conditions.[name] Psychiatric Judgement and insight  Intact.. No evidence of depression, anxiety, or agitation.. General Notes: the area where we applying Santyl is very tender today and no sharp debridement was attempted. There is a deep tissue injury to the right of his original wound between the 6 and 12:00 position for about 2 cm from the wound edge Integumentary (Hair, Skin) No suspicious lesions. No crepitus or fluctuance. No peri-wound warmth or erythema. No masses.. Wound #1 status is Open. Original cause of wound was Pressure Injury. The wound is located on the Right Sacrum. The wound measures 3.5cm length x 3cm width x 0.1cm depth; 8.247cm^2 area and 0.825cm^3 volume. There is Fat Layer (Subcutaneous Tissue) Exposed exposed. There is no tunneling or undermining noted. There is a large amount of serous drainage noted. The wound margin is flat and intact. There is small (1-33%) red, pink granulation within the wound bed. There is a large (67-100%) amount of necrotic tissue within the wound bed including Eschar and Adherent Slough. The periwound skin appearance exhibited: Maceration, Ecchymosis, Erythema. The surrounding wound skin color is noted with erythema which is circumferential. Periwound temperature was noted as No Abnormality. The periwound has tenderness on palpation. [place], Pharoah ([id number]) Assessment Active Problems ICD-10 Q90.9 - Down syndrome, unspecified L89.313 - Pressure ulcer of right buttock, stage 3 L89.153 -  Pressure ulcer of sacral region, stage 3 E44.1 - Mild protein-calorie malnutrition Procedures Wound #1 Pre-procedure diagnosis of Wound #1 is a Pressure Ulcer located on the Right Sacrum . There was a Non- Selective Chemical/enzymatic debridement (non-viable tissue was removed) performed by Evlyn Kanner, MD.. A time out was conducted at 08:50, prior to the start of the procedure. The procedure was tolerated well with a pain level of 0 throughout and a pain level of 0 following the procedure. Post  Debridement Measurements: 3.5cm length x 3.3cm width x 0.1cm depth; 0.907cm^3 volume. Post debridement Stage noted as Category/Stage III. Character of Wound/Ulcer Post Debridement is stable. Post procedure Diagnosis Wound #1: Same as Pre-Procedure Plan Wound Cleansing: Wound #1 Right Sacrum: Clean wound with Normal Saline. Cleanse wound with mild soap and water May Shower, gently pat wound dry prior to applying new dressing. - Pt may shower every other day before dressing changes unless soiled. Anesthetic: Wound #1 Right Sacrum: Topical Lidocaine 4% cream applied to wound bed prior to debridement - for clinic use Skin Barriers/Peri-Wound Care: Wound #1 Right Sacrum: Skin Prep Primary Wound Dressing: JYE, FARISS ([id number]) Wound #1 Right Sacrum: Santyl Ointment Secondary Dressing: Wound #1 Right Sacrum: Dry Gauze Boardered Foam Dressing Dressing Change Frequency: Wound #1 Right Sacrum: Change dressing every day. Other: - change PRN as soiled Follow-up Appointments: Wound #1 Right Sacrum: Return Appointment in 1 week. Off-Loading: Wound #1 Right Sacrum: Turn and reposition every 2 hours Additional Orders / Instructions: Wound #1 Right Sacrum: Increase protein intake. Activity as tolerated Medications-please add to medication list.: Wound #1 Right Sacrum: Other: - Take Vitamin A, Vitamin C, Zinc, Multivitamin After review today I have recommended: 1. Santyl ointment and a bordered foam, to be changed daily. 2. Offloading has been discussed in great detail -- specific instructions have been given 3. Ambulation as much as possible and appropriate posture changes during the day 4. Adequate protein, vitamin A, vitamin C and zinc 5. Regular visits wound center Electronic Signature(s) Signed: 03/23/2017 5:02:50 PM By: Evlyn Kanner MD, FACS Entered By: Evlyn Kanner on 03/23/2017 09:25:40 OMERE, MARTI  ([id number]) -------------------------------------------------------------------------------- SuperBill Details Patient Name: [name] Date of Service: 03/23/2017 Medical Record Number: [id number] Patient Account Number: [id number] Date of Birth/Sex: 1956-05-29 (61 y.o. Male) Treating RN: Huel Coventry Primary Care Provider: Everardo Pacific Other Clinician: Referring Provider: Everardo Pacific Treating Provider/Extender: Rudene Re in Treatment: 2 Diagnosis Coding ICD-10 Codes Code Description Q90.9 Down syndrome, unspecified L89.313 Pressure ulcer of right buttock, stage 3 L89.153 Pressure ulcer of sacral region, stage 3 E44.1 Mild protein-calorie malnutrition Facility Procedures CPT4 Code: [id number] Description: [phone] - DEBRIDE W/O ANES NON SELECT Modifier: Quantity: 1 Physician Procedures CPT4 Code: [id number] Description: 99213 - WC PHYS LEVEL 3 - EST PT ICD-10 Description Diagnosis Q90.9 Down syndrome, unspecified L89.153 Pressure ulcer of sacral region, stage 3 L89.313 Pressure ulcer of right buttock, stage 3 E44.1 Mild protein-calorie malnutrition Modifier: Quantity: 1 Electronic Signature(s) Signed: 03/23/2017 5:02:50 PM By: Evlyn Kanner MD, FACS Entered By: Evlyn Kanner on 03/23/2017 09:26:02

## 2017-03-27 ENCOUNTER — Emergency Department
Admission: EM | Admit: 2017-03-27 | Discharge: 2017-03-27 | Disposition: A | Discharge status: Home / Self Care | Payer: Medicare Other | Attending: Emergency Medicine | Admitting: Emergency Medicine

## 2017-03-27 ENCOUNTER — Encounter: Payer: Self-pay | Admitting: Emergency Medicine

## 2017-03-27 DIAGNOSIS — L089 Local infection of the skin and subcutaneous tissue, unspecified: Secondary | ICD-10-CM | POA: Diagnosis not present

## 2017-03-27 DIAGNOSIS — Z79899 Other long term (current) drug therapy: Secondary | ICD-10-CM | POA: Diagnosis not present

## 2017-03-27 DIAGNOSIS — Q909 Down syndrome, unspecified: Secondary | ICD-10-CM | POA: Diagnosis not present

## 2017-03-27 DIAGNOSIS — R21 Rash and other nonspecific skin eruption: Secondary | ICD-10-CM

## 2017-03-27 DIAGNOSIS — T148XXA Other injury of unspecified body region, initial encounter: Secondary | ICD-10-CM

## 2017-03-27 DIAGNOSIS — M79641 Pain in right hand: Secondary | ICD-10-CM | POA: Diagnosis present

## 2017-03-27 LAB — CBC WITH DIFFERENTIAL/PLATELET
BASOS ABS: 0.1 10*3/uL (ref 0–0.1)
Basophils Relative: 1 %
Eosinophils Absolute: 0.1 10*3/uL (ref 0–0.7)
Eosinophils Relative: 1 %
HEMATOCRIT: 40.2 % (ref 40.0–52.0)
HEMOGLOBIN: 14.1 g/dL (ref 13.0–18.0)
LYMPHS PCT: 13 %
Lymphs Abs: 1.1 10*3/uL (ref 1.0–3.6)
MCH: 37.3 pg — ABNORMAL HIGH (ref 26.0–34.0)
MCHC: 35 g/dL (ref 32.0–36.0)
MCV: 106.4 fL — AB (ref 80.0–100.0)
MONO ABS: 0.6 10*3/uL (ref 0.2–1.0)
Monocytes Relative: 7 %
NEUTROS ABS: 6.6 10*3/uL — AB (ref 1.4–6.5)
NEUTROS PCT: 78 %
Platelets: 337 10*3/uL (ref 150–440)
RBC: 3.78 MIL/uL — AB (ref 4.40–5.90)
RDW: 14.3 % (ref 11.5–14.5)
WBC: 8.4 10*3/uL (ref 3.8–10.6)

## 2017-03-27 LAB — BASIC METABOLIC PANEL
ANION GAP: 8 (ref 5–15)
BUN: 14 mg/dL (ref 6–20)
CHLORIDE: 101 mmol/L (ref 101–111)
CO2: 27 mmol/L (ref 22–32)
CREATININE: 0.72 mg/dL (ref 0.61–1.24)
Calcium: 8.6 mg/dL — ABNORMAL LOW (ref 8.9–10.3)
GFR calc Af Amer: >60 mL/min (ref >60)
GFR calc non Af Amer: >60 mL/min (ref >60)
Glucose, Bld: 106 mg/dL — ABNORMAL HIGH (ref 65–99)
POTASSIUM: 4.1 mmol/L (ref 3.5–5.1)
Sodium: 136 mmol/L (ref 135–145)

## 2017-03-27 MED ORDER — DOXYCYCLINE HYCLATE 100 MG PO TABS
ORAL_TABLET | ORAL | Status: AC
  Administered 2017-03-27: 100 mg via ORAL
  Filled 2017-03-27: qty 1

## 2017-03-27 MED ORDER — DOXYCYCLINE HYCLATE 100 MG PO TABS
100.0000 mg | ORAL_TABLET | Freq: Two times a day (BID) | ORAL | Status: DC
  Administered 2017-03-27: 100 mg via ORAL

## 2017-03-27 MED ORDER — DOXYCYCLINE HYCLATE 100 MG PO CAPS: 100.0000 mg | ORAL_CAPSULE | Freq: Two times a day (BID) | ORAL | 8 refills | Status: DC

## 2017-03-27 NOTE — ED Triage Notes (Signed)
Pt brought in from group home in [place] with reports of new rash noted to right hand, looks like a burn blister. Caregiver just noticed today. 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

## 2017-03-27 NOTE — ED Provider Notes (Signed)
[facility] Emergency Department Provider Note   ____________________________________________   I have reviewed the triage vital signs and the nursing notes.   HISTORY  Chief Complaint Hand Pain and Rash   History limited by: MR - history obtained from caregiver   HPI Jesus Morton is a 61 y.o. male who presents to the emergency department today accompanied by caregiver. Patient was brought to the emergency department today because of some blistering to the right hand. The caregiver first noticed this blistering after the patient came back from daycare today. The patient has not appeared to be in any discomfort from this. The caregivers unaware that the patient would have any exposure to anything hot or poison ivy at the daycare. In addition to the hand blisters that started today the caregiver has some concern for a sacral ulcer. The patient is being followed by wound care center but they feel like it has gotten worse recently. Patient has not had any fevers.   Past Medical History:  Diagnosis Date  . Anemia   . Dementia   . Down syndrome   . GERD (gastroesophageal reflux disease)   . Osteoporosis     Patient Active Problem List   Diagnosis Date Noted  . Macrocytosis 11/02/2016  . Dementia 07/19/2016  . Down syndrome 07/19/2016  . Foreign body alimentary tract 06/22/2016  . Abnormal findings on esophagogastroduodenoscopy (EGD) 06/22/2016  . Hemorrhagic esophagitis 06/22/2016  . Hypotension 06/22/2016  . Aspiration pneumonia ([place]) 06/22/2016  . Foreign body aspiration   . Cough   . Pneumonitis due to food and vomit ([place])   . Problems with swallowing and mastication   . Aspiration of foreign body   . Aspiration pneumonia of right lower lobe ([place]) 06/20/2016  . Cellulitis of right hand 12/24/2015  . Reflux esophagitis 03/10/2014  . Hematemesis 02/16/2014  . Inguinal hernia 12/18/2013  . Cellulitis of right arm 11/28/2013  . Multiple facial  fractures ([place]) 03/05/2013  . GERD (gastroesophageal reflux disease) 01/28/2013  . Dysphagia 05/31/2012  . Cellulitis and abscess of leg 06/21/2011  . Corns and callosity 04/26/2011  . Onychomycosis due to dermatophyte 04/26/2011  . Osteoporosis 04/29/2010    Past Surgical History:  Procedure Laterality Date  . ESOPHAGOGASTRODUODENOSCOPY (EGD) WITH PROPOFOL N/A 06/21/2016   Procedure: ESOPHAGOGASTRODUODENOSCOPY (EGD) WITH PROPOFOL;  Surgeon: Lucilla Lame, MD;  Location: [facility] ENDOSCOPY;  Service: Endoscopy;  Laterality: N/A;  . HERNIA REPAIR      Prior to Admission medications   Medication Sig Start Date End Date Taking? Authorizing Provider  acetaminophen (TYLENOL) 500 MG tablet Take 500 mg by mouth every 6 (six) hours as needed.    [provider]  amoxicillin-clavulanate (AUGMENTIN ES-600) 600-42.9 MG/5ML suspension Take 5 mLs (600 mg total) by mouth 3 (three) times daily. Patient not taking: Reported on 07/19/2016 06/22/16   Theodoro Grist, MD  artificial tears (LACRILUBE) OINT ophthalmic ointment Place into both eyes at bedtime. 0.25 inch strip to both eyes at bedtime 06/22/16   Theodoro Grist, MD  calcium carbonate (OS-CAL) 600 MG tablet Take 600 mg by mouth twice daily. 05/13/16   [provider]  cholecalciferol (VITAMIN D) 1000 units tablet Take 1,000 Units by mouth daily.    [provider]  dextromethorphan (DELSYM) 30 MG/5ML liquid Take 5 mLs (30 mg total) by mouth 2 (two) times daily. Patient not taking: Reported on 07/19/2016 06/22/16   Theodoro Grist, MD  guaiFENesin (MUCINEX) 600 MG 12 hr tablet Take 1 tablet (600 mg total)  by mouth 2 (two) times daily. Patient not taking: Reported on 12/27/2016 06/22/16   Theodoro Grist, MD  hydroxypropyl methylcellulose / hypromellose (ISOPTO TEARS / GONIOVISC) 2.5 % ophthalmic solution Place 1 drop into both eyes every 2 (two) hours while awake. 06/22/16   Theodoro Grist, MD  loratadine (CLARITIN) 10 MG tablet Take  10 mg by mouth daily.    [provider]  LORazepam (ATIVAN) 0.5 MG tablet Take 1 tablet (0.5 mg total) by mouth daily as needed for anxiety. 06/22/16   Theodoro Grist, MD  Melatonin 3 MG TABS Take 1 tablet by mouth daily.    [provider]  memantine (NAMENDA) 10 MG tablet Take 10 mg by mouth 2 (two) times daily.     [provider]  Multiple Vitamins-Minerals (MULTIVITAMIN ADULT) TABS Multivitamin Adult Oral Tablet QTY: 0 tablet Days: 0 Refills: 0  Written: 05/13/16 Patient Instructions:  05/13/16   [provider]  Naftifine HCl (NAFTIN) 1 % GEL Apply topically. Between toes    [provider]  nystatin-triamcinolone (MYCOLOG II) cream Apply 1 application topically 2 (two) times daily. Affected area of elbox flexor and groin    [provider]  pantoprazole (PROTONIX) 40 MG tablet Take 1 tablet (40 mg total) by mouth daily. 06/22/16   Theodoro Grist, MD  Polyethylene Glycol 3350 (MIRALAX PO) MiraLax Oral Powder QTY: 0  Days: 0 Refills: 0  Written: 05/13/16 Patient Instructions: 17gm prn 05/13/16   [provider]  Probiotic Product (ALIGN PO) Take by mouth.    [provider]  sodium chloride (OCEAN) 0.65 % SOLN nasal spray Place 1 spray into both nostrils as needed for congestion.    [provider]  sucralfate (CARAFATE) 1 GM/10ML suspension Take 10 mLs (1 g total) by mouth 4 (four) times daily -  with meals and at bedtime. 07/19/16   Lucilla Lame, MD  vitamin B-12 (CYANOCOBALAMIN) 1000 MCG tablet Take 1,000 mcg by mouth daily.    [provider]    Allergies Patient has no known allergies.  Family History  Problem Relation Age of Onset  . Multiple myeloma Father   . Brain cancer Father   . Bone cancer Father   . Leukemia Sister   . Breast cancer Paternal Aunt   . Bladder Cancer Maternal Grandmother   . Leukemia Paternal Grandfather     Social History Social History  Substance Use Topics  .  Smoking status: Never Smoker  . Smokeless tobacco: Never Used  . Alcohol use No    Review of Systems - unable to obtain a full review of systems. Limited review of systems obtained from caregiver. Constitutional: No fever/chills Cardiovascular: Denies chest pain. Respiratory: Denies shortness of breath. Gastrointestinal: No abdominal pain.  No nausea, no vomiting.  No diarrhea.   Skin: blisters to right wrist, sacral ulcer  ____________________________________________   PHYSICAL EXAM:  VITAL SIGNS: ED Triage Vitals [03/27/17 1725]  Enc Vitals Group     BP (!) 117/94     Pulse Rate 84     Resp 20     Temp 98.6 F (37 C)     Temp Source Oral     SpO2 98 %     Weight 96 lb (43.5 kg)    Constitutional: Alert and oriented. no acute distress Eyes: Conjunctivae are normal.  ENT   Head: Normocephalic and atraumatic.   Nose: No congestion/rhinnorhea.   Mouth/Throat: Mucous membranes are moist.   Neck: No stridor. Hematological/Lymphatic/Immunilogical: No cervical  lymphadenopathy. Cardiovascular: Normal rate, regular rhythm.  No murmurs, rubs, or gallops.  Respiratory: Normal respiratory effort without tachypnea nor retractions. Breath sounds are clear and equal bilaterally. No wheezes/rales/rhonchi. Gastrointestinal: Soft and non tender. No rebound. No guarding.  Genitourinary: Deferred Musculoskeletal: Normal range of motion in all extremities. No lower extremity edema. Neurologic:  sequela of Down syndrome Skin:  sacral decubitus ulcer noted. Is some surrounding erythema. In addition patient with a few isolated blisters around the right wrist. No surrounding erythema of these lesions.  ____________________________________________    LABS (pertinent positives/negatives)  Labs Reviewed  CBC WITH DIFFERENTIAL/PLATELET - Abnormal; Notable for the following:       Result Value   RBC 3.78 (*)    MCV 106.4 (*)    MCH 37.3 (*)    Neutro Abs 6.6 (*)    All other  components within normal limits  BASIC METABOLIC PANEL - Abnormal; Notable for the following:    Glucose, Bld 106 (*)    Calcium 8.6 (*)    All other components within normal limits     ____________________________________________   EKG  None  ____________________________________________    RADIOLOGY  None  ____________________________________________   PROCEDURES  Procedures  ____________________________________________   INITIAL IMPRESSION / ASSESSMENT AND PLAN / ED COURSE  Pertinent labs & imaging results that were available during my care of the patient were reviewed by me and considered in my medical decision making (see chart for details).  patient presented to the emergency department today with concerns for new blisters around the right wrist. These are of unclear etiology at this time. I do wonder if patient had some dermatitis. It almost does look like poison ivy although the patient does not appear to have any contact. Discussed watching with caregiver. In terms of sacral decub do think likely infected at this time. Will start patient on antibiotics.   ____________________________________________   FINAL CLINICAL IMPRESSION(S) / ED DIAGNOSES  Final diagnoses:  Rash  Wound infection     Note: This dictation was prepared with Dragon dictation. Any transcriptional errors that result from this process are unintentional     Fehrman Pear, MD 03/27/17 2237

## 2017-03-27 NOTE — ED Notes (Signed)
Pt caregiver reports that pt has blisters on right hand and reports right side of face is swollen - pt also has a wound on his buttocks that he sees the wound doctor for but caregiver reports that the area is now draining and "looks worse"

## 2017-03-27 NOTE — Discharge Instructions (Signed)
Please seek medical attention for any high fevers, chest pain, shortness of breath, change in behavior, persistent vomiting, bloody stool or any other new or concerning symptoms.  

## 2017-03-27 NOTE — Progress Notes (Signed)
EDGARDO, PETRENKO ([id number]) Visit Report for 03/23/2017 Arrival Information Details Patient Name: [name], [name] 03/23/2017 8:15 Date of Service: AM Medical Record [id number] Number: Patient Account Number: [id number] Date of Birth/Sex: 1956-03-13 (61 y.o. Male) Treating RN: Lurlean Leyden, Other Clinician: Primary Care Elida Harbin: French Ana Treating Britto, Verdia Kuba, Javar Eshbach/Extender: Referring Rilan Eiland: Delmer Islam in Treatment: 2 Visit Information History Since Last Visit Any new allergies or adverse reactions: No Patient Arrived: Wheel Chair Had a fall or experienced change in No activities of daily living that may affect Arrival Time: 08:41 risk of falls: Accompanied By: caregivers Signs or symptoms of abuse/neglect since last No Transfer Assistance: Manual visito Patient Identification Verified: Yes Hospitalized since last visit: No Secondary Verification Process Yes Has Dressing in Place as Prescribed: Yes Completed: Pain Present Now: No Patient Requires Transmission-Based No Precautions: Patient Has Alerts: No Electronic Signature(s) Signed: 03/23/2017 5:55:00 PM By: Elliot Gurney, BSN, RN, CWS, Kim RN, BSN Entered By: Elliot Gurney, BSN, RN, CWS, Kim on 03/23/2017 08:41:49 Jesus Morton, Jesus Morton ([id number]) -------------------------------------------------------------------------------- Encounter Discharge Information Details Patient Name: Jesus Morton, Jesus Morton 03/23/2017 8:15 Date of Service: AM Medical Record [id number] Number: Patient Account Number: [id number] Date of Birth/Sex: Mar 27, 1956 (61 y.o. Male) Treating RN: Lurlean Leyden, Other Clinician: Primary Care Hortence Charter: French Ana Treating Britto, Verdia Kuba, Addalyne Vandehei/Extender: Referring Malik Ruffino: Delmer Islam in Treatment: 2 Encounter Discharge Information Items Discharge Pain Level: 0 Discharge Condition: Stable Ambulatory Status: Wheelchair Discharge Destination:  Home Transportation: Private Auto Accompanied By: self Schedule Follow-up Appointment: Yes Medication Reconciliation completed and provided to Patient/Care Yes Vrishank Moster: Provided on Clinical Summary of Care: 03/23/2017 Form Type Recipient Paper Patient [place] Electronic Signature(s) Signed: 03/27/2017 10:13:53 AM By: Gwenlyn Perking Entered By: Gwenlyn Perking on 03/23/2017 09:01:43 [name] ([id number]) -------------------------------------------------------------------------------- Lower Extremity Assessment Details Patient Name: Jesus Morton, Jesus Morton 03/23/2017 8:15 Date of Service: AM Medical Record [id number] Number: Patient Account Number: [id number] Date of Birth/Sex: March 21, 1956 (61 y.o. Male) Treating RN: Lurlean Leyden, Other Clinician: Primary Care Vincent Streater: French Ana Treating Britto, Verdia Kuba, Maansi Wike/Extender: Referring Akeel Reffner: Delmer Islam in Treatment: 2 Electronic Signature(s) Signed: 03/23/2017 5:55:00 PM By: Elliot Gurney, BSN, RN, CWS, Kim RN, BSN Entered By: Elliot Gurney, BSN, RN, CWS, Kim on 03/23/2017 08:57:26 [name] ([id number]) -------------------------------------------------------------------------------- Multi Wound Chart Details Patient Name: Jesus Morton, Jesus Morton 03/23/2017 8:15 Date of Service: AM Medical Record [id number] Number: Patient Account Number: [id number] Date of Birth/Sex: 03-Dec-1955 (61 y.o. Male) Treating RN: Lurlean Leyden, Other Clinician: Primary Care Dalexa Gentz: French Ana Treating Britto, Verdia Kuba, Rock Sobol/Extender: Referring Carson Meche: Delmer Islam in Treatment: 2 Vital Signs Height(in): 60 Pulse(bpm): 87 Weight(lbs): 92 Blood Pressure 106/42 (mmHg): Body Mass Index(BMI): 18 Temperature(F): 98.2 Respiratory Rate 16 (breaths/min): Photos: [1:No Photos] [N/A:N/A] Wound Location: [1:Right Sacrum] [N/A:N/A] Wounding Event: [1:Pressure Injury] [N/A:N/A] Primary Etiology: [1:Pressure Ulcer]  [N/A:N/A] Comorbid History: [1:Anemia, Hypotension, Dementia] [N/A:N/A] Date Acquired: [1:02/16/2017] [N/A:N/A] Weeks of Treatment: [1:2] [N/A:N/A] Wound Status: [1:Open] [N/A:N/A] Measurements L x W x D 3.5x3x0.1 [N/A:N/A] (cm) Area (cm) : [1:8.247] [N/A:N/A] Volume (cm) : [1:0.825] [N/A:N/A] % Reduction in Area: [1:19.20%] [N/A:N/A] % Reduction in Volume: 19.20% [N/A:N/A] Classification: [1:Category/Stage III] [N/A:N/A] Exudate Amount: [1:Large] [N/A:N/A] Exudate Type: [1:Serous] [N/A:N/A] Exudate Color: [1:amber] [N/A:N/A] Wound Margin: [1:Flat and Intact] [N/A:N/A] Granulation Amount: [1:Small (1-33%)] [N/A:N/A] Granulation Quality: [1:Red, Pink] [N/A:N/A] Necrotic Amount: [1:Large (67-100%)] [N/A:N/A] Necrotic Tissue: [1:Eschar, Adherent Slough] [N/A:N/A] Exposed Structures: [1:Fat Layer (Subcutaneous Tissue) Exposed: Yes Fascia: No] [N/A:N/A] Tendon: No Muscle: No Joint: No Bone: No Epithelialization: None N/A N/A Debridement: Chemical/enzymatic - N/A N/A Non-Selective Pre-procedure 08:50 N/A N/A Verification/Time Out Taken: Procedural Pain:  0 N/A N/A Post Procedural Pain: 0 N/A N/A Debridement Treatment Procedure was tolerated N/A N/A Response: well Post Debridement 3.5x3.3x0.1 N/A N/A Measurements L x W x D (cm) Post Debridement 0.907 N/A N/A Volume: (cm) Post Debridement Category/Stage III N/A N/A Stage: Periwound Skin Texture: No Abnormalities Noted N/A N/A Periwound Skin Maceration: Yes N/A N/A Moisture: Periwound Skin Color: Ecchymosis: Yes N/A N/A Erythema: Yes Erythema Location: Circumferential N/A N/A Temperature: No Abnormality N/A N/A Tenderness on Yes N/A N/A Palpation: Wound Preparation: Ulcer Cleansing: N/A N/A Rinsed/Irrigated with Saline Topical Anesthetic Applied: Other: lidocaine 4% Procedures Performed: Debridement N/A N/A Treatment Notes Wound #1 (Right Sacrum) 1. Cleansed with: Clean wound with Normal Saline 2.  Anesthetic Topical Lidocaine 4% cream to wound bed prior to debridement 4. Dressing Applied: Santyl Ointment 5. Secondary Dressing Applied Bordered Foam Dressing Jesus Morton, Jesus Morton ([id number]) Electronic Signature(s) Signed: 03/23/2017 5:02:50 PM By: Evlyn Kanner MD, FACS Entered By: Evlyn Kanner on 03/23/2017 09:23:22 Jesus Morton, Jesus Morton ([id number]) -------------------------------------------------------------------------------- Multi-Disciplinary Care Plan Details Patient Name: Jesus Morton, Jesus Morton 03/23/2017 8:15 Date of Service: AM Medical Record [id number] Number: Patient Account Number: [id number] Date of Birth/Sex: 1955/07/19 (61 y.o. Male) Treating RN: Lurlean Leyden, Other Clinician: Primary Care Letisha Yera: French Ana Treating Britto, Verdia Kuba, Tiffany Talarico/Extender: Referring Kden Wagster: Delmer Islam in Treatment: 2 Active Inactive ` Abuse / Safety / Falls / Self Care Management Nursing Diagnoses: Potential for falls Goals: Patient will not experience any injury related to falls Date Initiated: 03/09/2017 Target Resolution Date: 06/17/2017 Goal Status: Active Interventions: Assess Activities of Daily Living upon admission and as needed Assess fall risk on admission and as needed Assess: immobility, friction, shearing, incontinence upon admission and as needed Assess impairment of mobility on admission and as needed per policy Notes: ` Nutrition Nursing Diagnoses: Imbalanced nutrition Potential for alteratiion in Nutrition/Potential for imbalanced nutrition Goals: Patient/caregiver agrees to and verbalizes understanding of need to use nutritional supplements and/or vitamins as prescribed Date Initiated: 03/09/2017 Target Resolution Date: 06/17/2017 Goal Status: Active Interventions: Assess patient nutrition upon admission and as needed per policy Jesus Morton, Jesus Morton ([id number]) Provide education on nutrition Treatment Activities: Education provided on  Nutrition : 03/09/2017 Notes: ` Orientation to the Wound Care Program Nursing Diagnoses: Knowledge deficit related to the wound healing center program Goals: Patient/caregiver will verbalize understanding of the Wound Healing Center Program Date Initiated: 03/09/2017 Target Resolution Date: 03/18/2017 Goal Status: Active Interventions: Provide education on orientation to the wound center Notes: ` Pain, Acute or Chronic Nursing Diagnoses: Pain, acute or chronic: actual or potential Potential alteration in comfort, pain Goals: Patient/caregiver will verbalize adequate pain control between visits Date Initiated: 03/09/2017 Target Resolution Date: 06/17/2017 Goal Status: Active Interventions: Complete pain assessment as per visit requirements Notes: ` Pressure Nursing Diagnoses: Knowledge deficit related to causes and risk factors for pressure ulcer development Knowledge deficit related to management of pressures ulcers Potential for impaired tissue integrity related to pressure, friction, moisture, and shear Epperly, Karmello ([id number]) Goals: Patient will remain free from development of additional pressure ulcers Date Initiated: 03/09/2017 Target Resolution Date: 06/17/2017 Goal Status: Active Interventions: Assess: immobility, friction, shearing, incontinence upon admission and as needed Provide education on pressure ulcers Notes: Electronic Signature(s) Signed: 03/23/2017 5:55:00 PM By: Elliot Gurney, BSN, RN, CWS, Kim RN, BSN Entered By: Elliot Gurney, BSN, RN, CWS, Kim on 03/23/2017 08:57:44 [name] ([id number]) -------------------------------------------------------------------------------- Pain Assessment Details Patient Name: Jesus Morton, Jesus Morton 03/23/2017 8:15 Date of Service: AM Medical Record [id number] Number: Patient Account Number: [id number] Date of Birth/Sex: 26-Oct-1955 (61 y.o. Male) Treating RN: Huel Coventry  MCLEAN-SCOCOZZA, Other Clinician: Primary Care Cassanda Walmer: TRACY  Treating Britto, Verdia Kuba, Beauty Pless/Extender: Referring Carry Weesner: Delmer Islam in Treatment: 2 Active Problems Location of Pain Severity and Description of Pain Patient Has Paino No Site Locations With Dressing Change: No Pain Management and Medication Current Pain Management: Goals for Pain Management Topical or injectable lidocaine is offered to patient for acute pain when surgical debridement is performed. If needed, Patient is instructed to use over the counter pain medication for the following 24-48 hours after debridement. Wound care MDs do not prescribed pain medications. Patient has chronic pain or uncontrolled pain. Patient has been instructed to make an appointment with their Primary Care Physician for pain management. Electronic Signature(s) Signed: 03/23/2017 5:55:00 PM By: Elliot Gurney, BSN, RN, CWS, Kim RN, BSN Entered By: Elliot Gurney, BSN, RN, CWS, Kim on 03/23/2017 08:42:22 Jesus Morton, Jesus Morton ([id number]) -------------------------------------------------------------------------------- Patient/Caregiver Education Details Patient Name: Jesus Morton, Jesus Morton 03/23/2017 8:15 Date of Service: AM Medical Record [id number] Number: Patient Account Number: [id number] Date of Birth/Gender: [birth year] (61 y.o. Male) Treating RN: Lurlean Leyden, Other Clinician: Primary Care Physician: French Ana Treating Britto, Verdia Kuba, Physician/Extender: Referring Physician: Delmer Islam in Treatment: 2 Education Assessment Education Provided To: Patient Education Topics Provided Pressure: Handouts: Pressure Ulcers: Care and Offloading Methods: Demonstration, Explain/Verbal Responses: State content correctly Welcome To The [facility]: Electronic Signature(s) Signed: 03/23/2017 5:55:00 PM By: Elliot Gurney, BSN, RN, CWS, Kim RN, BSN Entered By: Elliot Gurney, BSN, RN, CWS, Kim on 03/23/2017 09:01:32 [name]  ([id number]) -------------------------------------------------------------------------------- Wound Assessment Details Patient Name: KINGSLEY, FARACE 03/23/2017 8:15 Date of Service: AM Medical Record [id number] Number: Patient Account Number: [id number] Date of Birth/Sex: 06-17-1956 (61 y.o. Male) Treating RN: Lurlean Leyden, Other Clinician: Primary Care Marvyn Torrez: French Ana Treating Britto, Verdia Kuba, Gaynelle Pastrana/Extender: Referring Terrin Imparato: Delmer Islam in Treatment: 2 Wound Status Wound Number: 1 Primary Etiology: Pressure Ulcer Wound Location: Right Sacrum Wound Status: Open Wounding Event: Pressure Injury Comorbid History: Anemia, Hypotension, Dementia Date Acquired: 02/16/2017 Weeks Of Treatment: 2 Clustered Wound: No Photos Photo Uploaded By: Elliot Gurney, BSN, RN, CWS, Kim on 03/23/2017 13:46:52 Wound Measurements Length: (cm) 3.5 Width: (cm) 3 Depth: (cm) 0.1 Area: (cm) 8.247 Volume: (cm) 0.825 % Reduction in Area: 19.2% % Reduction in Volume: 19.2% Epithelialization: None Tunneling: No Undermining: No Wound Description Classification: Category/Stage III Foul Odor After Wound Margin: Flat and Intact Slough/Fibrino Exudate Amount: Large Exudate Type: Serous Exudate Color: amber Cleansing: No Yes Wound Bed Granulation Amount: Small (1-33%) Exposed Structure Granulation Quality: Red, Pink Fascia Exposed: No Necrotic Amount: Large (67-100%) Fat Layer (Subcutaneous Tissue) Exposed: Yes Necrotic Quality: Eschar, Adherent Slough Tendon Exposed: No Bais, Eliu ([id number]) Muscle Exposed: No Joint Exposed: No Bone Exposed: No Periwound Skin Texture Texture Color No Abnormalities Noted: No No Abnormalities Noted: No Ecchymosis: Yes Moisture Erythema: Yes No Abnormalities Noted: No Erythema Location: Circumferential Maceration: Yes Temperature / Pain Temperature: No Abnormality Tenderness on Palpation: Yes Wound Preparation Ulcer  Cleansing: Rinsed/Irrigated with Saline Topical Anesthetic Applied: Other: lidocaine 4%, Treatment Notes Wound #1 (Right Sacrum) 1. Cleansed with: Clean wound with Normal Saline 2. Anesthetic Topical Lidocaine 4% cream to wound bed prior to debridement 4. Dressing Applied: Santyl Ointment 5. Secondary Dressing Applied Bordered Foam Dressing Electronic Signature(s) Signed: 03/23/2017 5:55:00 PM By: Elliot Gurney, BSN, RN, CWS, Kim RN, BSN Entered By: Elliot Gurney, BSN, RN, CWS, Kim on 03/23/2017 08:46:33 DARYON, REMMERT ([id number]) -------------------------------------------------------------------------------- Vitals Details Patient Name: RAYVON, DAKIN 03/23/2017 8:15 Date of Service: AM Medical Record [id number] Number: Patient Account Number: [id number] Date of Birth/Sex: 06-Jul-1956 (61 y.o. Male)  Treating RN: Lurlean Leyden, Other Clinician: Primary Care Broxton Broady: French Ana Treating Britto, Verdia Kuba, Lashonta Pilling/Extender: Referring Laymon Stockert: Delmer Islam in Treatment: 2 Vital Signs Time Taken: 08:42 Temperature (F): 98.2 Height (in): 60 Pulse (bpm): 87 Weight (lbs): 92 Respiratory Rate (breaths/min): 16 Body Mass Index (BMI): 18 Blood Pressure (mmHg): 106/42 Reference Range: 80 - 120 mg / dl Electronic Signature(s) Signed: 03/23/2017 5:55:00 PM By: Elliot Gurney, BSN, RN, CWS, Kim RN, BSN Entered By: Elliot Gurney, BSN, RN, CWS, Kim on 03/23/2017 08:42:40

## 2017-04-03 ENCOUNTER — Encounter: Payer: Medicare Other | Admitting: Surgery

## 2017-04-03 DIAGNOSIS — L89313 Pressure ulcer of right buttock, stage 3: Secondary | ICD-10-CM | POA: Diagnosis not present

## 2017-04-04 NOTE — Progress Notes (Signed)
SAHEED, CARRINGTON ([id number]) Visit Report for 04/03/2017 Allergy List Details Patient Name: Jesus Morton, Jesus Morton 04/03/2017 3:15 Date of Service: PM Medical Record [id number] Number: Patient Account Number: [id number] Date of Birth/Sex: 06-16-1956 (60 y.o. Male) Treating RN: Ermalinda Barrios, Other Clinician: Primary Care Dashun Borre: French Ana Treating Britto, Verdia Kuba, Gaynor Ferreras/Extender: Referring Mala Gibbard: Delmer Islam in Treatment: 3 Allergies Active Allergies Voltaren Allergy Notes Electronic Signature(s) Signed: 04/03/2017 4:26:24 PM By: Alejandro Mulling Entered By: Alejandro Mulling on 04/03/2017 16:08:23 KALLON, CAYLOR ([id number]) -------------------------------------------------------------------------------- Arrival Information Details Patient Name: Jesus Morton, Jesus Morton 04/03/2017 3:15 Date of Service: PM Medical Record [id number] Number: Patient Account Number: [id number] Date of Birth/Sex: 1955/09/10 (61 y.o. Male) Treating RN: Ermalinda Barrios, Other Clinician: Primary Care Marquize Seib: French Ana Treating Britto, Verdia Kuba, Lark Runk/Extender: Referring Song Garris: Delmer Islam in Treatment: 3 Visit Information Patient Arrived: Wheel Chair Arrival Time: 15:23 Accompanied By: caregivers, sister Transfer Assistance: Other Patient Identification Verified: Yes Secondary Verification Process Yes Completed: Patient Requires Transmission- No Based Precautions: Patient Has Alerts: No History Since Last Visit All ordered tests and consults were completed: No Added or deleted any medications: No Any new allergies or adverse reactions: No Had a fall or experienced change in activities of daily living that may affect risk of falls: No Signs or symptoms of abuse/neglect since last visito No Hospitalized since last visit: No Has Dressing in Place as Prescribed: Yes Electronic Signature(s) Signed: 04/03/2017 4:26:24 PM By: Alejandro Mulling Entered By: Alejandro Mulling on 04/03/2017 15:23:41 [name] ([id number]) -------------------------------------------------------------------------------- Encounter Discharge Information Details Patient Name: Jesus Morton, Jesus Morton 04/03/2017 3:15 Date of Service: PM Medical Record [id number] Number: Patient Account Number: [id number] Date of Birth/Sex: 30-Sep-1955 (61 y.o. Male) Treating RN: Ermalinda Barrios, Other Clinician: Primary Care Lars Jeziorski: French Ana Treating Britto, Verdia Kuba, Hollye Pritt/Extender: Referring Vlad Mayberry: Delmer Islam in Treatment: 3 Encounter Discharge Information Items Discharge Pain Level: 0 Discharge Condition: Stable Ambulatory Status: Wheelchair Discharge Destination: Nursing Home Transportation: Private Auto caregivers, Accompanied By: sister Schedule Follow-up Appointment: Yes Medication Reconciliation completed and provided to Patient/Care No Dominika Losey: Provided on Clinical Summary of Care: 04/03/2017 Form Type Recipient Paper Patient [place] Electronic Signature(s) Signed: 04/03/2017 4:24:40 PM By: Gwenlyn Perking Entered By: Gwenlyn Perking on 04/03/2017 16:00:53 [name] ([id number]) -------------------------------------------------------------------------------- Lower Extremity Assessment Details Patient Name: Jesus Morton, Jesus Morton 04/03/2017 3:15 Date of Service: PM Medical Record [id number] Number: Patient Account Number: [id number] Date of Birth/Sex: 06-08-1956 (61 y.o. Male) Treating RN: Ermalinda Barrios, Other Clinician: Primary Care Ishia Tenorio: French Ana Treating Britto, Verdia Kuba, Crystelle Ferrufino/Extender: Referring Lafonda Patron: Delmer Islam in Treatment: 3 Electronic Signature(s) Signed: 04/03/2017 4:26:24 PM By: Alejandro Mulling Entered By: Alejandro Mulling on 04/03/2017 15:35:53 [name] ([id number]) -------------------------------------------------------------------------------- Multi  Wound Chart Details Patient Name: Jesus Morton, Jesus Morton 04/03/2017 3:15 Date of Service: PM Medical Record [id number] Number: Patient Account Number: [id number] Date of Birth/Sex: [birth year] (61 y.o. Male) Treating RN: Ermalinda Barrios, Other Clinician: Primary Care Ottie Tillery: French Ana Treating Britto, Verdia Kuba, Camiyah Friberg/Extender: Referring Novalee Horsfall: Delmer Islam in Treatment: 3 Vital Signs Height(in): 60 Pulse(bpm): 64 Weight(lbs): 92 Blood Pressure 102/42 (mmHg): Body Mass Index(BMI): 18 Temperature(F): 98.1 Respiratory Rate 16 (breaths/min): Photos: [1:No Photos] [N/A:N/A] Wound Location: [1:Right Sacrum] [N/A:N/A] Wounding Event: [1:Pressure Injury] [N/A:N/A] Primary Etiology: [1:Pressure Ulcer] [N/A:N/A] Comorbid History: [1:Anemia, Hypotension, Dementia] [N/A:N/A] Date Acquired: [1:02/16/2017] [N/A:N/A] Weeks of Treatment: [1:3] [N/A:N/A] Wound Status: [1:Open] [N/A:N/A] Measurements L x W x D 3.9x3x1.5 [N/A:N/A] (cm) Area (cm) : [1:9.189] [N/A:N/A] Volume (cm) : [1:13.784] [N/A:N/A] % Reduction in Area: [1:10.00%] [N/A:N/A] % Reduction in Volume: -1250.00% [N/A:N/A] Starting Position 1 12 (o'clock): Ending  Position 1 [1:7] (o'clock): Maximum Distance 1 3.9 (cm): Undermining: [1:Yes] [N/A:N/A] Classification: [1:Category/Stage III] [N/A:N/A] Exudate Amount: [1:Large] [N/A:N/A] Exudate Type: [1:Serous] [N/A:N/A] Exudate Color: [1:amber] [N/A:N/A] Wound Margin: [1:Flat and Intact] [N/A:N/A] Granulation Amount: Small (1-33%) N/A N/A Granulation Quality: Red, Pink N/A N/A Necrotic Amount: Large (67-100%) N/A N/A Necrotic Tissue: Eschar, Adherent Slough N/A N/A Exposed Structures: Fat Layer (Subcutaneous N/A N/A Tissue) Exposed: Yes Fascia: No Tendon: No Muscle: No Joint: No Bone: No Epithelialization: None N/A N/A Debridement: Debridement (16109- N/A N/A 11047) Pre-procedure 15:39 N/A N/A Verification/Time Out Taken: Pain  Control: Lidocaine 4% Topical N/A N/A Solution Tissue Debrided: Fibrin/Slough, Exudates, N/A N/A Subcutaneous Level: Skin/Subcutaneous N/A N/A Tissue Debridement Area (sq 11.7 N/A N/A cm): Instrument: Blade, Forceps N/A N/A Bleeding: Minimum N/A N/A Hemostasis Achieved: Pressure N/A N/A Procedural Pain: 0 N/A N/A Post Procedural Pain: 0 N/A N/A Debridement Treatment Procedure was tolerated N/A N/A Response: well Post Debridement 3.9x3.1x1.5 N/A N/A Measurements L x W x D (cm) Post Debridement 14.243 N/A N/A Volume: (cm) Post Debridement Category/Stage III N/A N/A Stage: Periwound Skin Texture: No Abnormalities Noted N/A N/A Periwound Skin Maceration: Yes N/A N/A Moisture: Periwound Skin Color: Ecchymosis: Yes N/A N/A Erythema: Yes Erythema Location: Circumferential N/A N/A Temperature: No Abnormality N/A N/A Tenderness on Yes N/A N/A Palpation: Wound Preparation: N/A N/A INDY, KUCK ([id number]) Ulcer Cleansing: Rinsed/Irrigated with Saline Topical Anesthetic Applied: Other: lidocaine 4% Procedures Performed: Debridement N/A N/A Treatment Notes Electronic Signature(s) Signed: 04/03/2017 4:01:09 PM By: Evlyn Kanner MD, FACS Entered By: Evlyn Kanner on 04/03/2017 15:55:26 LACY, TAGLIERI ([id number]) -------------------------------------------------------------------------------- Multi-Disciplinary Care Plan Details Patient Name: Jesus Morton, Jesus Morton 04/03/2017 3:15 Date of Service: PM Medical Record [id number] Number: Patient Account Number: [id number] Date of Birth/Sex: 11-09-1955 (61 y.o. Male) Treating RN: Ermalinda Barrios, Other Clinician: Primary Care Naiyana Barbian: French Ana Treating Britto, Verdia Kuba, Enis Riecke/Extender: Referring Jamecia Lerman: Delmer Islam in Treatment: 3 Active Inactive ` Abuse / Safety / Falls / Self Care Management Nursing Diagnoses: Potential for falls Goals: Patient will not experience any injury related to  falls Date Initiated: 03/09/2017 Target Resolution Date: 06/17/2017 Goal Status: Active Interventions: Assess Activities of Daily Living upon admission and as needed Assess fall risk on admission and as needed Assess: immobility, friction, shearing, incontinence upon admission and as needed Assess impairment of mobility on admission and as needed per policy Notes: ` Nutrition Nursing Diagnoses: Imbalanced nutrition Potential for alteratiion in Nutrition/Potential for imbalanced nutrition Goals: Patient/caregiver agrees to and verbalizes understanding of need to use nutritional supplements and/or vitamins as prescribed Date Initiated: 03/09/2017 Target Resolution Date: 06/17/2017 Goal Status: Active Interventions: Assess patient nutrition upon admission and as needed per policy Jesus Morton, Jesus Morton ([id number]) Provide education on nutrition Treatment Activities: Education provided on Nutrition : 03/09/2017 Notes: ` Orientation to the Wound Care Program Nursing Diagnoses: Knowledge deficit related to the wound healing center program Goals: Patient/caregiver will verbalize understanding of the Wound Healing Center Program Date Initiated: 03/09/2017 Target Resolution Date: 03/18/2017 Goal Status: Active Interventions: Provide education on orientation to the wound center Notes: ` Pain, Acute or Chronic Nursing Diagnoses: Pain, acute or chronic: actual or potential Potential alteration in comfort, pain Goals: Patient/caregiver will verbalize adequate pain control between visits Date Initiated: 03/09/2017 Target Resolution Date: 06/17/2017 Goal Status: Active Interventions: Complete pain assessment as per visit requirements Notes: ` Pressure Nursing Diagnoses: Knowledge deficit related to causes and risk factors for pressure ulcer development Knowledge deficit related to management of pressures ulcers Potential for impaired tissue integrity related to pressure, friction, moisture,  and shear Jesus Morton, Jesus Morton ([id number]) Goals: Patient will remain free from development of additional pressure ulcers Date Initiated: 03/09/2017 Target Resolution Date: 06/17/2017 Goal Status: Active Interventions: Assess: immobility, friction, shearing, incontinence upon admission and as needed Provide education on pressure ulcers Notes: Electronic Signature(s) Signed: 04/03/2017 4:26:24 PM By: Alejandro Mulling Entered By: Alejandro Mulling on 04/03/2017 15:35:57 Jesus Morton, Jesus Morton ([id number]) -------------------------------------------------------------------------------- Pain Assessment Details Patient Name: MICHIAH, MUDRY 04/03/2017 3:15 Date of Service: PM Medical Record [id number] Number: Patient Account Number: [id number] Date of Birth/Sex: Aug 29, 1955 (61 y.o. Male) Treating RN: Ermalinda Barrios, Other Clinician: Primary Care Terressa Evola: French Ana Treating Britto, Verdia Kuba, Stefano Trulson/Extender: Referring Lamiah Marmol: Delmer Islam in Treatment: 3 Active Problems Location of Pain Severity and Description of Pain Patient Has Paino Yes Site Locations Rate the pain. Current Pain Level: 4 Character of Pain Describe the Pain: Aching Pain Management and Medication Current Pain Management: Electronic Signature(s) Signed: 04/03/2017 4:26:24 PM By: Alejandro Mulling Entered By: Alejandro Mulling on 04/03/2017 15:24:07 [name] ([id number]) -------------------------------------------------------------------------------- Patient/Caregiver Education Details Patient Name: Jesus Morton, Jesus Morton 04/03/2017 3:15 Date of Service: PM Medical Record [id number] Number: Patient Account Number: [id number] Date of Birth/Gender: [birth year] (61 y.o. Male) Treating RN: Ermalinda Barrios, Other Clinician: Primary Care Physician: French Ana Treating Britto, Verdia Kuba, Physician/Extender: Referring Physician: Delmer Islam in Treatment: 3 Education  Assessment Education Provided To: Patient and Caregiver caregivers, sister Education Topics Provided Wound/Skin Impairment: Handouts: Other: chagne dressing as orders Methods: Demonstration, Explain/Verbal Responses: State content correctly Electronic Signature(s) Signed: 04/03/2017 4:26:24 PM By: Alejandro Mulling Entered By: Alejandro Mulling on 04/03/2017 15:36:54 [name] ([id number]) -------------------------------------------------------------------------------- Wound Assessment Details Patient Name: Jesus Morton, Jesus Morton 04/03/2017 3:15 Date of Service: PM Medical Record [id number] Number: Patient Account Number: [id number] Date of Birth/Sex: Dec 05, 1955 (61 y.o. Male) Treating RN: Ermalinda Barrios, Other Clinician: Primary Care Lyn Deemer: French Ana Treating Britto, Verdia Kuba, Gal Feldhaus/Extender: Referring Naava Janeway: Delmer Islam in Treatment: 3 Wound Status Wound Number: 1 Primary Etiology: Pressure Ulcer Wound Location: Right Sacrum Wound Status: Open Wounding Event: Pressure Injury Comorbid History: Anemia, Hypotension, Dementia Date Acquired: 02/16/2017 Weeks Of Treatment: 3 Clustered Wound: No Photos Photo Uploaded By: Alejandro Mulling on 04/03/2017 16:13:32 Wound Measurements Length: (cm) 3.9 % Reduction in Width: (cm) 3 % Reduction in Depth: (cm) 1.5 Epithelializati Area: (cm) 9.189 Tunneling: Volume: (cm) 13.784 Undermining: Starting Pos Ending Posit Maximum Dist Area: 10% Volume: -1250% on: None No Yes ition (o'clock): 12 ion (o'clock): 7 ance: (cm) 3.9 Wound Description Classification: Category/Stage III Foul Odor Afte Wound Margin: Flat and Intact Slough/Fibrino Exudate Amount: Large Exudate Type: Serous Granito, Jesus Morton ([id number]) r Cleansing: No Yes Exudate Color: amber Wound Bed Granulation Amount: Small (1-33%) Exposed Structure Granulation Quality: Red, Pink Fascia Exposed: No Necrotic Amount: Large  (67-100%) Fat Layer (Subcutaneous Tissue) Exposed: Yes Necrotic Quality: Eschar, Adherent Slough Tendon Exposed: No Muscle Exposed: No Joint Exposed: No Bone Exposed: No Periwound Skin Texture Texture Color No Abnormalities Noted: No No Abnormalities Noted: No Ecchymosis: Yes Moisture Erythema: Yes No Abnormalities Noted: No Erythema Location: Circumferential Maceration: Yes Temperature / Pain Temperature: No Abnormality Tenderness on Palpation: Yes Wound Preparation Ulcer Cleansing: Rinsed/Irrigated with Saline Topical Anesthetic Applied: Other: lidocaine 4%, Treatment Notes Wound #1 (Right Sacrum) 1. Cleansed with: Clean wound with Normal Saline 2. Anesthetic Topical Lidocaine 4% cream to wound bed prior to debridement 3. Peri-wound Care: Skin Prep 4. Dressing Applied: Santyl Ointment 5. Secondary Dressing Applied ABD Pad Dry Gauze 7. Secured with Tape Notes [id number] Electronic Signature(s) Signed: 04/03/2017 4:26:24 PM By: Alejandro Mulling Entered By: Ashok Cordia,  Debra on 04/03/2017 15:33:26 Jesus Morton, ABRAHA ([id number]) CHARLIE, SEDA ([id number]) -------------------------------------------------------------------------------- Vitals Details Patient Name: BRYAN, GOIN 04/03/2017 3:15 Date of Service: PM Medical Record [id number] Number: Patient Account Number: [id number] Date of Birth/Sex: 10/25/1955 (61 y.o. Male) Treating RN: Ermalinda Barrios, Other Clinician: Primary Care Youa Deloney: French Ana Treating Britto, Verdia Kuba, Arnetia Bronk/Extender: Referring Yaretzy Olazabal: Delmer Islam in Treatment: 3 Vital Signs Time Taken: 15:26 Temperature (F): 98.1 Height (in): 60 Pulse (bpm): 64 Weight (lbs): 92 Respiratory Rate (breaths/min): 16 Body Mass Index (BMI): 18 Blood Pressure (mmHg): 102/42 Reference Range: 80 - 120 mg / dl Electronic Signature(s) Signed: 04/03/2017 4:26:24 PM By: Alejandro Mulling Entered By: Alejandro Mulling on  04/03/2017 15:26:53

## 2017-04-04 NOTE — Progress Notes (Addendum)
USBALDO, PANNONE ([id number]) Visit Report for 04/03/2017 Chief Complaint Document Details Patient Name: Jesus Morton, Jesus Morton 04/03/2017 3:15 Date of Service: PM Medical Record [id number] Number: Patient Account Number: [id number] Date of Birth/Sex: 09-Mar-1956 (61 y.o. Male) Treating RN: Ermalinda Barrios, Other Clinician: Primary Care Provider: French Ana Treating Cortnee Steinmiller, Verdia Kuba, Provider/Extender: Referring Provider: Delmer Islam in Treatment: 3 Information Obtained from: Patient Chief Complaint Patient is at the clinic for treatment of an open pressure ulcer to the right medial gluteal and sacral area which she's had for about 3 weeks Electronic Signature(s) Signed: 04/03/2017 4:01:09 PM By: Evlyn Kanner MD, FACS Entered By: Evlyn Kanner on 04/03/2017 15:56:02 [name] ([id number]) -------------------------------------------------------------------------------- Debridement Details Patient Name: [name], Morton 04/03/2017 3:15 Date of Service: PM Medical Record [id number] Number: Patient Account Number: [id number] Date of Birth/Sex: June 17, 1956 (61 y.o. Male) Treating RN: Ermalinda Barrios, Other Clinician: Primary Care Provider: French Ana Treating Deberah Adolf, Verdia Kuba, Provider/Extender: Referring Provider: Delmer Islam in Treatment: 3 Debridement Performed for Wound #1 Right Sacrum Assessment: Performed By: Physician Evlyn Kanner, MD Debridement: Debridement Pre-procedure Verification/Time Out Yes - 15:39 Taken: Start Time: 15:40 Pain Control: Lidocaine 4% Topical Solution Level: Skin/Subcutaneous Tissue/Muscle Total Area Debrided (L x 3.9 (cm) x 3 (cm) = 11.7 (cm) W): Tissue and other Viable, Non-Viable, Fibrin/Slough, Muscle, Skin, Subcutaneous material debrided: Instrument: Blade, Forceps Bleeding: Minimum Hemostasis Achieved: Pressure End Time: 15:42 Procedural Pain: 0 Post Procedural Pain: 0 Response to  Treatment: Procedure was tolerated well Post Debridement Measurements of Total Wound Length: (cm) 3.9 Stage: Category/Stage III Width: (cm) 3.1 Depth: (cm) 1.5 Volume: (cm) 14.243 Character of Wound/Ulcer Post Requires Further Debridement: Debridement Post Procedure Diagnosis Same as Pre-procedure Electronic Signature(s) Signed: 04/03/2017 4:01:09 PM By: Evlyn Kanner MD, FACS Signed: 04/03/2017 4:26:24 PM By: Reinaldo Meeker, Tremane ([id number]) Entered By: Evlyn Kanner on 04/03/2017 15:55:56 JOHNATHON, OLDEN ([id number]) -------------------------------------------------------------------------------- HPI Details Patient Name: Jesus, Morton 04/03/2017 3:15 Date of Service: PM Medical Record [id number] Number: Patient Account Number: [id number] Date of Birth/Sex: Aug 18, 1955 (62 y.o. Male) Treating RN: Ermalinda Barrios, Other Clinician: Primary Care Provider: French Ana Treating Calen Geister, Verdia Kuba, Provider/Extender: Referring Provider: Delmer Islam in Treatment: 3 History of Present Illness Location: right gluteal and sacral area Quality: Patient reports experiencing a dull pain to affected area(s). Severity: Patient states wound are getting worse. Duration: Patient has had the wound for < 3 weeks prior to presenting for treatment Timing: Pain in wound is Intermittent (comes and goes Context: The wound would happen gradually Modifying Factors: Other treatment(s) tried include:oral antibiotics and local care Associated Signs and Symptoms: Patient reports presence of swelling HPI Description: 61 year old male with Down syndrome and dementia has been referred to [place] for a sacral decubitus ulcer. Past medical history significant for GERD, dementia, Down syndrome, status post hiatal hernia repair in June 2015, repair of incarcerated direct inguinal hernia in August 2015, colonoscopy and EGD in the past. On a recent examination his PCP Dr. Richardson Chiquito had  found a stage III pressure ulcer of the sacrum and referred him to the wound clinic. 03/23/2017 -- they're trying their best to offload him but he continues to have problems doing this and has developed a deep tissue injury to the right of his original wound between the 6 and 12:00 position for about 2 cm from the wound edge 04/03/2017 -- patient's sister Marylu Lund is at the bedside and we have discussed the need to completely offload and I have recommended that he does not go to the daycare center where he sitting on  a wheelchair for most of the time. He will be better off on bed with change of position appropriately. Electronic Signature(s) Signed: 04/03/2017 4:01:09 PM By: Evlyn Kanner MD, FACS Entered By: Evlyn Kanner on 04/03/2017 15:56:47 DERK, DOUBEK ([id number]) -------------------------------------------------------------------------------- Physical Exam Details Patient Name: Jesus, Morton 04/03/2017 3:15 Date of Service: PM Medical Record [id number] Number: Patient Account Number: [id number] Date of Birth/Sex: [birth year] (61 y.o. Male) Treating RN: Ermalinda Barrios, Other Clinician: Primary Care Provider: French Ana Treating Jazzmine Kleiman, Verdia Kuba, Provider/Extender: Referring Provider: Delmer Islam in Treatment: 3 Constitutional . Pulse regular. Respirations normal and unlabored. Afebrile. . Eyes Nonicteric. Reactive to light. Ears, Nose, Mouth, and Throat Lips, teeth, and gums WNL.[name] Moist mucosa without lesions. Neck supple and nontender. No palpable supraclavicular or cervical adenopathy. Normal sized without goiter. Respiratory WNL. No retractions.. Cardiovascular Pedal Pulses WNL. No clubbing, cyanosis or edema. Lymphatic No adneopathy. No adenopathy. No adenopathy. Musculoskeletal Adexa without tenderness or enlargement.. Digits and nails w/o clubbing, cyanosis, infection, petechiae, ischemia, or inflammatory conditions.. Integumentary  (Hair, Skin) No suspicious lesions. No crepitus or fluctuance. No peri-wound warmth or erythema. No masses.[name] Psychiatric Judgement and insight Intact.. No evidence of depression, anxiety, or agitation.. Notes there is full-thickness necrosis of skin and subcutaneous tissue and some muscle and I sharply remove this with a #15 blade and a forcep and there was no bleeding. Electronic Signature(s) Signed: 04/03/2017 4:01:09 PM By: Evlyn Kanner MD, FACS Entered By: Evlyn Kanner on 04/03/2017 15:57:21 BRAEDYN, KAUK ([id number]) -------------------------------------------------------------------------------- Physician Orders Details Patient Name: ULYSEES, ROBARTS 04/03/2017 3:15 Date of Service: PM Medical Record [id number] Number: Patient Account Number: [id number] Date of Birth/Sex: 1956-04-16 (61 y.o. Male) Treating RN: Ermalinda Barrios, Other Clinician: Primary Care Provider: French Ana Treating Mayce Noyes, Verdia Kuba, Provider/Extender: Referring Provider: Delmer Islam in Treatment: 3 Verbal / Phone Orders: Yes Clinician: Ashok Cordia, Debi Read Back and Verified: Yes Diagnosis Coding Wound Cleansing Wound #1 Right Sacrum o Clean wound with Normal Saline. o Cleanse wound with mild soap and water o May Shower, gently pat wound dry prior to applying new dressing. - Pt may shower every other day before dressing changes unless soiled. Anesthetic Wound #1 Right Sacrum o Topical Lidocaine 4% cream applied to wound bed prior to debridement - for clinic use Skin Barriers/Peri-Wound Care Wound #1 Right Sacrum o Skin Prep Primary Wound Dressing Wound #1 Right Sacrum o Santyl Ointment Secondary Dressing Wound #1 Right Sacrum o ABD pad o Dry Gauze o Drawtex Dressing Change Frequency Wound #1 Right Sacrum o Change dressing every day. o Other: - change PRN as soiled Follow-up Appointments Wound #1 Right Leor Whyte, Kazimierz  ([id number]) o Return Appointment in 1 week. Off-Loading Wound #1 Right Sacrum o Turn and reposition every 2 hours - PLEASE REPOSITION EVERY 2 HOURS AND AS NEEDED o Other: - Pt needs to take off time from [facility] until further notice because pt needs to not to be sitting in the wheelchair for long periods at a time and needs bed rest for wound healing. Additional Orders / Instructions Wound #1 Right Sacrum o Increase protein intake. Medications-please add to medication list. Wound #1 Right Sacrum o Other: - Take Vitamin A, Vitamin C, Zinc, Multivitamin Patient Medications Allergies: Voltaren Notifications Medication Indication Start End Santyl 04/03/2017 DOSE topical 250 unit/gram ointment - ointment topical as directed Electronic Signature(s) Signed: 04/03/2017 3:55:05 PM By: Evlyn Kanner MD, FACS Entered By: Evlyn Kanner on 04/03/2017 15:55:04 [name] ([id number]) -------------------------------------------------------------------------------- Problem List Details Patient Name: HOKE, BAER 04/03/2017 3:15 Date of  Service: PM Medical Record [id number] Number: Patient Account Number: [id number] Date of Birth/Sex: 11/03/1955 (61 y.o. Male) Treating RN: Ermalinda Barrios, Other Clinician: Primary Care Provider: French Ana Treating Claire Dolores, Verdia Kuba, Provider/Extender: Referring Provider: Delmer Islam in Treatment: 3 Active Problems ICD-10 Encounter Code Description Active Date Diagnosis Q90.9 Down syndrome, unspecified 03/09/2017 Yes L89.313 Pressure ulcer of right buttock, stage 3 03/09/2017 Yes L89.153 Pressure ulcer of sacral region, stage 3 03/09/2017 Yes E44.1 Mild protein-calorie malnutrition 03/09/2017 Yes Inactive Problems Resolved Problems Electronic Signature(s) Signed: 04/03/2017 4:01:09 PM By: Evlyn Kanner MD, FACS Entered By: Evlyn Kanner on 04/03/2017 15:55:19 [name]  ([id number]) -------------------------------------------------------------------------------- Progress Note Details Patient Name: TORIAN, QUINTERO 04/03/2017 3:15 Date of Service: PM Medical Record [id number] Number: Patient Account Number: [id number] Date of Birth/Sex: 14-Aug-1955 (61 y.o. Male) Treating RN: Ermalinda Barrios, Other Clinician: Primary Care Provider: French Ana Treating Man Effertz, Verdia Kuba, Provider/Extender: Referring Provider: Delmer Islam in Treatment: 3 Subjective Chief Complaint Information obtained from Patient Patient is at the clinic for treatment of an open pressure ulcer to the right medial gluteal and sacral area which she's had for about 3 weeks History of Present Illness (HPI) The following HPI elements were documented for the patient's wound: Location: right gluteal and sacral area Quality: Patient reports experiencing a dull pain to affected area(s). Severity: Patient states wound are getting worse. Duration: Patient has had the wound for < 3 weeks prior to presenting for treatment Timing: Pain in wound is Intermittent (comes and goes Context: The wound would happen gradually Modifying Factors: Other treatment(s) tried include:oral antibiotics and local care Associated Signs and Symptoms: Patient reports presence of swelling 61 year old male with Down syndrome and dementia has been referred to [place] for a sacral decubitus ulcer. Past medical history significant for GERD, dementia, Down syndrome, status post hiatal hernia repair in June 2015, repair of incarcerated direct inguinal hernia in August 2015, colonoscopy and EGD in the past. On a recent examination his PCP Dr. Richardson Chiquito had found a stage III pressure ulcer of the sacrum and referred him to the wound clinic. 03/23/2017 -- they're trying their best to offload him but he continues to have problems doing this and has developed a deep tissue injury to the right of his original wound  between the 6 and 12:00 position for about 2 cm from the wound edge 04/03/2017 -- patient's sister Marylu Lund is at the bedside and we have discussed the need to completely offload and I have recommended that he does not go to the daycare center where he sitting on a wheelchair for most of the time. He will be better off on bed with change of position appropriately. Allergies Voltaren [place], Siah ([id number]) Objective Constitutional Pulse regular. Respirations normal and unlabored. Afebrile. Vitals Time Taken: 3:26 PM, Height: 60 in, Weight: 92 lbs, BMI: 18, Temperature: 98.1 F, Pulse: 64 bpm, Respiratory Rate: 16 breaths/min, Blood Pressure: 102/42 mmHg. Eyes Nonicteric. Reactive to light. Ears, Nose, Mouth, and Throat Lips, teeth, and gums WNL.[name] Moist mucosa without lesions. Neck supple and nontender. No palpable supraclavicular or cervical adenopathy. Normal sized without goiter. Respiratory WNL. No retractions.. Cardiovascular Pedal Pulses WNL. No clubbing, cyanosis or edema. Lymphatic No adneopathy. No adenopathy. No adenopathy. Musculoskeletal Adexa without tenderness or enlargement.. Digits and nails w/o clubbing, cyanosis, infection, petechiae, ischemia, or inflammatory conditions.[name] Psychiatric Judgement and insight Intact.. No evidence of depression, anxiety, or agitation.. General Notes: there is full-thickness necrosis of skin and subcutaneous tissue and some muscle and I sharply remove this with a #15  blade and a forcep and there was no bleeding. Integumentary (Hair, Skin) No suspicious lesions. No crepitus or fluctuance. No peri-wound warmth or erythema. No masses.. Wound #1 status is Open. Original cause of wound was Pressure Injury. The wound is located on the Right Sacrum. The wound measures 3.9cm length x 3cm width x 1.5cm depth; 9.189cm^2 area and 13.784cm^3 Hemrick, Jacobie ([id number]) volume. There is Fat Layer (Subcutaneous Tissue) Exposed exposed. There is  no tunneling noted, however, there is undermining starting at 12:00 and ending at 7:00 with a maximum distance of 3.9cm. There is a large amount of serous drainage noted. The wound margin is flat and intact. There is small (1-33%) red, pink granulation within the wound bed. There is a large (67-100%) amount of necrotic tissue within the wound bed including Eschar and Adherent Slough. The periwound skin appearance exhibited: Maceration, Ecchymosis, Erythema. The surrounding wound skin color is noted with erythema which is circumferential. Periwound temperature was noted as No Abnormality. The periwound has tenderness on palpation. Assessment Active Problems ICD-10 Q90.9 - Down syndrome, unspecified L89.313 - Pressure ulcer of right buttock, stage 3 L89.153 - Pressure ulcer of sacral region, stage 3 E44.1 - Mild protein-calorie malnutrition Procedures Wound #1 Pre-procedure diagnosis of Wound #1 is a Pressure Ulcer located on the Right Sacrum . There was a Skin/Subcutaneous Tissue/Muscle Debridement ([id number]) debridement with total area of 11.7 sq cm performed by Evlyn Kanner, MD. with the following instrument(s): Blade and Forceps to remove Viable and Non-Viable tissue/material including Fibrin/Slough, Muscle, Skin, and Subcutaneous after achieving pain control using Lidocaine 4% Topical Solution. A time out was conducted at 15:39, prior to the start of the procedure. A Minimum amount of bleeding was controlled with Pressure. The procedure was tolerated well with a pain level of 0 throughout and a pain level of 0 following the procedure. Post Debridement Measurements: 3.9cm length x 3.1cm width x 1.5cm depth; 14.243cm^3 volume. Post debridement Stage noted as Category/Stage III. Character of Wound/Ulcer Post Debridement requires further debridement. Post procedure Diagnosis Wound #1: Same as Pre-Procedure Plan [place], Malikah ([id number]) Wound Cleansing: Wound #1 Right  Sacrum: Clean wound with Normal Saline. Cleanse wound with mild soap and water May Shower, gently pat wound dry prior to applying new dressing. - Pt may shower every other day before dressing changes unless soiled. Anesthetic: Wound #1 Right Sacrum: Topical Lidocaine 4% cream applied to wound bed prior to debridement - for clinic use Skin Barriers/Peri-Wound Care: Wound #1 Right Sacrum: Skin Prep Primary Wound Dressing: Wound #1 Right Sacrum: Santyl Ointment Secondary Dressing: Wound #1 Right Sacrum: ABD pad Dry Gauze Drawtex Dressing Change Frequency: Wound #1 Right Sacrum: Change dressing every day. Other: - change PRN as soiled Follow-up Appointments: Wound #1 Right Sacrum: Return Appointment in 1 week. Off-Loading: Wound #1 Right Sacrum: Turn and reposition every 2 hours - PLEASE REPOSITION EVERY 2 HOURS AND AS NEEDED Other: - Pt needs to take off time from [place] until further notice because pt needs to not to be sitting in the wheelchair for long periods at a time and needs bed rest for wound healing. Additional Orders / Instructions: Wound #1 Right Sacrum: Increase protein intake. Medications-please add to medication list.: Wound #1 Right Sacrum: Other: - Take Vitamin A, Vitamin C, Zinc, Multivitamin The following medication(s) was prescribed: Santyl topical 250 unit/gram ointment ointment topical as directed starting 04/03/2017 His sister was at the bedside today and I have answered several of her questions to her satisfaction. [place], Sonam ([id number]) After  review today I have recommended: 1. Santyl ointment and a bordered foam, to be changed daily. 2. Offloading has been discussed in great detail -- specific instructions have been given, and he cannot sit on his wheelchair for prolonged periods of time and hence going to the day center may be prohibited for the next week 3. Ambulation as much as possible and appropriate posture changes during the day 4.  Adequate protein, vitamin A, vitamin C and zinc 5. Regular visits wound center Electronic Signature(s) Signed: 04/06/2017 12:09:02 PM By: Evlyn Kanner MD, FACS Previous Signature: 04/03/2017 4:01:09 PM Version By: Evlyn Kanner MD, FACS Entered By: Evlyn Kanner on 04/06/2017 08:46:19 TAKUMI, DIN ([id number]) -------------------------------------------------------------------------------- SuperBill Details Patient Name: [name] Date of Service: 04/03/2017 Medical Record Number: [id number] Patient Account Number: [id number] Date of Birth/Sex: 1955/09/03 (61 y.o. Male) Treating RN: Ashok Cordia, Debi Primary Care Provider: Everardo Pacific Other Clinician: Referring Provider: Everardo Pacific Treating Provider/Extender: Rudene Re in Treatment: 3 Diagnosis Coding ICD-10 Codes Code Description Q90.9 Down syndrome, unspecified L89.313 Pressure ulcer of right buttock, stage 3 L89.153 Pressure ulcer of sacral region, stage 3 E44.1 Mild protein-calorie malnutrition Facility Procedures CPT4 Code: [id number] Description: 11043 - DEB MUSC/FASCIA 20 SQ CM/< ICD-10 Description Diagnosis Q90.9 Down syndrome, unspecified L89.313 Pressure ulcer of right buttock, stage 3 L89.153 Pressure ulcer of sacral region, stage 3 E44.1 Mild protein-calorie malnutrition Modifier: Quantity: 1 Physician Procedures CPT4 Code: [id number] Description: 11043 - WC PHYS DEBR MUSCLE/FASCIA 20 SQ CM ICD-10 Description Diagnosis Q90.9 Down syndrome, unspecified L89.313 Pressure ulcer of right buttock, stage 3 L89.153 Pressure ulcer of sacral region, stage 3 E44.1 Mild protein-calorie  malnutrition Modifier: Quantity: 1 Electronic Signature(s) Signed: 04/03/2017 4:01:09 PM By: Evlyn Kanner MD, FACS Entered By: Evlyn Kanner on 04/03/2017 15:58:57

## 2017-04-10 ENCOUNTER — Inpatient Hospital Stay
Admission: EM | Admit: 2017-04-10 | Discharge: 2017-04-17 | DRG: 572 | Disposition: A | Discharge status: Skilled Nursing Facility | Payer: Medicare Other | Attending: Internal Medicine | Admitting: Internal Medicine

## 2017-04-10 ENCOUNTER — Encounter: Payer: Self-pay | Admitting: Emergency Medicine

## 2017-04-10 ENCOUNTER — Encounter: Payer: Medicare Other | Attending: Surgery | Admitting: Surgery

## 2017-04-10 ENCOUNTER — Emergency Department: Payer: Medicare Other

## 2017-04-10 DIAGNOSIS — Z79899 Other long term (current) drug therapy: Secondary | ICD-10-CM | POA: Insufficient documentation

## 2017-04-10 DIAGNOSIS — Q909 Down syndrome, unspecified: Secondary | ICD-10-CM

## 2017-04-10 DIAGNOSIS — L89154 Pressure ulcer of sacral region, stage 4: Secondary | ICD-10-CM | POA: Diagnosis not present

## 2017-04-10 DIAGNOSIS — M810 Age-related osteoporosis without current pathological fracture: Secondary | ICD-10-CM | POA: Insufficient documentation

## 2017-04-10 DIAGNOSIS — T148XXA Other injury of unspecified body region, initial encounter: Secondary | ICD-10-CM

## 2017-04-10 DIAGNOSIS — L89153 Pressure ulcer of sacral region, stage 3: Secondary | ICD-10-CM | POA: Insufficient documentation

## 2017-04-10 DIAGNOSIS — L89313 Pressure ulcer of right buttock, stage 3: Secondary | ICD-10-CM | POA: Insufficient documentation

## 2017-04-10 DIAGNOSIS — L089 Local infection of the skin and subcutaneous tissue, unspecified: Secondary | ICD-10-CM

## 2017-04-10 DIAGNOSIS — D649 Anemia, unspecified: Secondary | ICD-10-CM | POA: Insufficient documentation

## 2017-04-10 DIAGNOSIS — Z66 Do not resuscitate: Secondary | ICD-10-CM | POA: Diagnosis present

## 2017-04-10 DIAGNOSIS — K219 Gastro-esophageal reflux disease without esophagitis: Secondary | ICD-10-CM | POA: Diagnosis present

## 2017-04-10 DIAGNOSIS — F0390 Unspecified dementia without behavioral disturbance: Secondary | ICD-10-CM | POA: Diagnosis present

## 2017-04-10 DIAGNOSIS — E860 Dehydration: Secondary | ICD-10-CM | POA: Diagnosis present

## 2017-04-10 DIAGNOSIS — E441 Mild protein-calorie malnutrition: Secondary | ICD-10-CM | POA: Insufficient documentation

## 2017-04-10 LAB — CBC WITH DIFFERENTIAL/PLATELET
BASOS PCT: 2 %
Basophils Absolute: 0.1 10*3/uL (ref 0–0.1)
EOS ABS: 0.1 10*3/uL (ref 0–0.7)
EOS PCT: 1 %
HCT: 40.7 % (ref 40.0–52.0)
HEMOGLOBIN: 13.9 g/dL (ref 13.0–18.0)
LYMPHS ABS: 1 10*3/uL (ref 1.0–3.6)
Lymphocytes Relative: 12 %
MCH: 36 pg — AB (ref 26.0–34.0)
MCHC: 34.1 g/dL (ref 32.0–36.0)
MCV: 105.4 fL — ABNORMAL HIGH (ref 80.0–100.0)
MONOS PCT: 8 %
Monocytes Absolute: 0.7 10*3/uL (ref 0.2–1.0)
NEUTROS PCT: 77 %
Neutro Abs: 6.4 10*3/uL (ref 1.4–6.5)
PLATELETS: 364 10*3/uL (ref 150–440)
RBC: 3.86 MIL/uL — ABNORMAL LOW (ref 4.40–5.90)
RDW: 14.5 % (ref 11.5–14.5)
WBC: 8.2 10*3/uL (ref 3.8–10.6)

## 2017-04-10 LAB — COMPREHENSIVE METABOLIC PANEL
ALBUMIN: 2.9 g/dL — AB (ref 3.5–5.0)
ALT: 19 U/L (ref 17–63)
ANION GAP: 11 (ref 5–15)
AST: 31 U/L (ref 15–41)
Alkaline Phosphatase: 95 U/L (ref 38–126)
BUN: 20 mg/dL (ref 6–20)
CHLORIDE: 99 mmol/L — AB (ref 101–111)
CO2: 29 mmol/L (ref 22–32)
Calcium: 8.7 mg/dL — ABNORMAL LOW (ref 8.9–10.3)
Creatinine, Ser: 0.71 mg/dL (ref 0.61–1.24)
GFR calc non Af Amer: >60 mL/min (ref >60)
GLUCOSE: 102 mg/dL — AB (ref 65–99)
POTASSIUM: 3.9 mmol/L (ref 3.5–5.1)
SODIUM: 139 mmol/L (ref 135–145)
Total Bilirubin: 0.4 mg/dL (ref 0.3–1.2)
Total Protein: 6.5 g/dL (ref 6.5–8.1)

## 2017-04-10 LAB — TSH: TSH: 1.897 u[IU]/mL (ref 0.350–4.500)

## 2017-04-10 MED ORDER — LORATADINE 10 MG PO TABS
10.0000 mg | ORAL_TABLET | Freq: Every day | ORAL | Status: DC | PRN
  Filled 2017-04-10: qty 1

## 2017-04-10 MED ORDER — ACETAMINOPHEN 650 MG RE SUPP: 650.0000 mg | Freq: Four times a day (QID) | RECTAL | Status: DC | PRN

## 2017-04-10 MED ORDER — DEXTROMETHORPHAN POLISTIREX ER 30 MG/5ML PO SUER
30.0000 mg | Freq: Two times a day (BID) | ORAL | Status: DC
  Administered 2017-04-10 – 2017-04-17 (×12): 30 mg via ORAL
  Filled 2017-04-10 (×15): qty 5

## 2017-04-10 MED ORDER — VITAMIN D 1000 UNITS PO TABS
1000.0000 [IU] | ORAL_TABLET | Freq: Every day | ORAL | Status: DC
  Administered 2017-04-12 – 2017-04-17 (×5): 1000 [IU] via ORAL
  Filled 2017-04-10 (×6): qty 1

## 2017-04-10 MED ORDER — PIPERACILLIN-TAZOBACTAM 3.375 G IVPB 30 MIN
3.3750 g | Freq: Once | INTRAVENOUS | Status: AC
Start: 2017-04-10 — End: 2017-04-10
  Administered 2017-04-10: 3.375 g via INTRAVENOUS

## 2017-04-10 MED ORDER — SUCRALFATE 1 GM/10ML PO SUSP
1.0000 g | Freq: Three times a day (TID) | ORAL | Status: DC
  Administered 2017-04-10 – 2017-04-17 (×21): 1 g via ORAL
  Filled 2017-04-10 (×22): qty 10

## 2017-04-10 MED ORDER — SODIUM CHLORIDE 0.9 % IV BOLUS (SEPSIS)
500.0000 mL | INTRAVENOUS | Status: AC
  Administered 2017-04-10: 500 mL via INTRAVENOUS

## 2017-04-10 MED ORDER — PIPERACILLIN-TAZOBACTAM 3.375 G IVPB 30 MIN
INTRAVENOUS | Status: AC
  Administered 2017-04-10: 3.375 g via INTRAVENOUS
  Filled 2017-04-10: qty 50

## 2017-04-10 MED ORDER — CALCIUM CARBONATE ANTACID 500 MG PO CHEW
500.0000 mg | CHEWABLE_TABLET | Freq: Two times a day (BID) | ORAL | Status: DC
  Administered 2017-04-11 – 2017-04-17 (×9): 500 mg via ORAL
  Filled 2017-04-10 (×11): qty 3

## 2017-04-10 MED ORDER — LORAZEPAM 0.5 MG PO TABS
0.5000 mg | ORAL_TABLET | Freq: Every day | ORAL | Status: DC | PRN
  Administered 2017-04-11 – 2017-04-14 (×2): 0.5 mg via ORAL
  Filled 2017-04-10 (×3): qty 1

## 2017-04-10 MED ORDER — VANCOMYCIN HCL IN DEXTROSE 750-5 MG/150ML-% IV SOLN
750.0000 mg | Freq: Once | INTRAVENOUS | Status: AC
  Administered 2017-04-10: 750 mg via INTRAVENOUS
  Filled 2017-04-10: qty 150

## 2017-04-10 MED ORDER — POLYETHYLENE GLYCOL 3350 17 G PO PACK
17.0000 g | PACK | Freq: Every day | ORAL | Status: DC | PRN
Start: 2017-04-10 — End: 2017-04-17

## 2017-04-10 MED ORDER — NAFTIFINE HCL 1 % EX GEL
1.0000 "application " | Freq: Two times a day (BID) | CUTANEOUS | Status: DC | PRN
  Filled 2017-04-10: qty 40

## 2017-04-10 MED ORDER — NYSTATIN-TRIAMCINOLONE 100000-0.1 UNIT/GM-% EX CREA
1.0000 "application " | TOPICAL_CREAM | Freq: Two times a day (BID) | CUTANEOUS | Status: DC | PRN
  Filled 2017-04-10: qty 15

## 2017-04-10 MED ORDER — VITAMIN B-12 1000 MCG PO TABS
1000.0000 ug | ORAL_TABLET | Freq: Every day | ORAL | Status: DC
  Administered 2017-04-12 – 2017-04-17 (×5): 1000 ug via ORAL
  Filled 2017-04-10 (×6): qty 1

## 2017-04-10 MED ORDER — ADULT MULTIVITAMIN W/MINERALS CH
1.0000 | ORAL_TABLET | Freq: Every day | ORAL | Status: DC
  Administered 2017-04-12 – 2017-04-17 (×4): 1 via ORAL
  Filled 2017-04-10 (×5): qty 1

## 2017-04-10 MED ORDER — GUAIFENESIN ER 600 MG PO TB12
600.0000 mg | ORAL_TABLET | Freq: Two times a day (BID) | ORAL | Status: DC
  Administered 2017-04-11 – 2017-04-12 (×2): 600 mg via ORAL
  Filled 2017-04-10 (×3): qty 1

## 2017-04-10 MED ORDER — DOCUSATE SODIUM 100 MG PO CAPS
100.0000 mg | ORAL_CAPSULE | Freq: Two times a day (BID) | ORAL | Status: DC
  Administered 2017-04-11 – 2017-04-12 (×2): 100 mg via ORAL
  Filled 2017-04-10 (×3): qty 1

## 2017-04-10 MED ORDER — RISAQUAD PO CAPS
1.0000 | ORAL_CAPSULE | Freq: Every day | ORAL | Status: DC
  Administered 2017-04-12 – 2017-04-17 (×4): 1 via ORAL
  Filled 2017-04-10 (×5): qty 1

## 2017-04-10 MED ORDER — ACETAMINOPHEN 325 MG PO TABS
650.0000 mg | ORAL_TABLET | Freq: Four times a day (QID) | ORAL | Status: DC | PRN
  Administered 2017-04-16: 650 mg via ORAL
  Filled 2017-04-10: qty 2

## 2017-04-10 MED ORDER — SODIUM CHLORIDE 0.9 % IV SOLN
INTRAVENOUS | Status: DC
  Administered 2017-04-10: 22:00:00 via INTRAVENOUS
  Administered 2017-04-11: 1000 mL via INTRAVENOUS
  Administered 2017-04-12 – 2017-04-17 (×11): via INTRAVENOUS

## 2017-04-10 MED ORDER — SODIUM CHLORIDE 0.9 % IV BOLUS (SEPSIS): 500.0000 mL | Freq: Once | INTRAVENOUS | Status: DC

## 2017-04-10 MED ORDER — VANCOMYCIN HCL IN DEXTROSE 1-5 GM/200ML-% IV SOLN
INTRAVENOUS | Status: AC
  Filled 2017-04-10: qty 200

## 2017-04-10 MED ORDER — MIDAZOLAM HCL 5 MG/5ML IJ SOLN
0.5000 mg | Freq: Once | INTRAMUSCULAR | Status: AC
  Administered 2017-04-10: 0.5 mg via INTRAVENOUS
  Filled 2017-04-10: qty 5

## 2017-04-10 MED ORDER — PANTOPRAZOLE SODIUM 40 MG PO TBEC
40.0000 mg | DELAYED_RELEASE_TABLET | Freq: Every day | ORAL | Status: DC
  Administered 2017-04-12 – 2017-04-17 (×5): 40 mg via ORAL
  Filled 2017-04-10 (×6): qty 1

## 2017-04-10 MED ORDER — ARTIFICIAL TEARS OP OINT
TOPICAL_OINTMENT | Freq: Every day | OPHTHALMIC | Status: DC
  Administered 2017-04-10: via OPHTHALMIC
  Administered 2017-04-11: 1 via OPHTHALMIC
  Administered 2017-04-12 – 2017-04-16 (×5): via OPHTHALMIC
  Filled 2017-04-10 (×3): qty 3.5

## 2017-04-10 MED ORDER — MORPHINE SULFATE (PF) 2 MG/ML IV SOLN
1.0000 mg | INTRAVENOUS | Status: DC | PRN
  Administered 2017-04-11 – 2017-04-15 (×3): 1 mg via INTRAVENOUS
  Filled 2017-04-10 (×4): qty 1

## 2017-04-10 MED ORDER — MEMANTINE HCL 5 MG PO TABS
10.0000 mg | ORAL_TABLET | Freq: Two times a day (BID) | ORAL | Status: DC
  Administered 2017-04-10 – 2017-04-17 (×12): 10 mg via ORAL
  Filled 2017-04-10 (×12): qty 2

## 2017-04-10 MED ORDER — MELATONIN 5 MG PO TABS
5.0000 mg | ORAL_TABLET | Freq: Every day | ORAL | Status: DC
Start: 2017-04-10 — End: 2017-04-17
  Administered 2017-04-10 – 2017-04-16 (×4): 5 mg via ORAL
  Filled 2017-04-10 (×8): qty 1

## 2017-04-10 MED ORDER — ACETAMINOPHEN 500 MG PO TABS
1000.0000 mg | ORAL_TABLET | Freq: Three times a day (TID) | ORAL | Status: DC
  Administered 2017-04-10 – 2017-04-17 (×16): 1000 mg via ORAL
  Filled 2017-04-10 (×17): qty 2

## 2017-04-10 MED ORDER — ONDANSETRON HCL 4 MG PO TABS: 4.0000 mg | ORAL_TABLET | Freq: Four times a day (QID) | ORAL | Status: DC | PRN

## 2017-04-10 MED ORDER — SALINE SPRAY 0.65 % NA SOLN
1.0000 | NASAL | Status: DC | PRN
  Filled 2017-04-10: qty 44

## 2017-04-10 MED ORDER — ONDANSETRON HCL 4 MG/2ML IJ SOLN: 4.0000 mg | Freq: Four times a day (QID) | INTRAMUSCULAR | Status: DC | PRN

## 2017-04-10 MED ORDER — POLYVINYL ALCOHOL 1.4 % OP SOLN
1.0000 [drp] | OPHTHALMIC | Status: DC | PRN
  Filled 2017-04-10: qty 15

## 2017-04-10 NOTE — ED Provider Notes (Signed)
[facility] Emergency Department Provider Note  ____________________________________________   I have reviewed the triage vital signs and the nursing notes.   HISTORY  Chief Complaint Wound Infection    HPI Jesus Morton is a 61 y.o. male who has mr, trisomy and dementia presents today with a decub ulcer of several weeks duration, which, despite care from the wound center, is worsening and deepening and necrotic. he has no fevers, history is per caretaker and the wound care center which did personally call me to make [place] aware of the need for IV antibiotics and likely debridement. Patient at baseline otherwise cannot give a history because of mental status baseline.Level 5 chart caveat; no further history available due to patient status.       Past Medical History:  Diagnosis Date  . Anemia   . Dementia   . Down syndrome   . GERD (gastroesophageal reflux disease)   . Osteoporosis     Patient Active Problem List   Diagnosis Date Noted  . Macrocytosis 11/02/2016  . Dementia 07/19/2016  . Down syndrome 07/19/2016  . Foreign body alimentary tract 06/22/2016  . Abnormal findings on esophagogastroduodenoscopy (EGD) 06/22/2016  . Hemorrhagic esophagitis 06/22/2016  . Hypotension 06/22/2016  . Aspiration pneumonia ([place]) 06/22/2016  . Foreign body aspiration   . Cough   . Pneumonitis due to food and vomit ([place])   . Problems with swallowing and mastication   . Aspiration of foreign body   . Aspiration pneumonia of right lower lobe ([place]) 06/20/2016  . Cellulitis of right hand 12/24/2015  . Reflux esophagitis 03/10/2014  . Hematemesis 02/16/2014  . Inguinal hernia 12/18/2013  . Cellulitis of right arm 11/28/2013  . Multiple facial fractures ([place]) 03/05/2013  . GERD (gastroesophageal reflux disease) 01/28/2013  . Dysphagia 05/31/2012  . Cellulitis and abscess of leg 06/21/2011  . Corns and callosity 04/26/2011  . Onychomycosis due to dermatophyte  04/26/2011  . Osteoporosis 04/29/2010    Past Surgical History:  Procedure Laterality Date  . ESOPHAGOGASTRODUODENOSCOPY (EGD) WITH PROPOFOL N/A 06/21/2016   Procedure: ESOPHAGOGASTRODUODENOSCOPY (EGD) WITH PROPOFOL;  Surgeon: Lucilla Lame, MD;  Location: [facility] ENDOSCOPY;  Service: Endoscopy;  Laterality: N/A;  . HERNIA REPAIR      Prior to Admission medications   Medication Sig Start Date End Date Taking? Authorizing Provider  acetaminophen (TYLENOL) 500 MG tablet Take 500 mg by mouth every 6 (six) hours as needed.    [provider]  amoxicillin-clavulanate (AUGMENTIN ES-600) 600-42.9 MG/5ML suspension Take 5 mLs (600 mg total) by mouth 3 (three) times daily. Patient not taking: Reported on 07/19/2016 06/22/16   Theodoro Grist, MD  artificial tears (LACRILUBE) OINT ophthalmic ointment Place into both eyes at bedtime. 0.25 inch strip to both eyes at bedtime 06/22/16   Theodoro Grist, MD  calcium carbonate (OS-CAL) 600 MG tablet Take 600 mg by mouth twice daily. 05/13/16   [provider]  cholecalciferol (VITAMIN D) 1000 units tablet Take 1,000 Units by mouth daily.    [provider]  dextromethorphan (DELSYM) 30 MG/5ML liquid Take 5 mLs (30 mg total) by mouth 2 (two) times daily. Patient not taking: Reported on 07/19/2016 06/22/16   Theodoro Grist, MD  doxycycline (VIBRAMYCIN) 100 MG capsule Take 1 capsule (100 mg total) by mouth 2 (two) times daily. 03/27/17   Garguilo Pear, MD  guaiFENesin (MUCINEX) 600 MG 12 hr tablet Take 1 tablet (600 mg total) by mouth 2 (two) times daily. Patient not taking: Reported on 12/27/2016 06/22/16  Katharina Caper, MD  hydroxypropyl methylcellulose / hypromellose (ISOPTO TEARS / GONIOVISC) 2.5 % ophthalmic solution Place 1 drop into both eyes every 2 (two) hours while awake. 06/22/16   Katharina Caper, MD  loratadine (CLARITIN) 10 MG tablet Take 10 mg by mouth daily.    [provider]  LORazepam (ATIVAN) 0.5 MG tablet Take 1  tablet (0.5 mg total) by mouth daily as needed for anxiety. 06/22/16   Katharina Caper, MD  Melatonin 3 MG TABS Take 1 tablet by mouth daily.    [provider]  memantine (NAMENDA) 10 MG tablet Take 10 mg by mouth 2 (two) times daily.     [provider]  Multiple Vitamins-Minerals (MULTIVITAMIN ADULT) TABS Multivitamin Adult Oral Tablet QTY: 0 tablet Days: 0 Refills: 0  Written: 05/13/16 Patient Instructions:  05/13/16   [provider]  Naftifine HCl (NAFTIN) 1 % GEL Apply topically. Between toes    [provider]  nystatin-triamcinolone (MYCOLOG II) cream Apply 1 application topically 2 (two) times daily. Affected area of elbox flexor and groin    [provider]  pantoprazole (PROTONIX) 40 MG tablet Take 1 tablet (40 mg total) by mouth daily. 06/22/16   Katharina Caper, MD  Polyethylene Glycol 3350 (MIRALAX PO) MiraLax Oral Powder QTY: 0  Days: 0 Refills: 0  Written: 05/13/16 Patient Instructions: 17gm prn 05/13/16   [provider]  Probiotic Product (ALIGN PO) Take by mouth.    [provider]  sodium chloride (OCEAN) 0.65 % SOLN nasal spray Place 1 spray into both nostrils as needed for congestion.    [provider]  sucralfate (CARAFATE) 1 GM/10ML suspension Take 10 mLs (1 g total) by mouth 4 (four) times daily -  with meals and at bedtime. 07/19/16   Midge Minium, MD  vitamin B-12 (CYANOCOBALAMIN) 1000 MCG tablet Take 1,000 mcg by mouth daily.    [provider]    Allergies Patient has no known allergies.  Family History  Problem Relation Age of Onset  . Multiple myeloma Father   . Brain cancer Father   . Bone cancer Father   . Leukemia Sister   . Breast cancer Paternal Aunt   . Bladder Cancer Maternal Grandmother   . Leukemia Paternal Grandfather     Social History Social History  Substance Use Topics  . Smoking status: Never Smoker  . Smokeless tobacco: Never Used  . Alcohol use No    Review  of Systems Constitutional: No fever/chills Level 5 chart caveat; no further history available due to patient status.   ____________________________________________   PHYSICAL EXAM:  VITAL SIGNS: ED Triage Vitals  Enc Vitals Group     BP 04/10/17 1638 98/69     Pulse Rate 04/10/17 1638 68     Resp 04/10/17 1638 14     Temp 04/10/17 1638 97.8 F (36.6 C)     Temp Source 04/10/17 1638 Oral     SpO2 04/10/17 1638 100 %     Weight 04/10/17 1639 110 lb (49.9 kg)     Height 04/10/17 1639 5' (1.524 m)     Head Circumference --      Peak Flow --      Pain Score --      Pain Loc --      Pain Edu? --      Excl. in GC? --     Constitutional: Alert  in no acute distress. Eyes: Conjunctivae are normal Head: Atraumatic HEENT: No congestion/rhinnorhea. Mucous  membranes are moist.  Oropharynx non-erythematous Neck:   Nontender with no meningismus, no masses, no stridor Cardiovascular: Normal rate, regular rhythm. Grossly normal heart sounds.  Good peripheral circulation. Respiratory: Normal respiratory effort.  No retractions. Lungs CTAB. Abdominal: Soft and nontender. No distention. No guarding no rebound Back:  There is no focal tenderness or step off.  there is no midline tenderness there are no lesions noted. there is no CVA tenderness, there is a deep decubitus ulcer with mild surrounding erythema and necrotic tissue present to the sacrum. No fluctuance noted. Musculoskeletal: No lower extremity tenderness, no upper extremity tenderness. No joint effusions, no DVT signs strong distal pulses no edema  ______________   LABS (all labs ordered are listed, but only abnormal results are displayed)  Labs Reviewed  CBC WITH DIFFERENTIAL/PLATELET - Abnormal; Notable for the following:       Result Value   RBC 3.86 (*)    MCV 105.4 (*)    MCH 36.0 (*)    All other components within normal limits  COMPREHENSIVE METABOLIC PANEL - Abnormal; Notable for the following:    Chloride 99 (*)     Glucose, Bld 102 (*)    Calcium 8.7 (*)    Albumin 2.9 (*)    All other components within normal limits  AEROBIC CULTURE (SUPERFICIAL SPECIMEN)    Pertinent labs  results that were available during my care of the patient were reviewed by me and considered in my medical decision making (see chart for details). ____________________________________________  EKG  I personally interpreted any EKGs ordered by me or triage  ____________________________________________  RADIOLOGY  Pertinent labs & imaging results that were available during my care of the patient were reviewed by me and considered in my medical decision making (see chart for details). If possible, patient and/or family made aware of any abnormal findings. ____________________________________________    PROCEDURES  Procedure(s) performed: None  Procedures  Critical Care performed: None  ____________________________________________   INITIAL IMPRESSION / ASSESSMENT AND PLAN / ED COURSE  Pertinent labs & imaging results that were available during my care of the patient were reviewed by me and considered in my medical decision making (see chart for details).  patient with a deep decubitus ulcer, surgery from the wound center would like the patient on antibiotics and admitted for possible debridement, we'll get a sacral x-ray, start him on antibiotics. Hospitalist who would prefer not to have cultures performed which I think is unreasonable. I will see if I can get a wound culture. We will start the patient on antibiotics and admitted for further care    ____________________________________________   FINAL CLINICAL IMPRESSION(S) / ED DIAGNOSES  Final diagnoses:  Wound infection      This chart was dictated using voice recognition software.  Despite best efforts to proofread,  errors can occur which can change meaning.      Schuyler Amor, MD 04/10/17 [phone]

## 2017-04-10 NOTE — H&P (Signed)
Jesus Morton is an 61 y.o. male.   Chief Complaint: Wound HPI: The patient with past medical history of Down syndrome and dementia presents to the emergency department from wound clinic due to worsening decubitus ulcer. The patient reportedly has eschar as well as necrotic tissue of the wound. Wound was packed in clinic. The patient is afebrile and nontoxic appearing. He cannot contribute to his own history. His caregiver states that he is able to walk but is very anxious that he may fall and thus has spent a lot of time in bed. Emergency department staff gave the patient broad-spectrum IV antibiotics and obtained a wound culture prior to calling the hospitalist service for admission.  Past Medical History:  Diagnosis Date  . Anemia   . Dementia   . Down syndrome   . GERD (gastroesophageal reflux disease)   . Osteoporosis     Past Surgical History:  Procedure Laterality Date  . ESOPHAGOGASTRODUODENOSCOPY (EGD) WITH PROPOFOL N/A 06/21/2016   Procedure: ESOPHAGOGASTRODUODENOSCOPY (EGD) WITH PROPOFOL;  Surgeon: Lucilla Lame, MD;  Location: [facility] ENDOSCOPY;  Service: Endoscopy;  Laterality: N/A;  . HERNIA REPAIR      Family History  Problem Relation Age of Onset  . Multiple myeloma Father   . Brain cancer Father   . Bone cancer Father   . Leukemia Sister   . Breast cancer Paternal Aunt   . Bladder Cancer Maternal Grandmother   . Leukemia Paternal Grandfather    Social History:  reports that he has never smoked. He has never used smokeless tobacco. He reports that he does not drink alcohol or use drugs.  Allergies: No Known Allergies  Medications Prior to Admission  Medication Sig Dispense Refill  . acetaminophen (TYLENOL) 500 MG tablet Take 1,000 mg by mouth 3 (three) times daily.     [name] artificial tears (LACRILUBE) OINT ophthalmic ointment Place into both eyes at bedtime. 0.25 inch strip to both eyes at bedtime 1 Tube 6  . calcium carbonate (OS-CAL) 600 MG tablet Take 600 mg by  mouth twice daily.    . cholecalciferol (VITAMIN D) 1000 units tablet Take 1,000 Units by mouth daily.    . hydroxypropyl methylcellulose / hypromellose (ISOPTO TEARS / GONIOVISC) 2.5 % ophthalmic solution Place 1 drop into both eyes every 2 (two) hours while awake. 15 mL 12  . loratadine (CLARITIN) 10 MG tablet Take 10 mg by mouth daily as needed.     [name] LORazepam (ATIVAN) 0.5 MG tablet Take 1 tablet (0.5 mg total) by mouth daily as needed for anxiety. 30 tablet 0  . Melatonin 3 MG TABS Take 1 tablet by mouth daily.    . memantine (NAMENDA) 10 MG tablet Take 10 mg by mouth 2 (two) times daily.     . Multiple Vitamins-Minerals (MULTIVITAMIN ADULT) TABS Take one tablet by mouth once daily.    . Naftifine HCl (NAFTIN) 1 % GEL Apply 1 application topically 2 (two) times daily as needed. Between toes     . nystatin-triamcinolone (MYCOLOG II) cream Apply 1 application topically 2 (two) times daily. Affected area of elbox flexor and groin    . pantoprazole (PROTONIX) 40 MG tablet Take 1 tablet (40 mg total) by mouth daily. 30 tablet 5  . Polyethylene Glycol 3350 (MIRALAX PO) Mix 17g in 8 oz of water and drink daily as needed for constipation.    . Probiotic Product (ALIGN PO) Take 1 capsule by mouth daily.     . sodium chloride (OCEAN) 0.65 % SOLN  nasal spray Place 1 spray into both nostrils as needed for congestion.    . sucralfate (CARAFATE) 1 GM/10ML suspension Take 10 mLs (1 g total) by mouth 4 (four) times daily -  with meals and at bedtime. 420 mL 1  . vitamin B-12 (CYANOCOBALAMIN) 1000 MCG tablet Take 1,000 mcg by mouth daily.    [name] amoxicillin-clavulanate (AUGMENTIN ES-600) 600-42.9 MG/5ML suspension Take 5 mLs (600 mg total) by mouth 3 (three) times daily. (Patient not taking: Reported on 07/19/2016) 200 mL 0  . dextromethorphan (DELSYM) 30 MG/5ML liquid Take 5 mLs (30 mg total) by mouth 2 (two) times daily. (Patient not taking: Reported on 07/19/2016) 89 mL 0  . doxycycline (VIBRAMYCIN) 100 MG  capsule Take 1 capsule (100 mg total) by mouth 2 (two) times daily. (Patient not taking: Reported on 04/10/2017) 20 capsule 8  . guaiFENesin (MUCINEX) 600 MG 12 hr tablet Take 1 tablet (600 mg total) by mouth 2 (two) times daily. (Patient not taking: Reported on 12/27/2016) 20 tablet 0    Results for orders placed or performed during the hospital encounter of 04/10/17 (from the past 48 hour(s))  CBC with Differential     Status: Abnormal   Collection Time: 04/10/17  4:48 PM  Result Value Ref Range   WBC 8.2 3.8 - 10.6 K/uL   RBC 3.86 (L) 4.40 - 5.90 MIL/uL   Hemoglobin 13.9 13.0 - 18.0 g/dL   HCT 40.7 40.0 - 52.0 %   MCV 105.4 (H) 80.0 - 100.0 fL   MCH 36.0 (H) 26.0 - 34.0 pg   MCHC 34.1 32.0 - 36.0 g/dL   RDW 14.5 11.5 - 14.5 %   Platelets 364 150 - 440 K/uL   Neutrophils Relative % 77 %   Neutro Abs 6.4 1.4 - 6.5 K/uL   Lymphocytes Relative 12 %   Lymphs Abs 1.0 1.0 - 3.6 K/uL   Monocytes Relative 8 %   Monocytes Absolute 0.7 0.2 - 1.0 K/uL   Eosinophils Relative 1 %   Eosinophils Absolute 0.1 0 - 0.7 K/uL   Basophils Relative 2 %   Basophils Absolute 0.1 0 - 0.1 K/uL  Comprehensive metabolic panel     Status: Abnormal   Collection Time: 04/10/17  4:48 PM  Result Value Ref Range   Sodium 139 135 - 145 mmol/L   Potassium 3.9 3.5 - 5.1 mmol/L   Chloride 99 (L) 101 - 111 mmol/L   CO2 29 22 - 32 mmol/L   Glucose, Bld 102 (H) 65 - 99 mg/dL   BUN 20 6 - 20 mg/dL   Creatinine, Ser 0.71 0.61 - 1.24 mg/dL   Calcium 8.7 (L) 8.9 - 10.3 mg/dL   Total Protein 6.5 6.5 - 8.1 g/dL   Albumin 2.9 (L) 3.5 - 5.0 g/dL   AST 31 15 - 41 U/L   ALT 19 17 - 63 U/L   Alkaline Phosphatase 95 38 - 126 U/L   Total Bilirubin 0.4 0.3 - 1.2 mg/dL   GFR calc non Af Amer >60 >60 mL/min   GFR calc Af Amer >60 >60 mL/min    Comment: (NOTE) The eGFR has been calculated using the CKD EPI equation. This calculation has not been validated in all clinical situations. eGFR's persistently <60 mL/min signify  possible Chronic Kidney Disease.    Anion gap 11 5 - 15   Dg Sacrum/coccyx  Result Date: 04/10/2017 CLINICAL DATA:  Sacral wound. EXAM: SACRUM AND COCCYX - 2+ VIEW COMPARISON:  10/01/2016 FINDINGS:  Diffuse osteopenia. Mild symmetric degenerative change of the hips unchanged. Mild degenerate change of the symphysis pubis joint. Mild degenerate change of the spine. No definite air in the soft tissues. Mild fecal retention over the rectosigmoid colon. IMPRESSION: No acute findings. Degenerative changes as described. Electronically Signed   By: Marin Olp M.D.   On: 04/10/2017 19:24    Review of Systems  Unable to perform ROS: Dementia    Blood pressure 137/69, pulse 73, temperature (!) 97.5 F (36.4 C), temperature source Oral, resp. rate 20, height 5' (1.524 m), weight 49.9 kg (110 lb), SpO2 (!) 55 %. Physical Exam  Vitals reviewed. Constitutional: He appears well-developed and well-nourished. No distress.  HENT:  Head: Normocephalic and atraumatic.  Mouth/Throat: Oropharynx is clear and moist. Mucous membranes are dry.  Eyes: Pupils are equal, round, and reactive to light. Conjunctivae and EOM are normal. No scleral icterus.  Neck: Normal range of motion. Neck supple. No JVD present. No tracheal deviation present. No thyromegaly present.  Cardiovascular: Normal rate, regular rhythm and normal heart sounds.  Exam reveals no gallop and no friction rub.   No murmur heard. Respiratory: Effort normal and breath sounds normal. No respiratory distress.  GI: Soft. Bowel sounds are normal. He exhibits no distension. There is no tenderness.  Genitourinary:  Genitourinary Comments: Deferred  Musculoskeletal: Normal range of motion. He exhibits no edema.  Lymphadenopathy:    He has no cervical adenopathy.  Neurological: He is alert. No cranial nerve deficit.  The patient has not consistently verbal thus it is difficult to assess orientation  Skin: Skin is warm and dry. No rash noted. No  erythema.  Decubitus ulcer sacrum packed  Psychiatric: He has a normal mood and affect. His behavior is normal. Judgment and thought content normal.     Assessment/Plan This is a 61 year old male admitted for sacral decubitus ulcer. 1. Decubitus ulcer: Reportedly stage IV; surgeries been consulted for debridement. The patient does not meet criteria for sepsis. I have not continued antibiotics at this time. Wound culture pending. Assessment hemoglobin A1c for wound healing. 2. Dehydration: Clinically; dry mucous membranes. Hydrate with intravenous fluid. Oral care and eye care per nursing. 3. GERD: The patient has history of aspiration pneumonia. He is on a pured diet. Continue PPI. 4. Dementia: Superimposed upon Down syndrome. Continue Namenda. Ativan for agitation. Check TSH. 5. DVT prophylaxis: SCDs 6. GI prophylaxis: As above The patient is a DO NOT INTUBATE. Time spent on admission orders and patient care approximately 45 minutes  Harrie Foreman, MD 04/10/2017, 10:11 PM

## 2017-04-10 NOTE — ED Triage Notes (Signed)
Was seen through wound center today and was sent to ED for admission for probable surgical debridement of sacral wound.

## 2017-04-11 DIAGNOSIS — L89154 Pressure ulcer of sacral region, stage 4: Secondary | ICD-10-CM | POA: Diagnosis not present

## 2017-04-11 LAB — HEMOGLOBIN A1C
Hgb A1c MFr Bld: 4.9 % (ref 4.8–5.6)
Mean Plasma Glucose: 93.93 mg/dL

## 2017-04-11 LAB — MRSA PCR SCREENING: MRSA by PCR: NEGATIVE

## 2017-04-11 NOTE — Clinical Social Work Note (Signed)
Clinical Social Work Assessment  Patient Details  Name: Jesus Morton MRN: [id number] Date of Birth: 1955-07-21  Date of referral:  04/11/17               Reason for consult:  Discharge Planning                Permission sought to share information with:    Permission granted to share information::     Name::        Agency::     Relationship::     Contact Information:     Housing/Transportation Living arrangements for the past 2 months:  Group Home Source of Information:  Facility Patient Interpreter Needed:  None Criminal Activity/Legal Involvement Pertinent to Current Situation/Hospitalization:  No - Comment as needed Significant Relationships:  Siblings Lives with:  Facility Resident Do you feel safe going back to the place where you live?    Need for family participation in patient care:  Yes (Comment)  Care giving concerns:  Patient unable to care for himself.   Social Worker assessment / plan:  CSW aware that patient is from [employer]. He has a decubitus that has worsened and thus is the reason he has come to the hospital. CSW spoke with Anselm Pancoast nurse evaluator, Lupita Leash: [phone].  Lupita Leash stated that she is more than likely unable to accept patient back. She stated that his wound has worsened and that she is not able to do daily dressing changes. Lupita Leash also stated she will be in and out of the office and if she does not answer her phone, then please call Terrance: Care Manager at [employer]: [phone]. CSW has asked RN CM to see if the wound care nurse could be consulted and possibly consider LTAC level of care due to IV ABX and wound care needs. Other alternative will be to place patient in a nursing home. Patient will require a level 2 pasrr and CSW will begin pasrr process as it may take several days. CSW will speak with patient's sister as well to discuss discharge plan.  Employment status:  Disabled (Comment on whether or not currently receiving  Disability) Insurance information:  Medicare, Medicaid In [place] PT Recommendations:    Information / Referral to community resources:     Patient/Family's Response to care:  Lupita Leash expressed appreciation for CSW phone call.  Patient/Family's Understanding of and Emotional Response to Diagnosis, Current Treatment, and Prognosis:  Patient is unable to comprehend his health issues and relies on caregivers.  Emotional Assessment Appearance:  Appears stated age Attitude/Demeanor/Rapport:  Unable to Assess Affect (typically observed):  Unable to Assess Orientation:    Alcohol / Substance use:  Not Applicable Psych involvement (Current and /or in the community):  No (Comment)  Discharge Needs  Concerns to be addressed:  Care Coordination Readmission within the last 30 days:  No Current discharge risk:  None Barriers to Discharge:  Continued Medical Work up   [employer], LCSW 04/11/2017, 2:03 PM

## 2017-04-11 NOTE — Consult Note (Signed)
Surgical Consultation  04/11/2017  Erwin Nishiyama is an 61 y.o. male.   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 The caregiver is present but is not POA. No history can be obtained except from the chart and review of systems is not possible.  Past Medical History:  Diagnosis Date  . Anemia   . Dementia   . Down syndrome   . GERD (gastroesophageal reflux disease)   . Osteoporosis     Past Surgical History:  Procedure Laterality Date  . ESOPHAGOGASTRODUODENOSCOPY (EGD) WITH PROPOFOL N/A 06/21/2016   Procedure: ESOPHAGOGASTRODUODENOSCOPY (EGD) WITH PROPOFOL;  Surgeon: Lucilla Lame, MD;  Location: [facility] ENDOSCOPY;  Service: Endoscopy;  Laterality: N/A;  . HERNIA REPAIR      Family History  Problem Relation Age of Onset  . Multiple myeloma Father   . Brain cancer Father   . Bone cancer Father   . Leukemia Sister   . Breast cancer Paternal Aunt   . Bladder Cancer Maternal Grandmother   . Leukemia Paternal Grandfather     Social History:  reports that he has never smoked. He has never used smokeless tobacco. He reports that he does not drink alcohol or use drugs.  Allergies: No Known Allergies  Medications reviewed.   Review of Systems:   Review of Systems  Unable to perform ROS: Dementia     Physical Exam:  BP 127/65 (BP Location: Right Arm)   Pulse 80   Temp 98.2 F (36.8 C) (Oral)   Resp 18   Ht 5' (1.524 m)   Wt 110 lb (49.9 kg)   SpO2 100%   BMI 21.48 kg/m   Physical Exam  Constitutional:  Cachectic Downs patient with contractures and emaciation with muscle wasting  HENT:  Counts patient with nontraumatic cranium  Eyes: Right eye exhibits no discharge. Left eye exhibits no discharge. No scleral icterus.  Neck: No tracheal deviation present.  Contractures  Cardiovascular: Normal rate and regular rhythm.   Pulmonary/Chest: Effort normal. No  respiratory distress.  Nasal cannula oxygen  Abdominal: Soft. He exhibits no distension. There is no tenderness.  Musculoskeletal: Normal range of motion. He exhibits no edema or tenderness.  Multiple deformities of the feet and legs with contractures  Neurological:  Noncommunicative except for cries out periodically.  Skin: Skin is warm and dry. No rash noted. No erythema.  Venous stasis changes lower extremity with hyperpigmentation  Vitals reviewed.     Results for orders placed or performed during the hospital encounter of 04/10/17 (from the past 48 hour(s))  CBC with Differential     Status: Abnormal   Collection Time: 04/10/17  4:48 PM  Result Value Ref Range   WBC 8.2 3.8 - 10.6 K/uL   RBC 3.86 (L) 4.40 - 5.90 MIL/uL   Hemoglobin 13.9 13.0 - 18.0 g/dL   HCT 40.7 40.0 - 52.0 %   MCV 105.4 (H) 80.0 - 100.0 fL   MCH 36.0 (H) 26.0 - 34.0 pg   MCHC 34.1 32.0 - 36.0 g/dL   RDW 14.5 11.5 - 14.5 %   Platelets 364 150 - 440 K/uL   Neutrophils Relative % 77 %   Neutro Abs 6.4 1.4 - 6.5 K/uL   Lymphocytes Relative 12 %   Lymphs Abs 1.0 1.0 - 3.6 K/uL   Monocytes Relative 8 %   Monocytes Absolute 0.7 0.2 - 1.0 K/uL   Eosinophils Relative 1 %  Eosinophils Absolute 0.1 0 - 0.7 K/uL   Basophils Relative 2 %   Basophils Absolute 0.1 0 - 0.1 K/uL  Comprehensive metabolic panel     Status: Abnormal   Collection Time: 04/10/17  4:48 PM  Result Value Ref Range   Sodium 139 135 - 145 mmol/L   Potassium 3.9 3.5 - 5.1 mmol/L   Chloride 99 (L) 101 - 111 mmol/L   CO2 29 22 - 32 mmol/L   Glucose, Bld 102 (H) 65 - 99 mg/dL   BUN 20 6 - 20 mg/dL   Creatinine, Ser 0.71 0.61 - 1.24 mg/dL   Calcium 8.7 (L) 8.9 - 10.3 mg/dL   Total Protein 6.5 6.5 - 8.1 g/dL   Albumin 2.9 (L) 3.5 - 5.0 g/dL   AST 31 15 - 41 U/L   ALT 19 17 - 63 U/L   Alkaline Phosphatase 95 38 - 126 U/L   Total Bilirubin 0.4 0.3 - 1.2 mg/dL   GFR calc non Af Amer >60 >60 mL/min   GFR calc Af Amer >60 >60 mL/min     Comment: (NOTE) The eGFR has been calculated using the CKD EPI equation. This calculation has not been validated in all clinical situations. eGFR's persistently <60 mL/min signify possible Chronic Kidney Disease.    Anion gap 11 5 - 15  TSH     Status: None   Collection Time: 04/10/17  4:48 PM  Result Value Ref Range   TSH 1.897 0.350 - 4.500 uIU/mL    Comment: Performed by a 3rd Generation assay with a functional sensitivity of <=0.01 uIU/mL.  Hemoglobin A1c     Status: None   Collection Time: 04/10/17  4:48 PM  Result Value Ref Range   Hgb A1c MFr Bld 4.9 4.8 - 5.6 %    Comment: (NOTE) Pre diabetes:          5.7%-6.4% Diabetes:              >6.4% Glycemic control for   <7.0% adults with diabetes    Mean Plasma Glucose 93.93 mg/dL    Comment: Performed at [facility] [address], [place], [place] [address]  Wound or Superficial Culture     Status: None (Preliminary result)   Collection Time: 04/10/17  7:00 PM  Result Value Ref Range   Specimen Description ARM    Special Requests NONE    Gram Stain      FEW WBC PRESENT, PREDOMINANTLY PMN MODERATE GRAM POSITIVE COCCI FEW GRAM POSITIVE RODS FEW GRAM NEGATIVE RODS Performed at [facility], [place] [address], [place], [place] [address]    Culture PENDING    Report Status PENDING   MRSA PCR Screening     Status: None   Collection Time: 04/11/17  5:08 AM  Result Value Ref Range   MRSA by PCR NEGATIVE NEGATIVE    Comment:        The GeneXpert MRSA Assay (FDA approved for NASAL specimens only), is one component of a comprehensive MRSA colonization surveillance program. It is not intended to diagnose MRSA infection nor to guide or monitor treatment for MRSA infections.    Dg Sacrum/coccyx  Result Date: 04/10/2017 CLINICAL DATA:  Sacral wound. EXAM: SACRUM AND COCCYX - 2+ VIEW COMPARISON:  10/01/2016 FINDINGS: Diffuse osteopenia. Mild symmetric degenerative change of the hips unchanged. Mild degenerate  change of the symphysis pubis joint. Mild degenerate change of the spine. No definite air in the soft tissues. Mild fecal  retention over the rectosigmoid colon. IMPRESSION: No acute findings. Degenerative changes as described. Electronically Signed   By: Marin Olp M.D.   On: 04/10/2017 19:24    Assessment/Plan:  This patient referred over by wound center for decubitus ulcer for debridement. No POA is available at this time I will attempt to call the POA and discuss options with them I did discuss this with the caregiver that continued Dakin solution wet-to-dry would only result in extremely slow improvement. Debridement would assist in this but this would not likely result in healing of the wound. The only way to heal this would be with advanced plastic surgical techniques. I will discuss this with the POA once available.   I also discussed the care with social services who stated that it is unlikely that he will be of return to his group home and that an [facility] may be in his future.  Florene Glen, MD, FACS

## 2017-04-11 NOTE — Progress Notes (Signed)
[facility]   PATIENT NAME: Jesus Morton    MR#:  [id number]  DATE OF BIRTH:  Nov 14, 1955  SUBJECTIVE:  CHIEF COMPLAINT:   Chief Complaint  Patient presents with  . Wound Infection      Sent from his group home due to wound on his scrotum. Pt have downs and dementia, so not able to give details or complains, sister is in room.  REVIEW OF SYSTEMS:   Pt is not able to give ROS.  ROS  DRUG ALLERGIES:  No Known Allergies  VITALS:  Blood pressure 127/65, pulse 80, temperature 98.2 F (36.8 C), temperature source Oral, resp. rate 18, height 5' (1.524 m), weight 49.9 kg (110 lb), SpO2 100 %.  PHYSICAL EXAMINATION:   Constitutional: He appears well-developed and well-nourished. No distress.  HENT:  Head: Normocephalic and atraumatic.  Mouth/Throat: Oropharynx is clear and moist. Mucous membranes are dry.  Eyes: Pupils are equal, round, and reactive to light. Conjunctivae and EOM are normal. No scleral icterus.  Neck: Normal range of motion. Neck supple. No JVD present. No tracheal deviation present. No thyromegaly present.  Cardiovascular: Normal rate, regular rhythm and normal heart sounds.  Exam reveals no gallop and no friction rub.   No murmur heard. Respiratory: Effort normal and breath sounds normal. No respiratory distress.  GI: Soft. Bowel sounds are normal. He exhibits no distension. There is no tenderness.  Genitourinary:  Genitourinary Comments: Deferred  Musculoskeletal: Normal range of motion. He exhibits no edema.  Lymphadenopathy:    He has no cervical adenopathy.  Neurological: He is alert. No cranial nerve deficit.  The patient has not consistently verbal thus it is difficult to assess orientation  Skin: Skin is warm and dry. No rash noted. No erythema.  Decubitus ulcer sacrum packed  Psychiatric: He has a normal mood and affect. His behavior is normal. Judgment and thought content normal.   Physical Exam LABORATORY  PANEL:   CBC  Recent Labs Lab 04/10/17 1648  WBC 8.2  HGB 13.9  HCT 40.7  PLT 364   ------------------------------------------------------------------------------------------------------------------  Chemistries   Recent Labs Lab 04/10/17 1648  NA 139  K 3.9  CL 99*  CO2 29  GLUCOSE 102*  BUN 20  CREATININE 0.71  CALCIUM 8.7*  AST 31  ALT 19  ALKPHOS 95  BILITOT 0.4   ------------------------------------------------------------------------------------------------------------------  Cardiac Enzymes No results for input(s): TROPONINI in the last 168 hours. ------------------------------------------------------------------------------------------------------------------  RADIOLOGY:  Dg Sacrum/coccyx  Result Date: 04/10/2017 CLINICAL DATA:  Sacral wound. EXAM: SACRUM AND COCCYX - 2+ VIEW COMPARISON:  10/01/2016 FINDINGS: Diffuse osteopenia. Mild symmetric degenerative change of the hips unchanged. Mild degenerate change of the symphysis pubis joint. Mild degenerate change of the spine. No definite air in the soft tissues. Mild fecal retention over the rectosigmoid colon. IMPRESSION: No acute findings. Degenerative changes as described. Electronically Signed   By: Elberta Fortis M.D.   On: 04/10/2017 19:24    ASSESSMENT AND PLAN:   Active Problems:   Decubitus ulcer of sacral region, stage 4 (HCC)  1. Decubitus ulcer: Reportedly stage IV; surgeries been consulted for debridement. The patient does not meet criteria for sepsis. No need for antibiotics at this time. Wound culture pending. Assessment hemoglobin A1c for wound healing. Surgery consult. 2. Dehydration: Clinically; dry mucous membranes. Hydrate with intravenous fluid. Oral care and eye care per nursing. 3. GERD: The patient has history of aspiration pneumonia. He is on a pured diet. Continue PPI. 4.  Dementia: Superimposed upon Down syndrome. Continue Namenda. Ativan for agitation. Check TSH. 5. DVT  prophylaxis: SCDs 6. GI prophylaxis: As above   All the records are reviewed and case discussed with Care Management/Social Workerr. Management plans discussed with the patient, family and they are in agreement.  CODE STATUS: partial.  TOTAL TIME TAKING CARE OF THIS PATIENT: 35 minutes.   POSSIBLE D/C IN 1-2 DAYS, DEPENDING ON CLINICAL CONDITION.   Altamese Dilling M.D on 04/11/2017   Between 7am to 6pm - Pager - [phone]  After 6pm go to [email or web address] - password [email or web address]  [facility]  Office  [phone]  CC: Primary care physician; McLean-Scocuzza, Pasty Spillers, MD  Note: This dictation was prepared with Dragon dictation along with smaller phrase technology. Any transcriptional errors that result from this process are unintentional.

## 2017-04-11 NOTE — Progress Notes (Signed)
TREY, GULBRANSON ([id number]) Visit Report for 04/10/2017 Chief Complaint Document Details Patient Name: Jesus Morton, Jesus Morton 04/10/2017 3:15 Date of Service: PM Medical Record [id number] Number: Patient Account Number: [id number] Date of Birth/Sex: [birth year] (61 y.o. Male) Treating RN: Ermalinda Barrios, Other Clinician: Primary Care Provider: French Ana Treating Ellison Leisure, Verdia Kuba, Provider/Extender: Referring Provider: Delmer Islam in Treatment: 4 Information Obtained from: Patient Chief Complaint Patient is at the clinic for treatment of an open pressure ulcer to the right medial gluteal and sacral area which she's had for about 3 weeks Electronic Signature(s) Signed: 04/10/2017 4:18:52 PM By: Evlyn Kanner MD, FACS Entered By: Evlyn Kanner on 04/10/2017 16:18:52 GRIFF, BADLEY ([id number]) -------------------------------------------------------------------------------- HPI Details Patient Name: Jesus Morton 04/10/2017 3:15 Date of Service: PM Medical Record [id number] Number: Patient Account Number: [id number] Date of Birth/Sex: 1956-02-25 (61 y.o. Male) Treating RN: Ermalinda Barrios, Other Clinician: Primary Care Provider: French Ana Treating Evann Koelzer, Verdia Kuba, Provider/Extender: Referring Provider: Delmer Islam in Treatment: 4 History of Present Illness Location: right gluteal and sacral area Quality: Patient reports experiencing a dull pain to affected area(s). Severity: Patient states wound are getting worse. Duration: Patient has had the wound for < 3 weeks prior to presenting for treatment Timing: Pain in wound is Intermittent (comes and goes Context: The wound would happen gradually Modifying Factors: Other treatment(s) tried include:oral antibiotics and local care Associated Signs and Symptoms: Patient reports presence of swelling HPI Description: 60 year old male with Down syndrome and dementia has been referred to [place]  for a sacral decubitus ulcer. Past medical history significant for GERD, dementia, Down syndrome, status post hiatal hernia repair in June 2015, repair of incarcerated direct inguinal hernia in August 2015, colonoscopy and EGD in the past. On a recent examination his PCP Dr. Richardson Chiquito had found a stage III pressure ulcer of the sacrum and referred him to the wound clinic. 03/23/2017 -- they're trying their best to offload him but he continues to have problems doing this and has developed a deep tissue injury to the right of his original wound between the 6 and 12:00 position for about 2 cm from the wound edge 04/03/2017 -- patient's sister Marylu Lund is at the bedside and we have discussed the need to completely offload and I have recommended that he does not go to the daycare center where he sitting on a wheelchair for most of the time. He will be better off on bed with change of position appropriately. 04/10/2017 -- the patient's sister and his Aides from the home, finding it extremely difficult to offload him and he has not been able to take pressure off his sacral region. Electronic Signature(s) Signed: 04/10/2017 4:19:51 PM By: Evlyn Kanner MD, FACS Entered By: Evlyn Kanner on 04/10/2017 16:19:51 CHANTRY, HEADEN ([id number]) -------------------------------------------------------------------------------- Physical Exam Details Patient Name: Jesus Morton 04/10/2017 3:15 Date of Service: PM Medical Record [id number] Number: Patient Account Number: [id number] Date of Birth/Sex: [birth year] (61 y.o. Male) Treating RN: Ermalinda Barrios, Other Clinician: Primary Care Provider: French Ana Treating Breylan Lefevers, Verdia Kuba, Provider/Extender: Referring Provider: Delmer Islam in Treatment: 4 Constitutional . Pulse regular. Respirations normal and unlabored. Afebrile. . Eyes Nonicteric. Reactive to light. Ears, Nose, Mouth, and Throat Lips, teeth, and gums WNL.[name] Moist mucosa  without lesions. Neck supple and nontender. No palpable supraclavicular or cervical adenopathy. Normal sized without goiter. Respiratory WNL. No retractions.. Cardiovascular Pedal Pulses WNL. No clubbing, cyanosis or edema. Lymphatic No adneopathy. No adenopathy. No adenopathy. Musculoskeletal Adexa without tenderness or enlargement.. Digits and nails w/o clubbing, cyanosis, infection, petechiae, ischemia, or  inflammatory conditions.. Integumentary (Hair, Skin) No suspicious lesions. No crepitus or fluctuance. No peri-wound warmth or erythema. No masses.[name] Psychiatric Judgement and insight Intact.. No evidence of depression, anxiety, or agitation.. Notes the wound is now progressively gotten much worse and there is a lot of subcutaneous debris and today I was unable to remove any move sharply as it is extremely tender. There is a lot of undermining between the 9 and 3:00 position. This is beyond the scope of outpatient debridement and he will need debridement under anesthesia. Electronic Signature(s) Signed: 04/10/2017 4:20:57 PM By: Evlyn Kanner MD, FACS Entered By: Evlyn Kanner on 04/10/2017 16:20:56 ROEY, COOPMAN ([id number]) -------------------------------------------------------------------------------- Physician Orders Details Patient Name: Jesus Morton 04/10/2017 3:15 Date of Service: PM Medical Record [id number] Number: Patient Account Number: [id number] Date of Birth/Sex: 1956-06-16 (61 y.o. Male) Treating RN: Ermalinda Barrios, Other Clinician: Primary Care Provider: French Ana Treating Aissata Wilmore, Verdia Kuba, Provider/Extender: Referring Provider: Delmer Islam in Treatment: 4 Verbal / Phone Orders: Yes Clinician: Ashok Cordia, Debi Read Back and Verified: Yes Diagnosis Coding Wound Cleansing Wound #1 Right Sacrum o Clean wound with Normal Saline. o Cleanse wound with mild soap and water o May Shower, gently pat wound dry prior to  applying new dressing. - Pt may shower every other day before dressing changes unless soiled. Wound #2 Left,Dorsal Forearm o Clean wound with Normal Saline. o Cleanse wound with mild soap and water o May Shower, gently pat wound dry prior to applying new dressing. - Pt may shower every other day before dressing changes unless soiled. Anesthetic Wound #1 Right Sacrum o Topical Lidocaine 4% cream applied to wound bed prior to debridement - for clinic use Wound #2 Left,Dorsal Forearm o Topical Lidocaine 4% cream applied to wound bed prior to debridement - for clinic use Skin Barriers/Peri-Wound Care Wound #1 Right Sacrum o Skin Prep Primary Wound Dressing Wound #1 Right Sacrum o Santyl Ointment Secondary Dressing Wound #1 Right Sacrum o ABD pad o Dry Gauze o Conform/Kerlix - lightly pack in wound with the [employer], Kyrel ([id number]) Wound #2 Left,Dorsal Forearm o Boardered Foam Dressing Dressing Change Frequency Wound #1 Right Sacrum o Change dressing every day. o Other: - change PRN as soiled Wound #2 Left,Dorsal Forearm o Change dressing every other day. Follow-up Appointments Wound #1 Right Sacrum o Return Appointment in 1 week. o Other: - depending on hospital admission Wound #2 Left,Dorsal Forearm o Return Appointment in 1 week. o Other: - depending on hospital admission Off-Loading Wound #1 Right Sacrum o Turn and reposition every 2 hours - PLEASE REPOSITION EVERY 2 HOURS AND AS NEEDED o Other: - Pt needs to take off time from [facility] until further notice because pt needs to not to be sitting in the wheelchair for long periods at a time and needs bed rest for wound healing. Wound #2 Left,Dorsal Forearm o Turn and reposition every 2 hours - PLEASE REPOSITION EVERY 2 HOURS AND AS NEEDED o Other: - Pt needs to take off time from [facility] until further notice because pt needs to not to be sitting in the wheelchair  for long periods at a time and needs bed rest for wound healing. Additional Orders / Instructions Wound #1 Right Sacrum o Increase protein intake. o Other: - Go straight to the ER. Wound #2 Left,Dorsal Forearm o Increase protein intake. o Other: - Go straight to the ER. Medications-please add to medication list. Wound #1 Right Sacrum o Other: - Take Vitamin A, Vitamin C, Zinc, Multivitamin Wound #  2 Left,Dorsal Forearm o Other: - Take Vitamin A, Vitamin C, Zinc, Multivitamin Gaspard, Kaspar ([id number]) Electronic Signature(s) Signed: 04/10/2017 4:37:28 PM By: Evlyn Kanner MD, FACS Signed: 04/10/2017 5:16:33 PM By: Alejandro Mulling Entered By: Alejandro Mulling on 04/10/2017 16:19:33 TYRIQUE, SPORN ([id number]) -------------------------------------------------------------------------------- Problem List Details Patient Name: KAZUTO, SEVEY 04/10/2017 3:15 Date of Service: PM Medical Record [id number] Number: Patient Account Number: [id number] Date of Birth/Sex: 1956-06-19 (61 y.o. Male) Treating RN: Ermalinda Barrios, Other Clinician: Primary Care Provider: French Ana Treating Xzandria Clevinger, Verdia Kuba, Provider/Extender: Referring Provider: Delmer Islam in Treatment: 4 Active Problems ICD-10 Encounter Code Description Active Date Diagnosis Q90.9 Down syndrome, unspecified 03/09/2017 Yes L89.313 Pressure ulcer of right buttock, stage 3 03/09/2017 Yes L89.153 Pressure ulcer of sacral region, stage 3 03/09/2017 Yes E44.1 Mild protein-calorie malnutrition 03/09/2017 Yes Inactive Problems Resolved Problems Electronic Signature(s) Signed: 04/10/2017 4:18:41 PM By: Evlyn Kanner MD, FACS Entered By: Evlyn Kanner on 04/10/2017 16:18:41 [name] ([id number]) -------------------------------------------------------------------------------- Progress Note Details Patient Name: UCHENNA, RAPPAPORT 04/10/2017 3:15 Date of Service: PM Medical  Record [id number] Number: Patient Account Number: [id number] Date of Birth/Sex: 1956/01/07 (61 y.o. Male) Treating RN: Ermalinda Barrios, Other Clinician: Primary Care Provider: French Ana Treating Chasady Longwell, Verdia Kuba, Provider/Extender: Referring Provider: Delmer Islam in Treatment: 4 Subjective Chief Complaint Information obtained from Patient Patient is at the clinic for treatment of an open pressure ulcer to the right medial gluteal and sacral area which she's had for about 3 weeks History of Present Illness (HPI) The following HPI elements were documented for the patient's wound: Location: right gluteal and sacral area Quality: Patient reports experiencing a dull pain to affected area(s). Severity: Patient states wound are getting worse. Duration: Patient has had the wound for < 3 weeks prior to presenting for treatment Timing: Pain in wound is Intermittent (comes and goes Context: The wound would happen gradually Modifying Factors: Other treatment(s) tried include:oral antibiotics and local care Associated Signs and Symptoms: Patient reports presence of swelling 60 year old male with Down syndrome and dementia has been referred to [place] for a sacral decubitus ulcer. Past medical history significant for GERD, dementia, Down syndrome, status post hiatal hernia repair in June 2015, repair of incarcerated direct inguinal hernia in August 2015, colonoscopy and EGD in the past. On a recent examination his PCP Dr. Richardson Chiquito had found a stage III pressure ulcer of the sacrum and referred him to the wound clinic. 03/23/2017 -- they're trying their best to offload him but he continues to have problems doing this and has developed a deep tissue injury to the right of his original wound between the 6 and 12:00 position for about 2 cm from the wound edge 04/03/2017 -- patient's sister Marylu Lund is at the bedside and we have discussed the need to completely offload and I have  recommended that he does not go to the daycare center where he sitting on a wheelchair for most of the time. He will be better off on bed with change of position appropriately. 04/10/2017 -- the patient's sister and his Aides from the home, finding it extremely difficult to offload him and he has not been able to take pressure off his sacral region. [place], Spiro ([id number]) Objective Constitutional Pulse regular. Respirations normal and unlabored. Afebrile. Vitals Time Taken: 3:27 PM, Height: 60 in, Weight: 92 lbs, BMI: 18, Temperature: 98.4 F, Pulse: 72 bpm, Respiratory Rate: 16 breaths/min, Blood Pressure: 106/41 mmHg. Eyes Nonicteric. Reactive to light. Ears, Nose, Mouth, and Throat Lips, teeth, and gums WNL.[name] Moist mucosa without lesions. Neck  supple and nontender. No palpable supraclavicular or cervical adenopathy. Normal sized without goiter. Respiratory WNL. No retractions.. Cardiovascular Pedal Pulses WNL. No clubbing, cyanosis or edema. Lymphatic No adneopathy. No adenopathy. No adenopathy. Musculoskeletal Adexa without tenderness or enlargement.. Digits and nails w/o clubbing, cyanosis, infection, petechiae, ischemia, or inflammatory conditions.[name] Psychiatric Judgement and insight Intact.. No evidence of depression, anxiety, or agitation.. General Notes: the wound is now progressively gotten much worse and there is a lot of subcutaneous debris and today I was unable to remove any move sharply as it is extremely tender. There is a lot of undermining between the 9 and 3:00 position. This is beyond the scope of outpatient debridement and he will need debridement under anesthesia. Integumentary (Hair, Skin) No suspicious lesions. No crepitus or fluctuance. No peri-wound warmth or erythema. No masses.. Wound #1 status is Open. Original cause of wound was Pressure Injury. The wound is located on the Right [place], Ayham ([id number]) Sacrum. The wound measures 4.5cm length x  2.5cm width x 2.2cm depth; 8.836cm^2 area and 19.439cm^3 volume. There is Fat Layer (Subcutaneous Tissue) Exposed exposed. There is no tunneling noted, however, there is undermining starting at 12:00 and ending at 12:00 with a maximum distance of 5cm. There is a large amount of serous drainage noted. Foul odor after cleansing was noted. The wound margin is flat and intact. There is no granulation within the wound bed. There is a large (67-100%) amount of necrotic tissue within the wound bed including Eschar and Adherent Slough. The periwound skin appearance exhibited: Maceration, Ecchymosis, Erythema. The surrounding wound skin color is noted with erythema which is circumferential. Periwound temperature was noted as No Abnormality. The periwound has tenderness on palpation. Wound #2 status is Open. Original cause of wound was Blister. The wound is located on the Left,Dorsal Forearm. The wound measures 2cm length x 0.8cm width x 0.1cm depth; 1.257cm^2 area and 0.126cm^3 volume. There is no tunneling or undermining noted. There is a large amount of serous drainage noted. The wound margin is flat and intact. There is large (67-100%) red granulation within the wound bed. There is a small (1-33%) amount of necrotic tissue within the wound bed including Adherent Slough. Periwound temperature was noted as No Abnormality. The periwound has tenderness on palpation. Assessment Active Problems ICD-10 Q90.9 - Down syndrome, unspecified L89.313 - Pressure ulcer of right buttock, stage 3 L89.153 - Pressure ulcer of sacral region, stage 3 E44.1 - Mild protein-calorie malnutrition Plan Wound Cleansing: Wound #1 Right Sacrum: Clean wound with Normal Saline. Cleanse wound with mild soap and water May Shower, gently pat wound dry prior to applying new dressing. - Pt may shower every other day before dressing changes unless soiled. Wound #2 Left,Dorsal Forearm: Clean wound with Normal Saline. Cleanse  wound with mild soap and water May Shower, gently pat wound dry prior to applying new dressing. - Pt may shower every other day before dressing changes unless soiled. Anesthetic: Wound #1 Right Sacrum: OTTAVIO, NOREM ([id number]) Topical Lidocaine 4% cream applied to wound bed prior to debridement - for clinic use Wound #2 Left,Dorsal Forearm: Topical Lidocaine 4% cream applied to wound bed prior to debridement - for clinic use Skin Barriers/Peri-Wound Care: Wound #1 Right Sacrum: Skin Prep Primary Wound Dressing: Wound #1 Right Sacrum: Santyl Ointment Secondary Dressing: Wound #1 Right Sacrum: ABD pad Dry Gauze Conform/Kerlix - lightly pack in wound with the Santyl Wound #2 Left,Dorsal Forearm: Boardered Foam Dressing Dressing Change Frequency: Wound #1 Right Sacrum: Change dressing every day. Other: -  change PRN as soiled Wound #2 Left,Dorsal Forearm: Change dressing every other day. Follow-up Appointments: Wound #1 Right Sacrum: Return Appointment in 1 week. Other: - depending on hospital admission Wound #2 Left,Dorsal Forearm: Return Appointment in 1 week. Other: - depending on hospital admission Off-Loading: Wound #1 Right Sacrum: Turn and reposition every 2 hours - PLEASE REPOSITION EVERY 2 HOURS AND AS NEEDED Other: - Pt needs to take off time from [facility] until further notice because pt needs to not to be sitting in the wheelchair for long periods at a time and needs bed rest for wound healing. Wound #2 Left,Dorsal Forearm: Turn and reposition every 2 hours - PLEASE REPOSITION EVERY 2 HOURS AND AS NEEDED Other: - Pt needs to take off time from [facility] until further notice because pt needs to not to be sitting in the wheelchair for long periods at a time and needs bed rest for wound healing. Additional Orders / Instructions: Wound #1 Right Sacrum: Increase protein intake. Other: - Go straight to the ER. Wound #2 Left,Dorsal Forearm: Increase protein  intake. Other: - Go straight to the ER. Medications-please add to medication list.: Wound #1 Right Sacrum: Other: - Take Vitamin A, Vitamin C, Zinc, Multivitamin Wound #2 Left,Dorsal Forearm: Other: - Take Vitamin A, Vitamin C, Zinc, Multivitamin Wichert, Elizah ([id number]) the patient's wound has gotten much worse since the last week and today after review today I have recommended: 1. Santyl ointment and a bordered foam, to be applied now. 2. The wound is beyond outpatient debridement and care and he will need inpatient treatment and possibly debridement under anesthesia after an appropriate workup with x-rays to make sure there is no osteomyelitis and also IV antibiotics. 3. I have spoken to the ER physician Dr. Alphonzo Lemmings, who has kindly accepted the care of the patient and he will involve the surgical team 4. he may end up with a wound VAC and we would be happy to manage this for him as an outpatient. His sister was at the bedside today and I have answered several of her questions to her satisfaction. Electronic Signature(s) Signed: 04/10/2017 4:24:25 PM By: Evlyn Kanner MD, FACS Previous Signature: 04/10/2017 4:23:50 PM Version By: Evlyn Kanner MD, FACS Entered By: Evlyn Kanner on 04/10/2017 16:24:24 JAIR, LINDBLAD ([id number]) -------------------------------------------------------------------------------- SuperBill Details Patient Name: [name] Date of Service: 04/10/2017 Medical Record Number: [id number] Patient Account Number: [id number] Date of Birth/Sex: March 11, 1956 (61 y.o. Male) Treating RN: Ashok Cordia, Debi Primary Care Provider: Everardo Pacific Other Clinician: Referring Provider: Everardo Pacific Treating Provider/Extender: Rudene Re in Treatment: 4 Diagnosis Coding ICD-10 Codes Code Description Q90.9 Down syndrome, unspecified L89.313 Pressure ulcer of right buttock, stage 3 L89.153 Pressure ulcer of sacral region, stage 3 E44.1 Mild  protein-calorie malnutrition Facility Procedures CPT4 Code: [id number] Description: 99214 - WOUND CARE VISIT-LEV 4 EST PT Modifier: Quantity: 1 Physician Procedures CPT4 Code: [id number] Description: 99214 - WC PHYS LEVEL 4 - EST PT ICD-10 Description Diagnosis Q90.9 Down syndrome, unspecified L89.313 Pressure ulcer of right buttock, stage 3 L89.153 Pressure ulcer of sacral region, stage 3 E44.1 Mild protein-calorie malnutrition Modifier: Quantity: 1 Electronic Signature(s) Signed: 04/10/2017 4:37:28 PM By: Evlyn Kanner MD, FACS Signed: 04/10/2017 5:16:33 PM By: Alejandro Mulling Previous Signature: 04/10/2017 4:24:37 PM Version By: Evlyn Kanner MD, FACS Entered By: Alejandro Mulling on 04/10/2017 16:26:49

## 2017-04-11 NOTE — Care Management Obs Status (Signed)
MEDICARE OBSERVATION STATUS NOTIFICATION   Patient Details  Name: Jesus Morton MRN: [id number] Date of Birth: Nov 13, 1955   Medicare Observation Status Notification Given:  Yes (Telephone reviewed with patient's brother [name] who is legal gaurdain )    Chapman Fitch, RN 04/11/2017, 5:21 PM

## 2017-04-11 NOTE — Progress Notes (Addendum)
AYVIN, LIPINSKI ([id number]) Visit Report for 04/10/2017 Arrival Information Details Patient Name: Jesus Morton, Jesus Morton Date of Service: 04/10/2017 3:15 PM Medical Record Number: [id number] Patient Account Number: [id number] Date of Birth/Sex: [birth year] (61 y.o. Male) Treating RN: Ashok Cordia, Debi Primary Care Razi Hickle: Everardo Pacific Other Clinician: Referring Bellami Farrelly: Everardo Pacific Treating Jade Burkard/Extender: Rudene Re in Treatment: 4 Visit Information History Since Last Visit All ordered tests and consults were completed: No Patient Arrived: Wheel Chair Added or deleted any medications: No Arrival Time: 15:26 Any new allergies or adverse reactions: No Accompanied By: caregivers, sister Had a fall or experienced change in No activities of daily living that may affect Transfer Assistance: Other risk of falls: Patient Identification Verified: Yes Signs or symptoms of abuse/neglect since last visito No Secondary Verification Process Completed: Yes Hospitalized since last visit: No Patient Requires Transmission-Based No Has Dressing in Place as Prescribed: Yes Precautions: Pain Present Now: No Patient Has Alerts: No Electronic Signature(s) Signed: 04/10/2017 5:16:33 PM By: Alejandro Mulling Entered By: Alejandro Mulling on 04/10/2017 15:27:27 Jesus Morton ([id number]) -------------------------------------------------------------------------------- Clinic Level of Care Assessment Details Patient Name: Jesus Morton Date of Service: 04/10/2017 3:15 PM Medical Record Number: [id number] Patient Account Number: [id number] Date of Birth/Sex: [birth year] (61 y.o. Male) Treating RN: Ashok Cordia, Debi Primary Care Ciin Brazzel: Everardo Pacific Other Clinician: Referring Malikai Gut: Everardo Pacific Treating Angelicia Lessner/Extender: Rudene Re in Treatment: 4 Clinic Level of Care Assessment Items TOOL 4 Quantity Score X - Use when only an EandM is performed  on FOLLOW-UP visit 1 0 ASSESSMENTS - Nursing Assessment / Reassessment X - Reassessment of Co-morbidities (includes updates in patient status) 1 10 X- 1 5 Reassessment of Adherence to Treatment Plan ASSESSMENTS - Wound and Skin Assessment / Reassessment  - Simple Wound Assessment / Reassessment - one wound 0 X- 2 5 Complex Wound Assessment / Reassessment - multiple wounds  - 0 Dermatologic / Skin Assessment (not related to wound area) ASSESSMENTS - Focused Assessment  - Circumferential Edema Measurements - multi extremities 0  - 0 Nutritional Assessment / Counseling / Intervention  - 0 Lower Extremity Assessment (monofilament, tuning fork, pulses)  - 0 Peripheral Arterial Disease Assessment (using hand held doppler) ASSESSMENTS - Ostomy and/or Continence Assessment and Care  - Incontinence Assessment and Management 0  - 0 Ostomy Care Assessment and Management (repouching, etc.) PROCESS - Coordination of Care  - Simple Patient / Family Education for ongoing care 0 X- 1 20 Complex (extensive) Patient / Family Education for ongoing care X- 1 10 Staff obtains Chiropractor, Records, Test Results / Process Orders X- 1 10 Staff telephones HHA, Nursing Homes / Clarify orders / etc  - 0 Routine Transfer to another Facility (non-emergent condition) X- 1 10 Routine Hospital Admission (non-emergent condition)  - 0 New Admissions / Manufacturing engineer / Ordering NPWT, Apligraf, etc.  - 0 Emergency Hospital Admission (emergent condition) X- 1 10 Simple Discharge Coordination Jesus Morton, Jesus Morton ([id number])  - 0 Complex (extensive) Discharge Coordination PROCESS - Special Needs  - Pediatric / Minor Patient Management 0  - 0 Isolation Patient Management  - 0 Hearing / Language / Visual special needs  - 0 Assessment of Community assistance (transportation, D/C planning, etc.)  - 0 Additional assistance / Altered mentation  - 0 Support  Surface(s) Assessment (bed, cushion, seat, etc.) INTERVENTIONS - Wound Cleansing / Measurement  - Simple Wound Cleansing - one wound 0 X- 2 5 Complex Wound Cleansing - multiple wounds X- 1 5 Wound Imaging (photographs - any number of wounds)  - 0  Wound Tracing (instead of photographs)  - 0 Simple Wound Measurement - one wound X- 2 5 Complex Wound Measurement - multiple wounds INTERVENTIONS - Wound Dressings X - Small Wound Dressing one or multiple wounds 2 10  - 0 Medium Wound Dressing one or multiple wounds  - 0 Large Wound Dressing one or multiple wounds X- 1 5 Application of Medications - topical  - 0 Application of Medications - injection INTERVENTIONS - Miscellaneous  - External ear exam 0  - 0 Specimen Collection (cultures, biopsies, blood, body fluids, etc.)  - 0 Specimen(s) / Culture(s) sent or taken to Lab for analysis  - 0 Patient Transfer (multiple staff / Nurse, adult / Similar devices)  - 0 Simple Staple / Suture removal (25 or less)  - 0 Complex Staple / Suture removal (26 or more)  - 0 Hypo / Hyperglycemic Management (close monitor of Blood Glucose)  - 0 Ankle / Brachial Index (ABI) - do not check if billed separately X- 1 5 Vital Signs Jesus Morton, Jesus Morton ([id number]) Has the patient been seen at the hospital within the last three years: Yes Total Score: 140 Level Of Care: New/Established - Level 4 Electronic Signature(s) Signed: 04/10/2017 5:16:33 PM By: Alejandro Mulling Entered By: Alejandro Mulling on 04/10/2017 16:26:42 Jesus Morton ([id number]) -------------------------------------------------------------------------------- Encounter Discharge Information Details Patient Name: Jesus Morton Date of Service: 04/10/2017 3:15 PM Medical Record Number: [id number] Patient Account Number: [id number] Date of Birth/Sex: 12-10-1955 (61 y.o. Male) Treating RN: Ashok Cordia, Debi Primary Care Barnet Benavides: Everardo Pacific Other  Clinician: Referring Kham Zuckerman: Everardo Pacific Treating Ozetta Flatley/Extender: Rudene Re in Treatment: 4 Encounter Discharge Information Items Discharge Pain Level: 0 Discharge Condition: Stable Ambulatory Status: Wheelchair Emergency Discharge Destination: Room Transportation: Private Auto caregivers, Accompanied By: sister Schedule Follow-up Appointment: Yes Medication Reconciliation completed and No provided to Patient/Care Kimm Ungaro: Provided on Clinical Summary of Care: 04/10/2017 Form Type Recipient Paper Patient [place] Electronic Signature(s) Signed: 04/10/2017 5:16:33 PM By: Alejandro Mulling Entered By: Alejandro Mulling on 04/10/2017 16:21:47 Jesus Morton ([id number]) -------------------------------------------------------------------------------- Lower Extremity Assessment Details Patient Name: Jesus Morton Date of Service: 04/10/2017 3:15 PM Medical Record Number: [id number] Patient Account Number: [id number] Date of Birth/Sex: 03/05/1956 (61 y.o. Male) Treating RN: Phillis Haggis Primary Care Lakeisa Heninger: Everardo Pacific Other Clinician: Referring Chinenye Katzenberger: Everardo Pacific Treating Delle Andrzejewski/Extender: Rudene Re in Treatment: 4 Electronic Signature(s) Signed: 04/10/2017 5:16:33 PM By: Alejandro Mulling Entered By: Alejandro Mulling on 04/10/2017 15:44:33 Jesus Morton, Jesus Morton ([id number]) -------------------------------------------------------------------------------- Multi Wound Chart Details Patient Name: Jesus Morton Date of Service: 04/10/2017 3:15 PM Medical Record Number: [id number] Patient Account Number: [id number] Date of Birth/Sex: 1955-10-19 (61 y.o. Male) Treating RN: Ashok Cordia, Debi Primary Care Shaqueena Mauceri: Everardo Pacific Other Clinician: Referring Kymberli Wiegand: Everardo Pacific Treating Michial Disney/Extender: Rudene Re in Treatment: 4 Vital Signs Height(in): 60 Pulse(bpm): 72 Weight(lbs): 92 Blood  Pressure(mmHg): 106/41 Body Mass Index(BMI): 18 Temperature(F): 98.4 Respiratory Rate 16 (breaths/min): Photos: [1:No Photos] [2:No Photos] [N/A:N/A] Wound Location: [1:Right Sacrum] [2:Left Forearm - Dorsal] [N/A:N/A] Wounding Event: [1:Pressure Injury] [2:Blister] [N/A:N/A] Primary Etiology: [1:Pressure Ulcer] [2:To be determined] [N/A:N/A] Comorbid History: [1:Anemia, Hypotension, Dementia] [2:Anemia, Hypotension, Dementia] [N/A:N/A] Date Acquired: [1:02/16/2017] [2:04/08/2017] [N/A:N/A] Weeks of Treatment: [1:4] [2:0] [N/A:N/A] Wound Status: [1:Open] [2:Open] [N/A:N/A] Measurements L x W x D [1:4.5x2.5x2.2] [2:2x0.8x0.1] [N/A:N/A] (cm) Area (cm) : [1:8.836] [2:1.257] [N/A:N/A] Volume (cm) : [1:19.439] [2:0.126] [N/A:N/A] % Reduction in Area: [1:13.50%] [2:N/A] [N/A:N/A] % Reduction in Volume: [1:-1803.90%] [2:N/A] [N/A:N/A] Starting Position 1 [1:12] (o'clock): Ending Position 1 [1:12] (o'clock): Maximum Distance 1 (cm): [1:5] Undermining: [1:Yes] [2:No] [N/A:N/A]  Classification: [1:Category/Stage III] [2:Partial Thickness] [N/A:N/A] Exudate Amount: [1:Large] [2:Large] [N/A:N/A] Exudate Type: [1:Serous] [2:Serous] [N/A:N/A] Exudate Color: [1:amber] [2:amber] [N/A:N/A] Foul Odor After Cleansing: [1:Yes] [2:No] [N/A:N/A] Odor Anticipated Due to [1:No] [2:N/A] [N/A:N/A] Product Use: Wound Margin: [1:Flat and Intact] [2:Flat and Intact] [N/A:N/A] Granulation Amount: [1:None Present (0%)] [2:Large (67-100%)] [N/A:N/A] Granulation Quality: [1:N/A] [2:Red] [N/A:N/A] Necrotic Amount: [1:Large (67-100%)] [2:Small (1-33%)] [N/A:N/A] Necrotic Tissue: [1:Eschar, Adherent Slough] [2:Adherent Slough] [N/A:N/A] Exposed Structures: [1:Fat Layer (Subcutaneous Tissue) Exposed: Yes Fascia: No Tendon: No] [2:N/A] [N/A:N/A] Muscle: No Joint: No Bone: No Epithelialization: None None N/A Periwound Skin Texture: No Abnormalities Noted No Abnormalities Noted N/A Periwound Skin  Moisture: Maceration: Yes No Abnormalities Noted N/A Periwound Skin Color: Ecchymosis: Yes No Abnormalities Noted N/A Erythema: Yes Erythema Location: Circumferential N/A N/A Temperature: No Abnormality No Abnormality N/A Tenderness on Palpation: Yes Yes N/A Wound Preparation: Ulcer Cleansing: Ulcer Cleansing: N/A Rinsed/Irrigated with Saline Rinsed/Irrigated with Saline Topical Anesthetic Applied: Topical Anesthetic Applied: Other: lidocaine 4% Other: lidocaine 4% Treatment Notes Electronic Signature(s) Signed: 04/10/2017 4:18:46 PM By: Evlyn Kanner MD, FACS Entered By: Evlyn Kanner on 04/10/2017 16:18:46 Jesus Morton, Jesus Morton ([id number]) -------------------------------------------------------------------------------- Multi-Disciplinary Care Plan Details Patient Name: Jesus Morton Date of Service: 04/10/2017 3:15 PM Medical Record Number: [id number] Patient Account Number: [id number] Date of Birth/Sex: 06/07/1956 (61 y.o. Male) Treating RN: Phillis Haggis Primary Care Jaquita Bessire: Everardo Pacific Other Clinician: Referring Drelyn Pistilli: Everardo Pacific Treating Suheily Birks/Extender: Rudene Re in Treatment: 4 Active Inactive Electronic Signature(s) Signed: 06/07/2017 10:00:46 AM By: Alejandro Mulling Previous Signature: 04/24/2017 9:03:56 AM Version By: Elliot Gurney BSN, RN, CWS, Kim RN, BSN Previous Signature: 04/10/2017 5:16:33 PM Version By: Alejandro Mulling Entered By: Alejandro Mulling on 06/07/2017 10:00:45 Jesus Morton ([id number]) -------------------------------------------------------------------------------- Pain Assessment Details Patient Name: Jesus Morton Date of Service: 04/10/2017 3:15 PM Medical Record Number: [id number] Patient Account Number: [id number] Date of Birth/Sex: 1956-02-22 (61 y.o. Male) Treating RN: Ashok Cordia, Debi Primary Care Kylene Zamarron: Everardo Pacific Other Clinician: Referring Jaleya Pebley: Everardo Pacific Treating  Blinda Turek/Extender: Rudene Re in Treatment: 4 Active Problems Location of Pain Severity and Description of Pain Patient Has Paino No Site Locations Pain Management and Medication Current Pain Management: Electronic Signature(s) Signed: 04/10/2017 5:16:33 PM By: Alejandro Mulling Entered By: Alejandro Mulling on 04/10/2017 15:27:33 Jesus Morton ([id number]) -------------------------------------------------------------------------------- Patient/Caregiver Education Details Patient Name: Jesus Morton Date of Service: 04/10/2017 3:15 PM Medical Record Number: [id number] Patient Account Number: [id number] Date of Birth/Gender: 1956-07-01 (61 y.o. Male) Treating RN: Phillis Haggis Primary Care Physician: Everardo Pacific Other Clinician: Referring Physician: Everardo Pacific Treating Physician/Extender: Rudene Re in Treatment: 4 Education Assessment Education Provided To: Patient and Caregiver Education Topics Provided Wound/Skin Impairment: Handouts: Other: Go straight to the ER Methods: Explain/Verbal Responses: State content correctly Electronic Signature(s) Signed: 04/10/2017 5:16:33 PM By: Alejandro Mulling Entered By: Alejandro Mulling on 04/10/2017 16:22:19 Jesus Morton ([id number]) -------------------------------------------------------------------------------- Wound Assessment Details Patient Name: Jesus Morton Date of Service: 04/10/2017 3:15 PM Medical Record Number: [id number] Patient Account Number: [id number] Date of Birth/Sex: February 04, 1956 (61 y.o. Male) Treating RN: Ashok Cordia, Debi Primary Care Jennesis Ramaswamy: Everardo Pacific Other Clinician: Referring Brinlynn Gorton: Everardo Pacific Treating Delores Thelen/Extender: Rudene Re in Treatment: 4 Wound Status Wound Number: 1 Primary Etiology: Pressure Ulcer Wound Location: Right Sacrum Wound Status: Open Wounding Event: Pressure Injury Comorbid History: Anemia, Hypotension,  Dementia Date Acquired: 02/16/2017 Weeks Of Treatment: 4 Clustered Wound: No Photos Photo Uploaded By: Alejandro Mulling on 04/10/2017 16:23:13 Wound Measurements Length: (cm) 4.5 Width: (cm) 2.5 Depth: (cm) 2.2 Area: (cm) 8.836 Volume: (cm) 19.439 % Reduction in  Area: 13.5% % Reduction in Volume: -1803.9% Epithelialization: None Tunneling: No Undermining: Yes Starting Position (o'clock): 12 Ending Position (o'clock): 12 Maximum Distance: (cm) 5 Wound Description Classification: Category/Stage III Wound Margin: Flat and Intact Exudate Amount: Large Exudate Type: Serous Exudate Color: amber Foul Odor After Cleansing: Yes Due to Product Use: No Slough/Fibrino Yes Wound Bed Granulation Amount: None Present (0%) Exposed Structure Necrotic Amount: Large (67-100%) Fascia Exposed: No Necrotic Quality: Eschar, Adherent Slough Fat Layer (Subcutaneous Tissue) Exposed: Yes Tendon Exposed: No Muscle Exposed: No Jesus Morton, Jesus Morton ([id number]) Joint Exposed: No Bone Exposed: No Periwound Skin Texture Texture Color No Abnormalities Noted: No No Abnormalities Noted: No Ecchymosis: Yes Moisture Erythema: Yes No Abnormalities Noted: No Erythema Location: Circumferential Maceration: Yes Temperature / Pain Temperature: No Abnormality Tenderness on Palpation: Yes Wound Preparation Ulcer Cleansing: Rinsed/Irrigated with Saline Topical Anesthetic Applied: Other: lidocaine 4%, Electronic Signature(s) Signed: 04/10/2017 5:16:33 PM By: Alejandro Mulling Entered By: Alejandro Mulling on 04/10/2017 15:39:00 Jesus Morton, Jesus Morton ([id number]) -------------------------------------------------------------------------------- Wound Assessment Details Patient Name: Jesus Morton Date of Service: 04/10/2017 3:15 PM Medical Record Number: [id number] Patient Account Number: [id number] Date of Birth/Sex: 06-10-1956 (61 y.o. Male) Treating RN: Ashok Cordia, Debi Primary Care Devinn Hurwitz: Everardo Pacific Other Clinician: Referring Yamato Kopf: Everardo Pacific Treating Leiby Pigeon/Extender: Rudene Re in Treatment: 4 Wound Status Wound Number: 2 Primary Etiology: To be determined Wound Location: Left Forearm - Dorsal Wound Status: Open Wounding Event: Blister Comorbid History: Anemia, Hypotension, Dementia Date Acquired: 04/08/2017 Weeks Of Treatment: 0 Clustered Wound: No Photos Photo Uploaded By: Alejandro Mulling on 04/10/2017 16:23:14 Wound Measurements Length: (cm) 2 Width: (cm) 0.8 Depth: (cm) 0.1 Area: (cm) 1.257 Volume: (cm) 0.126 % Reduction in Area: % Reduction in Volume: Epithelialization: None Tunneling: No Undermining: No Wound Description Classification: Partial Thickness Wound Margin: Flat and Intact Exudate Amount: Large Exudate Type: Serous Exudate Color: amber Foul Odor After Cleansing: No Slough/Fibrino Yes Wound Bed Granulation Amount: Large (67-100%) Granulation Quality: Red Necrotic Amount: Small (1-33%) Necrotic Quality: Adherent Slough Periwound Skin Texture Texture Color No Abnormalities Noted: No No Abnormalities Noted: No Moisture Temperature / Pain Jesus Morton, Jesus Morton ([id number]) No Abnormalities Noted: No Temperature: No Abnormality Tenderness on Palpation: Yes Wound Preparation Ulcer Cleansing: Rinsed/Irrigated with Saline Topical Anesthetic Applied: Other: lidocaine 4%, Electronic Signature(s) Signed: 04/10/2017 5:16:33 PM By: Alejandro Mulling Entered By: Alejandro Mulling on 04/10/2017 15:42:09 Jesus Morton, Jesus Morton ([id number]) -------------------------------------------------------------------------------- Vitals Details Patient Name: Jesus Morton Date of Service: 04/10/2017 3:15 PM Medical Record Number: [id number] Patient Account Number: [id number] Date of Birth/Sex: Mar 21, 1956 (61 y.o. Male) Treating RN: Ashok Cordia, Debi Primary Care Cathey Fredenburg: Everardo Pacific Other Clinician: Referring Ginia Rudell:  Everardo Pacific Treating Kelcee Bjorn/Extender: Rudene Re in Treatment: 4 Vital Signs Time Taken: 15:27 Temperature (F): 98.4 Height (in): 60 Pulse (bpm): 72 Weight (lbs): 92 Respiratory Rate (breaths/min): 16 Body Mass Index (BMI): 18 Blood Pressure (mmHg): 106/41 Reference Range: 80 - 120 mg / dl Electronic Signature(s) Signed: 04/10/2017 5:16:33 PM By: Alejandro Mulling Entered By: Alejandro Mulling on 04/10/2017 15:28:16

## 2017-04-12 DIAGNOSIS — L89154 Pressure ulcer of sacral region, stage 4: Secondary | ICD-10-CM | POA: Diagnosis not present

## 2017-04-12 MED ORDER — ENSURE ENLIVE PO LIQD
237.0000 mL | Freq: Two times a day (BID) | ORAL | Status: DC
  Administered 2017-04-13 – 2017-04-17 (×7): 237 mL via ORAL

## 2017-04-12 MED ORDER — GUAIFENESIN 100 MG/5ML PO SOLN
5.0000 mL | ORAL | Status: DC | PRN
  Administered 2017-04-16: 100 mg via ORAL
  Filled 2017-04-12 (×2): qty 5

## 2017-04-12 MED ORDER — DOCUSATE SODIUM 50 MG/5ML PO LIQD
100.0000 mg | Freq: Two times a day (BID) | ORAL | Status: DC
  Administered 2017-04-12 – 2017-04-17 (×9): 100 mg via ORAL
  Filled 2017-04-12 (×11): qty 10

## 2017-04-12 NOTE — Progress Notes (Addendum)
Initial Nutrition Assessment  DOCUMENTATION CODES:   Not applicable  INTERVENTION:  1. Recommend Ensure Enlive po BID, each supplement provides 350 kcal and 20 grams of protein  2. Continue MVI w/ Minerals  3. Recommend Zinc  (  elemental) daily, Vitamin C  BID daily for wound healing  NUTRITION DIAGNOSIS:   Increased nutrient needs related to wound healing as evidenced by estimated needs.  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 Reported 15 pound/12.5% severe weight loss over the past 6 months. He has had his Stg IV wound for approximately 90-120 days. Eats a pureed diet, thin liquids at group home. Per caregiver he was eating well, PTA but has continued to lose weight. Per family it seems patient will eat small amounts and then say he isn't hungry. He is a total feed assist. Had some green beans, mashed potatoes, and chicken today - ate 25% No history of nausea/vomiting or diarrhea/constipation.  Nutrition-Focused physical exam completed. Findings are no fat depletion, moderate-severe muscle depletion at calves, and no edema.  Per caregiver he has been anxious that he may fall and thus has been spending a lot of time in bed recently, likely contributing to muscle wasting at calves and his wound. Patient has hammer toe and bunions per family.  Suspect but unable to diagnose malnutrition at this time.  Labs reviewed  Medications reviewed and include:  Ca-Carbonate, Vitamin D, Colace, B12 NS at 149mL/hr  Diet Order:  DIET - DYS 1 Room service appropriate? Yes; Fluid consistency: Thin  Skin:  Wound (see  comment) (Unstagable to sacrum)  Last BM:  PTA  Height:   Ht Readings from Last 1 Encounters:  04/10/17 5' (1.524 m)    Weight:   Wt Readings from Last 1 Encounters:  04/12/17 105 lb 12.8 oz (48 kg)    Ideal Body Weight:  45.45 kg  BMI:  Body mass index is 20.66 kg/m.  Estimated Nutritional Needs:   Kcal:  1575-1670 calories (33-35 cal/kg)  Protein:  72-95 grams (1.5-2g/kg)  Fluid:  1.5-1.7L  EDUCATION NEEDS:   Education needs addressed  Dionne Ano. Cashe Gatt, MS, RD LDN Inpatient Clinical Dietitian Pager [phone]

## 2017-04-12 NOTE — Progress Notes (Signed)
Dr Excell Seltzer said he does not plan to do surgery.  He will be seeing the patient later today.  Dr patel notified and gave a diet order

## 2017-04-12 NOTE — Consult Note (Signed)
WOC Nurse wound consult note Reason for Consult:sacral pressure ulcer stage IV Wound type:pressure Pressure Injury POA: Yes Measurement: 5cm x 4cm x 2.4cm with undermining from 2-5cm from 6 o'clock to 2 o'clock  Wound bed:100% tan slough Drainage (amount, consistency, odor) copious, malodorous Periwound: intact  Dressing procedure/placement/frequency: Patient with a long standing wound with a 3cm round piece of flesh handing from wound attached only at the proximal end of wound. The chart reflects that this dressing has been changed less than 2 hours ago and it is saturated with tan thick purulent malodorous drainage.  Therefore I have provided orders for Cleanse sacral wound with NS, pat dry, apply aquacel Ag+ to wound bed, use cotton tip applicator to put into the undermining areas from 6 o'clock to 2 o'clock, cover with gauze, ABD, adhere with paper tape, perform BID. I have placed this am dressing, however without help, all nurses were in a meeting, no tech help arrived, it was impossible to pack into undermining. This patient's nutritional level is less than optimal for wound healing.  Pt could benefit from protein supplements or nutritional consult, please order if you agree.  Reviewed with pt's sister the importance of keeping pt off of this area. Positioned pt with pillow, side rails up x 2, bed low, safety alarm activated. Surgery has been consulted and is trying to reach POA for surgical decisions. Will check chart to see what is decided. Will not officially follow at this point but will be available to patient, nursing staff, medical and surgical providers.  Please re-consult if we need to assist further.   Barnett Hatter, RN-C, WTA-C Wound Treatment Associate

## 2017-04-12 NOTE — Progress Notes (Signed)
[facility] [place] [facility]   PATIENT NAME: Jesus Morton    MR#:  [id number]  DATE OF BIRTH:  01/29/1956  SUBJECTIVE:  CHIEF COMPLAINT:   Chief Complaint  Patient presents with  . Wound Infection    Sent from his group home due to wound on his scrotum.  Patient's sister waiting at bedside awaiting discussion with surgery regarding further treatment options    REVIEW OF SYSTEMS:   Pt is not able to give ROS.  ROS  DRUG ALLERGIES:  No Known Allergies  VITALS:  Blood pressure (!) 109/49, pulse 69, temperature 97.7 F (36.5 C), temperature source Oral, resp. rate 18, height 5' (1.524 m), weight 105 lb 12.8 oz (48 kg), SpO2 96 %.  PHYSICAL EXAMINATION:   Constitutional: He appears well-developed and well-nourished. No distress.  HENT:  Head: Normocephalic and atraumatic.  Mouth/Throat: Oropharynx is clear and moist. Mucous membranes are dry.  Eyes: Pupils are equal, round, and reactive to light. Conjunctivae and EOM are normal. No scleral icterus.  Neck: Normal range of motion. Neck supple. No JVD present. No tracheal deviation present. No thyromegaly present.  Cardiovascular: Normal rate, regular rhythm and normal heart sounds.  Exam reveals no gallop and no friction rub.   No murmur heard. Respiratory: Effort normal and breath sounds normal. No respiratory distress.  GI: Soft. Bowel sounds are normal. He exhibits no distension. There is no tenderness.  Genitourinary:  Genitourinary Comments: Deferred  Musculoskeletal: Normal range of motion. He exhibits no edema.  Lymphadenopathy:    He has no cervical adenopathy.  Neurological: He is alert. No cranial nerve deficit.  The patient has not consistently verbal thus it is difficult to assess orientation  Skin: Skin is warm and dry. No rash noted. No erythema.  Decubitus ulcer sacrum packed  Psychiatric: He has a normal mood and affect. His behavior is normal. Judgment and thought content normal.    Physical Exam LABORATORY PANEL:   CBC  Recent Labs Lab 04/10/17 1648  WBC 8.2  HGB 13.9  HCT 40.7  PLT 364   ------------------------------------------------------------------------------------------------------------------  Chemistries   Recent Labs Lab 04/10/17 1648  NA 139  K 3.9  CL 99*  CO2 29  GLUCOSE 102*  BUN 20  CREATININE 0.71  CALCIUM 8.7*  AST 31  ALT 19  ALKPHOS 95  BILITOT 0.4   ------------------------------------------------------------------------------------------------------------------  Cardiac Enzymes No results for input(s): TROPONINI in the last 168 hours. ------------------------------------------------------------------------------------------------------------------  RADIOLOGY:  Dg Sacrum/coccyx  Result Date: 04/10/2017 CLINICAL DATA:  Sacral wound. EXAM: SACRUM AND COCCYX - 2+ VIEW COMPARISON:  10/01/2016 FINDINGS: Diffuse osteopenia. Mild symmetric degenerative change of the hips unchanged. Mild degenerate change of the symphysis pubis joint. Mild degenerate change of the spine. No definite air in the soft tissues. Mild fecal retention over the rectosigmoid colon. IMPRESSION: No acute findings. Degenerative changes as described. Electronically Signed   By: Elberta Fortis M.D.   On: 04/10/2017 19:24    ASSESSMENT AND PLAN:   Active Problems:   Decubitus ulcer of sacral region, stage 4 (HCC)  1. Decubitus ulcer: Reportedly stage IV;  Per surgery will need debridement May need plastic surgery Patient will likely benefit from LTAC for longer term wound care  2. Dehydration: Continue IV fluids  3. GERD: The patient has history of aspiration pneumonia. He is on a pured diet. Continue PPI. 4. Dementia: Superimposed upon Down syndrome. Continue Namenda. Ativan for agitation.  5. DVT prophylaxis: SCDs 6. GI prophylaxis: As above  All the records are reviewed and case discussed with Care Management/Social Workerr. Management plans  discussed with the patient, family and they are in agreement.  CODE STATUS: partial.  TOTAL TIME TAKING CARE OF THIS PATIENT: 32 minutes.   POSSIBLE D/C IN 1-2 DAYS, DEPENDING ON CLINICAL CONDITION.   Auburn Bilberry M.D on 04/12/2017   Between 7am to 6pm - Pager - [phone]  After 6pm go to [email or web address] - password [employer]  [facility] [place] [facility]  Office  [phone]  CC: Primary care physician; McLean-Scocuzza, Pasty Spillers, MD  Note: This dictation was prepared with Dragon dictation along with smaller phrase technology. Any transcriptional errors that result from this process are unintentional.

## 2017-04-12 NOTE — Progress Notes (Signed)
CC: decub ulcer Subjective: This a patient with stage IV sacral decubitus ulcer. It is not infected but colonized and necrotic. He is noncommunicative except with his family who understand him. Review of systems is not possible.  Patient's family, sister. and brother are present for discussion.  Objective: Vital signs in last 24 hours: Temp:  [97.7 F (36.5 C)-99.4 F (37.4 C)] 97.7 F (36.5 C) (10/03 0552) Pulse Rate:  [69-83] 69 (10/03 1624) Resp:  [18] 18 (10/03 1624) BP: (109-130)/(49-56) 113/52 (10/03 1624) SpO2:  [96 %-100 %] 100 % (10/03 1624) Weight:  [105 lb 12.8 oz (48 kg)] 105 lb 12.8 oz (48 kg) (10/03 0600) Last BM Date:  (unsure, from a group home)  Intake/Output from previous day: No intake/output data recorded. Intake/Output this shift: Total I/O In: 4261.7 [I.V.:4261.7] Out: -   Physical exam:  Awake and alert vital signs are stable afebrile Decubitus not examined Multiple contractures obvious.  Lab Results: CBC   Recent Labs  04/10/17 1648  WBC 8.2  HGB 13.9  HCT 40.7  PLT 364   BMET  Recent Labs  04/10/17 1648  NA 139  K 3.9  CL 99*  CO2 29  GLUCOSE 102*  BUN 20  CREATININE 0.71  CALCIUM 8.7*   PT/INR No results for input(s): LABPROT, INR in the last 72 hours. ABG No results for input(s): PHART, HCO3 in the last 72 hours.  Invalid input(s): PCO2, PO2  Studies/Results: Dg Sacrum/coccyx  Result Date: 04/10/2017 CLINICAL DATA:  Sacral wound. EXAM: SACRUM AND COCCYX - 2+ VIEW COMPARISON:  10/01/2016 FINDINGS: Diffuse osteopenia. Mild symmetric degenerative change of the hips unchanged. Mild degenerate change of the symphysis pubis joint. Mild degenerate change of the spine. No definite air in the soft tissues. Mild fecal retention over the rectosigmoid colon. IMPRESSION: No acute findings. Degenerative changes as described. Electronically Signed   By: Elberta Fortis M.D.   On: 04/10/2017 19:24    Anti-infectives: Anti-infectives    Start     Dose/Rate Route Frequency Ordered Stop   04/10/17 1901  vancomycin (VANCOCIN) 1-5 GM/200ML-% IVPB  Status:  Discontinued    Comments:  Ailene Ards   : cabinet override      04/10/17 1901 04/10/17 1915   04/10/17 1900  vancomycin (VANCOCIN) IVPB 750 mg/150 ml premix     750 mg 150 mL/hr over 60 Minutes Intravenous  Once 04/10/17 1852 04/10/17 2212   04/10/17 1900  piperacillin-tazobactam (ZOSYN) IVPB 3.375 g     3.375 g 100 mL/hr over 30 Minutes Intravenous  Once 04/10/17 1852 04/10/17 1942      Assessment/Plan:  This patient with a small decubitus ulcer that is colonized but not infected. It requires debridement due to the necrotic tissue and older.  I spent over 30 minutes with family discussing the rationale for offering debridement but the fact that the debridement would not likely result in healing other than to improve hygiene and older etc. The definitive therapy for this would be either aggressive wound care and or plastic surgery flap reconstruction. Pack and only be done at one of the universities. I spoke social services as well and discussed the potential for LTAC transfer. Ultimately I plan to hold operating room time for Friday to perform debridement (I cannot get operating room time tomorrow (. And then the patient could go to an LTAC or could go to an LTAC as early as tomorrow and have debridement performed there. However be left up to the family. I did  state that I would try to save OR time on Friday and unless they change their mind or find alternative resources then we would proceed with debridement on Friday. The procedure was described for them as was a plastics procedure which I have little to no experience with an could only be performed in a university setting. They understood multiple questions were answered for them. They understood and agreed to proceed.  Lattie Haw, MD, FACS  04/12/2017

## 2017-04-13 DIAGNOSIS — K219 Gastro-esophageal reflux disease without esophagitis: Secondary | ICD-10-CM | POA: Diagnosis present

## 2017-04-13 DIAGNOSIS — Z79899 Other long term (current) drug therapy: Secondary | ICD-10-CM | POA: Diagnosis not present

## 2017-04-13 DIAGNOSIS — E860 Dehydration: Secondary | ICD-10-CM | POA: Diagnosis present

## 2017-04-13 DIAGNOSIS — F0390 Unspecified dementia without behavioral disturbance: Secondary | ICD-10-CM | POA: Diagnosis present

## 2017-04-13 DIAGNOSIS — Z66 Do not resuscitate: Secondary | ICD-10-CM | POA: Diagnosis present

## 2017-04-13 DIAGNOSIS — Q909 Down syndrome, unspecified: Secondary | ICD-10-CM | POA: Diagnosis not present

## 2017-04-13 DIAGNOSIS — M810 Age-related osteoporosis without current pathological fracture: Secondary | ICD-10-CM | POA: Diagnosis present

## 2017-04-13 DIAGNOSIS — L89154 Pressure ulcer of sacral region, stage 4: Secondary | ICD-10-CM | POA: Diagnosis present

## 2017-04-13 LAB — BASIC METABOLIC PANEL
Anion gap: 5 (ref 5–15)
BUN: 11 mg/dL (ref 6–20)
CO2: 28 mmol/L (ref 22–32)
Calcium: 8 mg/dL — ABNORMAL LOW (ref 8.9–10.3)
Chloride: 107 mmol/L (ref 101–111)
Creatinine, Ser: 0.71 mg/dL (ref 0.61–1.24)
GFR calc Af Amer: >60 mL/min (ref >60)
GFR calc non Af Amer: >60 mL/min (ref >60)
GLUCOSE: 100 mg/dL — AB (ref 65–99)
POTASSIUM: 3.9 mmol/L (ref 3.5–5.1)
SODIUM: 140 mmol/L (ref 135–145)

## 2017-04-13 LAB — CBC
HCT: 37.9 % — ABNORMAL LOW (ref 40.0–52.0)
Hemoglobin: 13.2 g/dL (ref 13.0–18.0)
MCH: 36.7 pg — AB (ref 26.0–34.0)
MCHC: 34.8 g/dL (ref 32.0–36.0)
MCV: 105.5 fL — AB (ref 80.0–100.0)
PLATELETS: 306 10*3/uL (ref 150–440)
RBC: 3.59 MIL/uL — AB (ref 4.40–5.90)
RDW: 14.4 % (ref 11.5–14.5)
WBC: 5.8 10*3/uL (ref 3.8–10.6)

## 2017-04-13 MED ORDER — POLYETHYLENE GLYCOL 3350 17 G PO PACK
17.0000 g | PACK | Freq: Every day | ORAL | Status: DC
  Administered 2017-04-15 – 2017-04-17 (×3): 17 g via ORAL
  Filled 2017-04-13 (×5): qty 1

## 2017-04-13 MED ORDER — ORAL CARE MOUTH RINSE
15.0000 mL | Freq: Two times a day (BID) | OROMUCOSAL | Status: DC
Start: 2017-04-13 — End: 2017-04-17
  Administered 2017-04-15 – 2017-04-17 (×5): 15 mL via OROMUCOSAL

## 2017-04-13 MED ORDER — ZINC SULFATE 220 (50 ZN) MG PO CAPS
220.0000 mg | ORAL_CAPSULE | Freq: Every day | ORAL | Status: DC
  Administered 2017-04-13 – 2017-04-17 (×4): 220 mg via ORAL
  Filled 2017-04-13 (×5): qty 1

## 2017-04-13 MED ORDER — CHLORHEXIDINE GLUCONATE 0.12 % MT SOLN
15.0000 mL | Freq: Two times a day (BID) | OROMUCOSAL | Status: DC
  Administered 2017-04-13 – 2017-04-17 (×8): 15 mL via OROMUCOSAL
  Filled 2017-04-13 (×9): qty 15

## 2017-04-13 MED ORDER — VITAMIN C 500 MG PO TABS
500.0000 mg | ORAL_TABLET | Freq: Every day | ORAL | Status: DC
  Administered 2017-04-13 – 2017-04-17 (×4): 500 mg via ORAL
  Filled 2017-04-13 (×5): qty 1

## 2017-04-13 MED ORDER — PANTOPRAZOLE SODIUM 40 MG PO TBEC: 40.0000 mg | DELAYED_RELEASE_TABLET | Freq: Every day | ORAL | Status: DC

## 2017-04-13 NOTE — Progress Notes (Signed)
[facility] - [place] [facility]   PATIENT NAME: Jesus Morton    MR#:  [id number]  DATE OF BIRTH:  October 24, 1955  SUBJECTIVE:  CHIEF COMPLAINT:   Chief Complaint  Patient presents with  . Wound Infection   Currently moaning  REVIEW OF SYSTEMS:   Pt is not able to give ROS.  ROS  DRUG ALLERGIES:  No Known Allergies  VITALS:  Blood pressure 139/72, pulse 68, temperature 98.4 F (36.9 C), temperature source Axillary, resp. rate 18, height 5' (1.524 m), weight 107 lb 14.4 oz (48.9 kg), SpO2 98 %.  PHYSICAL EXAMINATION:   Constitutional: He appears well-developed and well-nourished. No distress.  HENT:  Head: Normocephalic and atraumatic.  Mouth/Throat: Oropharynx is clear and moist. Mucous membranes are dry.  Eyes: Pupils are equal, round, and reactive to light. Conjunctivae and EOM are normal. No scleral icterus.  Neck: Normal range of motion. Neck supple. No JVD present. No tracheal deviation present. No thyromegaly present.  Cardiovascular: Normal rate, regular rhythm and normal heart sounds.  Exam reveals no gallop and no friction rub.   No murmur heard. Respiratory: Effort normal and breath sounds normal. No respiratory distress.  GI: Soft. Bowel sounds are normal. He exhibits no distension. There is no tenderness.  Genitourinary:  Genitourinary Comments: Deferred  Musculoskeletal: Normal range of motion. He exhibits no edema.  Lymphadenopathy:    He has no cervical adenopathy.  Neurological: He is alert. No cranial nerve deficit.  The patient has not consistently verbal thus it is difficult to assess orientation  Skin: Skin is warm and dry. No rash noted. No erythema.  Decubitus ulcer sacrum packed  Psychiatric: He has a normal mood and affect. His behavior is normal. Judgment and thought content normal.   Physical Exam LABORATORY PANEL:   CBC  Recent Labs Lab 04/13/17 0525  WBC 5.8  HGB 13.2  HCT 37.9*  PLT 306    ------------------------------------------------------------------------------------------------------------------  Chemistries   Recent Labs Lab 04/10/17 1648 04/13/17 0525  NA 139 140  K 3.9 3.9  CL 99* 107  CO2 29 28  GLUCOSE 102* 100*  BUN 20 11  CREATININE 0.71 0.71  CALCIUM 8.7* 8.0*  AST 31  --   ALT 19  --   ALKPHOS 95  --   BILITOT 0.4  --    ------------------------------------------------------------------------------------------------------------------  Cardiac Enzymes No results for input(s): TROPONINI in the last 168 hours. ------------------------------------------------------------------------------------------------------------------  RADIOLOGY:  No results found.  ASSESSMENT AND PLAN:   Active Problems:   Decubitus ulcer of sacral region, stage 4 (HCC)  1. Decubitus ulcer: Reportedly stage IV;  Per surgery will need debridement Probably on Friday Possible LTAC 2. Dehydration: Continue IV fluids 3. GERD: The patient has history of aspiration pneumonia. He is on a pured diet. Continue PPI. 4. Dementia: Superimposed upon Down syndrome. Continue Namenda. Ativan for agitation.  5. DVT prophylaxis: SCDs 6. GI prophylaxis: As above   All the records are reviewed and case discussed with Care Management/Social Workerr. Management plans discussed with the patient, family and they are in agreement.  CODE STATUS: partial.  TOTAL TIME TAKING CARE OF THIS PATIENT: 25 minutes.   POSSIBLE D/C IN 1-2 DAYS, DEPENDING ON CLINICAL CONDITION.   Auburn Bilberry M.D on 04/13/2017   Between 7am to 6pm - Pager - [phone]  After 6pm go to [email or web address] - password [employer]  [facility] [place] [facility]  Office  [phone]  CC: Primary care physician; McLean-Scocuzza, Pasty Spillers, MD  Note: This  dictation was prepared with Dragon dictation along with smaller phrase technology. Any transcriptional errors that result from this process are unintentional.

## 2017-04-13 NOTE — Care Management (Signed)
RNCM consult for LTACH.  Patient is from group home.   Patient has stage 4 wound on the sacrum.  Per MD patient will require debridement, and potentially plastic surgery for flap reconstruction.  Discussed LATCH with brother Francis Dowse) who is POA, and sister Marylu Lund).  Francis Dowse gave [facility] permission to see if patient would be a candidate for LTACH.  Patient would not qualify for [facility] as he does not have a 3 night ICU stay.  Francis Dowse is aware that only local LTACH available is [facility].    RNCM spoke with Loury from [facility].  He is discussing the case with leadership at [facility] to determine if patient is a candidate.  If notified that patient is a candidate, RNCM to follow up with Francis Dowse prior to initiating insurance authorization.

## 2017-04-14 ENCOUNTER — Encounter: Payer: Self-pay | Admitting: Surgery

## 2017-04-14 ENCOUNTER — Inpatient Hospital Stay: Payer: Medicare Other | Admitting: Anesthesiology

## 2017-04-14 ENCOUNTER — Encounter
Admission: EM | Disposition: A | Discharge status: Skilled Nursing Facility | Payer: Self-pay | Source: Home / Self Care | Attending: Internal Medicine

## 2017-04-14 HISTORY — PX: DEBRIDMENT OF DECUBITUS ULCER: SHX6276

## 2017-04-14 SURGERY — DEBRIDMENT OF DECUBITUS ULCER
Anesthesia: General | Wound class: Dirty or Infected

## 2017-04-14 MED ORDER — ONDANSETRON HCL 4 MG/2ML IJ SOLN
INTRAMUSCULAR | Status: AC
  Filled 2017-04-14: qty 2

## 2017-04-14 MED ORDER — ONDANSETRON HCL 4 MG/2ML IJ SOLN
INTRAMUSCULAR | Status: DC | PRN
  Administered 2017-04-14: 4 mg via INTRAVENOUS

## 2017-04-14 MED ORDER — OXYCODONE HCL 5 MG PO TABS: 5.0000 mg | ORAL_TABLET | Freq: Once | ORAL | Status: DC | PRN

## 2017-04-14 MED ORDER — EPHEDRINE SULFATE 50 MG/ML IJ SOLN
INTRAMUSCULAR | Status: AC
  Filled 2017-04-14: qty 1

## 2017-04-14 MED ORDER — PROPOFOL 10 MG/ML IV BOLUS
INTRAVENOUS | Status: DC | PRN
  Administered 2017-04-14: 100 mg via INTRAVENOUS

## 2017-04-14 MED ORDER — DEXAMETHASONE SODIUM PHOSPHATE 10 MG/ML IJ SOLN
INTRAMUSCULAR | Status: DC | PRN
  Administered 2017-04-14: 5 mg via INTRAVENOUS

## 2017-04-14 MED ORDER — FENTANYL CITRATE (PF) 100 MCG/2ML IJ SOLN
INTRAMUSCULAR | Status: DC | PRN
  Administered 2017-04-14 (×2): 50 ug via INTRAVENOUS

## 2017-04-14 MED ORDER — OXYCODONE HCL 5 MG/5ML PO SOLN: 5.0000 mg | Freq: Once | ORAL | Status: DC | PRN

## 2017-04-14 MED ORDER — EPHEDRINE SULFATE 50 MG/ML IJ SOLN
INTRAMUSCULAR | Status: DC | PRN
  Administered 2017-04-14: 10 mg via INTRAVENOUS

## 2017-04-14 MED ORDER — SUCCINYLCHOLINE CHLORIDE 20 MG/ML IJ SOLN
INTRAMUSCULAR | Status: DC | PRN
  Administered 2017-04-14: 100 mg via INTRAVENOUS

## 2017-04-14 MED ORDER — PROPOFOL 10 MG/ML IV BOLUS
INTRAVENOUS | Status: AC
  Filled 2017-04-14: qty 20

## 2017-04-14 MED ORDER — MEPERIDINE HCL 50 MG/ML IJ SOLN: 6.2500 mg | INTRAMUSCULAR | Status: DC | PRN

## 2017-04-14 MED ORDER — DEXAMETHASONE SODIUM PHOSPHATE 10 MG/ML IJ SOLN
INTRAMUSCULAR | Status: AC
  Filled 2017-04-14: qty 1

## 2017-04-14 MED ORDER — FENTANYL CITRATE (PF) 100 MCG/2ML IJ SOLN
INTRAMUSCULAR | Status: AC
  Filled 2017-04-14: qty 2

## 2017-04-14 MED ORDER — PROMETHAZINE HCL 25 MG/ML IJ SOLN: 6.2500 mg | INTRAMUSCULAR | Status: DC | PRN

## 2017-04-14 MED ORDER — FENTANYL CITRATE (PF) 100 MCG/2ML IJ SOLN: 25.0000 ug | INTRAMUSCULAR | Status: DC | PRN

## 2017-04-14 SURGICAL SUPPLY — 24 items
BLADE SURG 15 STRL LF DISP TIS (BLADE) ×1 IMPLANT
BLADE SURG 15 STRL SS (BLADE) ×3
CHLORAPREP W/TINT 26ML (MISCELLANEOUS) ×3 IMPLANT
DRAPE LAPAROTOMY 100X77 ABD (DRAPES) ×3 IMPLANT
DRAPE UTILITY 15X26 TOWEL STRL (DRAPES) ×3 IMPLANT
DRSG VAC ATS MED SENSATRAC (GAUZE/BANDAGES/DRESSINGS) ×2 IMPLANT
ELECT CAUTERY BLADE 6.4 (BLADE) ×3 IMPLANT
ELECT REM PT RETURN 9FT ADLT (ELECTROSURGICAL) ×3
ELECTRODE REM PT RTRN 9FT ADLT (ELECTROSURGICAL) ×1 IMPLANT
GAUZE SPONGE 4X4 12PLY STRL (GAUZE/BANDAGES/DRESSINGS) ×3 IMPLANT
GLOVE BIO SURGEON STRL SZ8 (GLOVE) ×3 IMPLANT
GOWN STRL REUS W/ TWL LRG LVL3 (GOWN DISPOSABLE) ×2 IMPLANT
GOWN STRL REUS W/TWL LRG LVL3 (GOWN DISPOSABLE) ×6
KIT RM TURNOVER STRD PROC AR (KITS) ×3 IMPLANT
LABEL OR SOLS (LABEL) ×3 IMPLANT
NDL HYPO 25X1 1.5 SAFETY (NEEDLE) ×1 IMPLANT
NEEDLE HYPO 25X1 1.5 SAFETY (NEEDLE) ×3 IMPLANT
NS IRRIG 500ML POUR BTL (IV SOLUTION) ×3 IMPLANT
PACK BASIN MINOR ARMC (MISCELLANEOUS) ×3 IMPLANT
SPONGE LAP 18X18 5 PK (GAUZE/BANDAGES/DRESSINGS) ×3 IMPLANT
SWAB DUAL CULTURE TRANS RED ST (MISCELLANEOUS) ×2 IMPLANT
SYR BULB EAR ULCER 3OZ GRN STR (SYRINGE) ×3 IMPLANT
SYRINGE 10CC LL (SYRINGE) ×3 IMPLANT
WND VAC CANISTER 500ML (MISCELLANEOUS) ×2 IMPLANT

## 2017-04-14 NOTE — Progress Notes (Signed)
Patient awake and alert and comfortable. Vital signs stable and reviewed Discussed options with family. Again reviewed the risks of bleeding infection recurrence and open wound with them. Plan is for debridement today.

## 2017-04-14 NOTE — Clinical Social Work Note (Signed)
CSW spoke with patient's brother, Jesus Morton, and explained my role and purpose of visit. CSW explained the STR process. Jesus Morton verbalized understanding. He was saying how his sister is concerned because patient lost his girlfriend of 3 to 4 years but he does not know it yet and he is not sure his brother would understand at this point. Jesus Morton stated that his sister is a resident at independent living at [facility]. CSW contacted Sue Lush at [facility] and they had already gotten a call from their CEO that patient's sister had called to inquire. CSW awaiting to hear back from Sue Lush to see if they can accommodate patient with a wound vac. York Spaniel MSW,LCSW [phone]

## 2017-04-14 NOTE — Anesthesia Procedure Notes (Signed)
Procedure Name: Intubation Date/Time: 04/14/2017 11:04 AM Performed by: Jonna Clark Pre-anesthesia Checklist: Patient identified, Patient being monitored, Timeout performed, Emergency Drugs available and Suction available Patient Re-evaluated:Patient Re-evaluated prior to induction Oxygen Delivery Method: Circle system utilized Preoxygenation: Pre-oxygenation with 100% oxygen Induction Type: IV induction Ventilation: Mask ventilation without difficulty Laryngoscope Size: Mac and 3 Grade View: Grade I Tube type: Oral Tube size: 7.0 mm Number of attempts: 1 Airway Equipment and Method: Stylet Placement Confirmation: ETT inserted through vocal cords under direct vision,  positive ETCO2 and breath sounds checked- equal and bilateral Secured at: 21 cm Tube secured with: Tape Dental Injury: Teeth and Oropharynx as per pre-operative assessment

## 2017-04-14 NOTE — Clinical Social Work Note (Signed)
CSW updated patient's brother about the potential of a bed offer from [facility] but that Sue Lush has to get it approved through her CEO. If it gets approved I also informed him that the bed is a semi-private with a dementia patient who sometimes yells out. York Spaniel MSW,LCSW [phone]

## 2017-04-14 NOTE — Care Management Important Message (Signed)
Important Message  Patient Details  Name: Jesus Morton MRN: [id number] Date of Birth: 02-Mar-1956   Medicare Important Message Given:  Yes    Chapman Fitch, RN 04/14/2017, 11:03 AM

## 2017-04-14 NOTE — OR Nursing (Signed)
Family states pt will be scared of SCD and they do not want to use them

## 2017-04-14 NOTE — Care Management (Signed)
RNCM spoke with Jesus Morton. Patient was declined for [facility] transfer.  This was not an insurance denial.  [facility] has declined to accept patient.  I have updated Dr. Excell Seltzer, and POA Francis Dowse.  Plan to proceed with debridement today.

## 2017-04-14 NOTE — Progress Notes (Signed)
Slow to wake up  Moans at times   Dr Priscella Mann to check pt   Vital signs stable

## 2017-04-14 NOTE — Op Note (Signed)
04/10/2017 - 04/14/2017  11:47 AM  PATIENT:  Jesus Morton  61 y.o. male  PRE-OPERATIVE DIAGNOSIS:  Decubitus ulcer  POST-OPERATIVE DIAGNOSIS: Decubitus ulcer  PROCEDURE: Debridement of sacral decubitus ulcer and wound VAC placement  SURGEON:  Lattie Haw MD, FACS  ANESTHESIA:   Gen. with endotracheal tube   Details of Procedure: This patient with a sacral decubitus ulcer. After obtaining informed consent the patient was taken the operating room where he was induced general anesthesia and placed in a well padded prone position. A surgical pause was held and the patient was prepped draped sterile fashion.  The wound was inspected and found to contain a large piece of necrotic tissue which was excised using sharp dissection with scissors and this was sent off for examination. Additional small pieces of necrotic tissue were excised as well in a similar fashion and sent off. Granulation tissue was identified and debridement of the granulation tissue roughly with a laparotomy pad was performed. Once a good bleeding was obtained an area of undermining was noted. Coker clamps were placed and then electrocautery incision was made cephalad to open this undermined area up. No skin loss was encountered.  Once assuring that hemostasis was adequate the wound was measured at 7 x 4 cm. It was deep to muscle. A wound VAC was placed in a standard fashion and placed to suction with good results and no leakage.  Patient was taken to recovery room in stable condition to be admitted for continued care sponge lap needle count was correct   Lattie Haw, MD FACS

## 2017-04-14 NOTE — Progress Notes (Signed)
Very slow to wake up  resp shallow but chest sounds clear  Dr Priscella Mann called no new orders   Pt stable

## 2017-04-14 NOTE — Anesthesia Preprocedure Evaluation (Signed)
Anesthesia Evaluation  Patient identified by MRN, date of birth, ID band Patient awake    Reviewed: Allergy & Precautions, NPO status , Patient's Chart, lab work & pertinent test results  History of Anesthesia Complications Negative for: history of anesthetic complications  Airway Mallampati: II  TM Distance: >3 FB Neck ROM: Full    Dental  (+) Poor Dentition, Missing   Pulmonary neg sleep apnea, neg COPD,    breath sounds clear to auscultation- rhonchi (-) wheezing      Cardiovascular (-) hypertension(-) CAD, (-) Past MI and (-) Cardiac Stents  Rhythm:Regular Rate:Normal - Systolic murmurs and - Diastolic murmurs    Neuro/Psych Down syndrome, dementia    GI/Hepatic Neg liver ROS, GERD  ,  Endo/Other  negative endocrine ROSneg diabetes  Renal/GU negative Renal ROS     Musculoskeletal negative musculoskeletal ROS (+)   Abdominal (+) - obese,   Peds  Hematology  (+) anemia ,   Anesthesia Other Findings Past Medical History: No date: Anemia No date: Dementia No date: Down syndrome No date: GERD (gastroesophageal reflux disease) No date: Osteoporosis   Reproductive/Obstetrics                             Anesthesia Physical Anesthesia Plan  ASA: III  Anesthesia Plan: General   Post-op Pain Management:    Induction: Intravenous  PONV Risk Score and Plan: 1 and Ondansetron and Dexamethasone  Airway Management Planned: Oral ETT  Additional Equipment:   Intra-op Plan:   Post-operative Plan: Extubation in OR  Informed Consent: I have reviewed the patients History and Physical, chart, labs and discussed the procedure including the risks, benefits and alternatives for the proposed anesthesia with the patient or authorized representative who has indicated his/her understanding and acceptance.   Dental advisory given  Plan Discussed with: CRNA and Anesthesiologist  Anesthesia  Plan Comments:         Anesthesia Quick Evaluation

## 2017-04-14 NOTE — Progress Notes (Signed)
[facility] - [place] at [facility]   PATIENT NAME: Jesus Morton    MR#:  [id number]  DATE OF BIRTH:  04/01/1956  SUBJECTIVE:  CHIEF COMPLAINT:   Chief Complaint  Patient presents with  . Wound Infection   Patient awaiting debridement  REVIEW OF SYSTEMS:   Pt is not able to give ROS.  ROS  DRUG ALLERGIES:  No Known Allergies  VITALS:  Blood pressure 109/89, pulse 69, temperature 97.7 F (36.5 C), temperature source Oral, resp. rate 16, height 5' (1.524 m), weight 107 lb (48.5 kg), SpO2 100 %.  PHYSICAL EXAMINATION:   Constitutional: He appears well-developed and well-nourished. No distress.  HENT:  Head: Normocephalic and atraumatic.  Mouth/Throat: Oropharynx is clear and moist. Mucous membranes are dry.  Eyes: Pupils are equal, round, and reactive to light. Conjunctivae and EOM are normal. No scleral icterus.  Neck: Normal range of motion. Neck supple. No JVD present. No tracheal deviation present. No thyromegaly present.  Cardiovascular: Normal rate, regular rhythm and normal heart sounds.  Exam reveals no gallop and no friction rub.   No murmur heard. Respiratory: Effort normal and breath sounds normal. No respiratory distress.  GI: Soft. Bowel sounds are normal. He exhibits no distension. There is no tenderness.  Genitourinary:  Genitourinary Comments: Deferred  Musculoskeletal: Normal range of motion. He exhibits no edema.  Lymphadenopathy:    He has no cervical adenopathy.  Neurological: He is alert. No cranial nerve deficit.  The patient has not consistently verbal thus it is difficult to assess orientation  Skin: Skin is warm and dry. No rash noted. No erythema.  Decubitus ulcer sacrum packed  Psychiatric: He has a normal mood and affect. His behavior is normal. Judgment and thought content normal.   Physical Exam LABORATORY PANEL:   CBC  Recent Labs Lab 04/13/17 0525  WBC 5.8  HGB 13.2  HCT 37.9*  PLT 306    ------------------------------------------------------------------------------------------------------------------  Chemistries   Recent Labs Lab 04/10/17 1648 04/13/17 0525  NA 139 140  K 3.9 3.9  CL 99* 107  CO2 29 28  GLUCOSE 102* 100*  BUN 20 11  CREATININE 0.71 0.71  CALCIUM 8.7* 8.0*  AST 31  --   ALT 19  --   ALKPHOS 95  --   BILITOT 0.4  --    ------------------------------------------------------------------------------------------------------------------  Cardiac Enzymes No results for input(s): TROPONINI in the last 168 hours. ------------------------------------------------------------------------------------------------------------------  RADIOLOGY:  No results found.  ASSESSMENT AND PLAN:   Active Problems:   Decubitus ulcer of sacral region, stage 4 (HCC)  1. Decubitus ulcer: Reportedly stage IV;  Plan for debridement later today LTAC has refers the patient 2. Dehydration: Continue IV fluids 3. GERD: The patient has history of aspiration pneumonia. He is on a pured diet. Continue PPI. 4. Dementia: Superimposed upon Down syndrome. Continue Namenda. Ativan for agitation.  5. DVT prophylaxis: SCDs 6. GI prophylaxis: As above   All the records are reviewed and case discussed with Care Management/Social Workerr. Management plans discussed with the patient, family and they are in agreement.  CODE STATUS: partial.  TOTAL TIME TAKING CARE OF THIS PATIENT: 25 minutes.   POSSIBLE D/C IN 1-2 DAYS, DEPENDING ON CLINICAL CONDITION.   Jesus Morton M.D on 04/14/2017   Between 7am to 6pm - Pager - [phone]  After 6pm go to [email or web address] - password [employer]  [facility] [place] [facility]  Office  [phone]  CC: Primary care physician; Jesus Morton, Jesus Spillers, MD  Note: This dictation  was prepared with Dragon dictation along with smaller phrase technology. Any transcriptional errors that result from this process are unintentional.

## 2017-04-14 NOTE — NC FL2 (Signed)
[place] MEDICAID FL2 LEVEL OF CARE SCREENING TOOL     IDENTIFICATION  Patient Name: Jesus Morton Birthdate: [birth year] Sex: male Admission Date (Current Location): 04/10/2017  [facility] and [place] Number:  Chiropodist and Address:  [facility], [address], [place], [place] [address]      Provider Number: [id number]  Attending Physician Name and Address:  Auburn Bilberry, MD  Relative Name and Phone Number:       Current Level of Care: Hospital Recommended Level of Care: Skilled Nursing Facility Prior Approval Number:    Date Approved/Denied:   PASRR Number:    Discharge Plan: SNF    Current Diagnoses: Patient Active Problem List   Diagnosis Date Noted  . Decubitus ulcer of sacral region, stage 4 (HCC) 04/10/2017  . Macrocytosis 11/02/2016  . Dementia 07/19/2016  . Down syndrome 07/19/2016  . Foreign body alimentary tract 06/22/2016  . Abnormal findings on esophagogastroduodenoscopy (EGD) 06/22/2016  . Hemorrhagic esophagitis 06/22/2016  . Hypotension 06/22/2016  . Aspiration pneumonia (HCC) 06/22/2016  . Foreign body aspiration   . Cough   . Pneumonitis due to food and vomit (HCC)   . Problems with swallowing and mastication   . Aspiration of foreign body   . Aspiration pneumonia of right lower lobe (HCC) 06/20/2016  . Cellulitis of right hand 12/24/2015  . Reflux esophagitis 03/10/2014  . Hematemesis 02/16/2014  . Inguinal hernia 12/18/2013  . Cellulitis of right arm 11/28/2013  . Multiple facial fractures (HCC) 03/05/2013  . GERD (gastroesophageal reflux disease) 01/28/2013  . Dysphagia 05/31/2012  . Cellulitis and abscess of leg 06/21/2011  . Corns and callosity 04/26/2011  . Onychomycosis due to dermatophyte 04/26/2011  . Osteoporosis 04/29/2010    Orientation RESPIRATION BLADDER Height & Weight     Self, Place  Normal Incontinent Weight: 107 lb (48.5 kg) Height:  5' (152.4 cm)  BEHAVIORAL SYMPTOMS/MOOD  NEUROLOGICAL BOWEL NUTRITION STATUS   (none)  (none) Incontinent Diet (currently npo for surgical debridement of sacral wound but to be advanced)  AMBULATORY STATUS COMMUNICATION OF NEEDS Skin   Total Care Verbally Normal, PU Stage and Appropriate Care                       Personal Care Assistance Level of Assistance  Total care       Total Care Assistance: Maximum assistance   Functional Limitations Info   (no issues)          SPECIAL CARE FACTORS FREQUENCY  PT (By licensed PT)                    Contractures Contractures Info: Not present    Additional Factors Info  Code Status, Allergies Code Status Info: partial Allergies Info: nka           Current Medications (04/14/2017):  This is the current hospital active medication list Current Facility-Administered Medications  Medication Dose Route Frequency Provider Last Rate Last Dose  . 0.9 %  sodium chloride infusion   Intravenous Continuous Arnaldo Natal, MD 100 mL/hr at 04/14/17 0840    . [MAR Hold] acetaminophen (TYLENOL) tablet 650 mg  650 mg Oral Q6H PRN Arnaldo Natal, MD       Or  . Mitzi Hansen Hold] acetaminophen (TYLENOL) suppository 650 mg  650 mg Rectal Q6H PRN Arnaldo Natal, MD      . Mitzi Hansen Hold] acetaminophen (TYLENOL) tablet 1,000 mg  1,000 mg Oral TID  Arnaldo Natal, MD   Stopped at 04/14/17 1011  . [MAR Hold] acidophilus (RISAQUAD) capsule 1 capsule  1 capsule Oral Daily Arnaldo Natal, MD   Stopped at 04/14/17 1011  . [MAR Hold] artificial tears (LACRILUBE) ophthalmic ointment   Both Eyes QHS Arnaldo Natal, MD      . Mitzi Hansen Hold] calcium carbonate (TUMS - dosed in mg elemental calcium) chewable tablet 500 mg  500 mg Oral BID WC Arnaldo Natal, MD   Stopped at 04/14/17 [phone]  . [MAR Hold] chlorhexidine (PERIDEX) 0.12 % solution 15 mL  15 mL Mouth Rinse BID Auburn Bilberry, MD   Stopped at 04/14/17 1011  . [MAR Hold] cholecalciferol (VITAMIN D) tablet 1,000 Units  1,000  Units Oral Daily Arnaldo Natal, MD   Stopped at 04/14/17 1011  . [MAR Hold] dextromethorphan (DELSYM) 30 MG/5ML liquid 30 mg  30 mg Oral BID Arnaldo Natal, MD   Stopped at 04/14/17 1011  . [MAR Hold] docusate (COLACE) 50 MG/5ML liquid 100 mg  100 mg Oral BID Auburn Bilberry, MD   Stopped at 04/14/17 1011  . [MAR Hold] feeding supplement (ENSURE ENLIVE) (ENSURE ENLIVE) liquid 237 mL  237 mL Oral BID BM Auburn Bilberry, MD   Stopped at 04/14/17 1011  . [MAR Hold] guaiFENesin (ROBITUSSIN) 100 MG/5ML solution 100 mg  5 mL Oral Q4H PRN Auburn Bilberry, MD      . Mitzi Hansen Hold] loratadine (CLARITIN) tablet 10 mg  10 mg Oral Daily PRN Arnaldo Natal, MD      . Mitzi Hansen Hold] LORazepam (ATIVAN) tablet 0.5 mg  0.5 mg Oral Daily PRN Arnaldo Natal, MD   0.5 mg at 04/14/17 1017  . [facility] Hold] MEDLINE mouth rinse  15 mL Mouth Rinse q12n4p Auburn Bilberry, MD      . Mitzi Hansen Hold] Melatonin TABS 5 mg  5 mg Oral Daily Arnaldo Natal, MD   Stopped at 04/13/17 [phone]  . [MAR Hold] memantine (NAMENDA) tablet 10 mg  10 mg Oral BID Arnaldo Natal, MD   Stopped at 04/14/17 1011  . [MAR Hold] morphine 2 MG/ML injection 1-2 mg  1-2 mg Intravenous Q4H PRN Arnaldo Natal, MD   1 mg at 04/14/17 0459  . [MAR Hold] multivitamin with minerals tablet 1 tablet  1 tablet Oral Daily Arnaldo Natal, MD   Stopped at 04/14/17 1011  . [MAR Hold] Naftifine HCl 1 % GEL 1 application  1 application Apply externally BID PRN Arnaldo Natal, MD      . Mitzi Hansen Hold] nystatin-triamcinolone [facility]) cream 1 application  1 application Topical BID PRN Arnaldo Natal, MD      . Mitzi Hansen Hold] ondansetron [facility]) tablet 4 mg  4 mg Oral Q6H PRN Arnaldo Natal, MD       Or  . Mitzi Hansen Hold] ondansetron [facility]) injection 4 mg  4 mg Intravenous Q6H PRN Arnaldo Natal, MD      . Mitzi Hansen Hold] pantoprazole (PROTONIX) EC tablet 40 mg  40 mg Oral Daily Arnaldo Natal, MD   Stopped at 04/14/17 1011  . [MAR Hold] polyethylene  glycol (MIRALAX / GLYCOLAX) packet 17 g  17 g Oral Daily PRN Arnaldo Natal, MD      . Mitzi Hansen Hold] polyethylene glycol (MIRALAX / GLYCOLAX) packet 17 g  17 g Oral Daily Auburn Bilberry, MD   Stopped at 04/14/17 1011  . [MAR Hold] polyvinyl alcohol (LIQUIFILM TEARS) 1.4 % ophthalmic  solution 1 drop  1 drop Both Eyes Q2H PRN Arnaldo Natal, MD      . Mitzi Hansen Hold] sodium chloride (OCEAN) 0.65 % nasal spray 1 spray  1 spray Each Nare PRN Arnaldo Natal, MD      . Mitzi Hansen Hold] sucralfate (CARAFATE) 1 GM/10ML suspension 1 g  1 g Oral TID WC & HS Arnaldo Natal, MD   Stopped at 04/14/17 [phone]  . [MAR Hold] vitamin B-12 (CYANOCOBALAMIN) tablet 1,000 mcg  1,000 mcg Oral Daily Arnaldo Natal, MD   Stopped at 04/14/17 1011  . [MAR Hold] vitamin C (ASCORBIC ACID) tablet 500 mg  500 mg Oral Daily Auburn Bilberry, MD   Stopped at 04/14/17 1011  . [MAR Hold] zinc sulfate capsule 220 mg  220 mg Oral Daily Auburn Bilberry, MD   Stopped at 04/14/17 1011     Discharge Medications: Please see discharge summary for a list of discharge medications.  Relevant Imaging Results:  Relevant Lab Results:   Additional Information ss: [id number]  York Spaniel, LCSW

## 2017-04-14 NOTE — Anesthesia Postprocedure Evaluation (Signed)
Anesthesia Post Note  Patient: Jesus Morton  Procedure(s) Performed: DEBRIDMENT OF DECUBITUS ULCER (N/A )  Patient location during evaluation: PACU Anesthesia Type: General Level of consciousness: awake and alert Pain management: pain level controlled Vital Signs Assessment: post-procedure vital signs reviewed and stable Respiratory status: spontaneous breathing, nonlabored ventilation and respiratory function stable Cardiovascular status: blood pressure returned to baseline and stable Postop Assessment: no signs of nausea or vomiting Anesthetic complications: no     Last Vitals:  Vitals:   04/14/17 1310 04/14/17 1358  BP: (!) 123/58 109/89  Pulse: (!) 58 69  Resp: 12 16  Temp: (!) 36.4 C 36.5 C  SpO2: 100% 100%    Last Pain:  Vitals:   04/14/17 1358  TempSrc: Oral  PainSc:                  Kristeen Lantz

## 2017-04-14 NOTE — Anesthesia Post-op Follow-up Note (Signed)
Anesthesia QCDR form completed.        

## 2017-04-14 NOTE — Transfer of Care (Signed)
Immediate Anesthesia Transfer of Care Note  Patient: Jesus Morton  Procedure(s) Performed: DEBRIDMENT OF DECUBITUS ULCER (N/A )  Patient Location: PACU  Anesthesia Type:General  Level of Consciousness: sedated and responds to stimulation  Airway & Oxygen Therapy: Patient Spontanous Breathing and Patient connected to face mask oxygen  Post-op Assessment: Report given to RN and Post -op Vital signs reviewed and stable  Post vital signs: Reviewed and stable  Last Vitals:  Vitals:   04/14/17 1032 04/14/17 1150  BP: (!) 109/54 114/64  Pulse: (!) 56 62  Resp: 18 16  Temp:    SpO2: 100% 100%    Last Pain:  Vitals:   04/14/17 0414  TempSrc: Oral  PainSc:          Complications: No apparent anesthesia complications

## 2017-04-15 NOTE — Progress Notes (Signed)
[employer] [facility] [place] [facility]   PATIENT NAME: Jesus Morton    MR#:  [id number]  DATE OF BIRTH:  09-17-1955  SUBJECTIVE:  CHIEF COMPLAINT:   Chief Complaint  Patient presents with  . Wound Infection   Pt had wound vac in place  REVIEW OF SYSTEMS:   Pt is not able to give ROS.  ROS  DRUG ALLERGIES:  No Known Allergies  VITALS:  Blood pressure (!) 114/56, pulse (!) 59, temperature 98.6 F (37 C), temperature source Oral, resp. rate 17, height 5' (1.524 m), weight 107 lb (48.5 kg), SpO2 99 %.  PHYSICAL EXAMINATION:   Constitutional: He appears well-developed and well-nourished. No distress.  HENT:  Head: Normocephalic and atraumatic.  Mouth/Throat: Oropharynx is clear and moist. Mucous membranes are .  Eyes: Pupils are equal, round, and reactive to light. Conjunctivae and EOM are normal. No scleral icterus.  Neck: Normal range of motion. Neck supple. No JVD present. No tracheal deviation present. No thyromegaly present.  Cardiovascular: Normal rate, regular rhythm and normal heart sounds.  Exam reveals no gallop and no friction rub.   No murmur heard. Respiratory: Effort normal and breath sounds normal. No respiratory distress.  GI: Soft. Bowel sounds are normal. He exhibits no distension. There is no tenderness.  Genitourinary:  Genitourinary Comments: Deferred  Musculoskeletal: Normal range of motion. He exhibits no edema.  Lymphadenopathy:    He has no cervical adenopathy.  Neurological: He is alert. No cranial nerve deficit.  The patient has not consistently verbal thus it is difficult to assess orientation  Skin: Skin is warm and dry. No rash noted. No erythema.  Wound vac in place Psychiatric: He has a normal mood and affect. His behavior is normal. Judgment and thought content normal.   Physical Exam LABORATORY PANEL:   CBC  Recent Labs Lab 04/13/17 0525  WBC 5.8  HGB 13.2  HCT 37.9*  PLT 306    ------------------------------------------------------------------------------------------------------------------  Chemistries   Recent Labs Lab 04/10/17 1648 04/13/17 0525  NA 139 140  K 3.9 3.9  CL 99* 107  CO2 29 28  GLUCOSE 102* 100*  BUN 20 11  CREATININE 0.71 0.71  CALCIUM 8.7* 8.0*  AST 31  --   ALT 19  --   ALKPHOS 95  --   BILITOT 0.4  --    ------------------------------------------------------------------------------------------------------------------  Cardiac Enzymes No results for input(s): TROPONINI in the last 168 hours. ------------------------------------------------------------------------------------------------------------------  RADIOLOGY:  No results found.  ASSESSMENT AND PLAN:   Active Problems:   Decubitus ulcer of sacral region, stage 4 (HCC)  1. Decubitus ulcer: Reportedly stage IV;  S/p wound vac placment and surgery has filled forms plastic surgery at [facility] 2. Dehydration: Continue IV fluids 3. GERD: The patient has history of aspiration pneumonia. He is on a pured diet. Continue PPI. 4. Dementia: Superimposed upon Down syndrome. Continue Namenda. Ativan for agitation.  5. DVT prophylaxis: SCDs 6. GI prophylaxis: As above   All the records are reviewed and case discussed with Care Management/Social Workerr. Management plans discussed with the patient, family and they are in agreement.  CODE STATUS: partial.  TOTAL TIME TAKING CARE OF THIS PATIENT: 25 minutes.   POSSIBLE D/C IN 1-2 DAYS, DEPENDING ON CLINICAL CONDITION.   Auburn Bilberry M.D on 04/15/2017   Between 7am to 6pm - Pager - [phone]  After 6pm go to [email or web address] - Social research officer, government  [facility] [place] [facility]  Office  [phone]  CC: Primary care physician; McLean-Scocuzza,  Pasty Spillers, MD  Note: This dictation was prepared with Dragon dictation along with smaller phrase technology. Any transcriptional errors that result from this process are  unintentional.

## 2017-04-15 NOTE — Progress Notes (Signed)
No problems with wound VAC today. Discussed with brother and sister. I filled out the referral to plastic surgery at [facility] for possible flap repair at some point. Placement plans in progress. Could be placed at any time.

## 2017-04-15 NOTE — Progress Notes (Signed)
Clinical referral form returned to pts family for family to complete form.

## 2017-04-16 LAB — AEROBIC CULTURE  (SUPERFICIAL SPECIMEN)

## 2017-04-16 LAB — GLUCOSE, CAPILLARY: Glucose-Capillary: 91 mg/dL (ref 65–99)

## 2017-04-16 LAB — AEROBIC CULTURE W GRAM STAIN (SUPERFICIAL SPECIMEN)

## 2017-04-16 NOTE — Clinical Social Work Note (Signed)
Late entry from 10/7-CSW spoke with all of patient's siblings today in patient's room. CSW spent much time with family answering questions and providing supportive listening. CSW extended the available bed offers. Patient's siblings are looking at [facility] and [facility] CSW informed them that [facility] has not yet responded. Will follow up with family for their decision tomorrow morning. York Spaniel MSW,LCSW [phone]

## 2017-04-16 NOTE — Progress Notes (Signed)
[employer] [facility] [place] at [facility]   PATIENT NAME: Jesus Morton    MR#:  [id number]  DATE OF BIRTH:  09/16/1955  SUBJECTIVE:  CHIEF COMPLAINT:   Chief Complaint  Patient presents with  . Wound Infection   No complaints waiting placement  REVIEW OF SYSTEMS:   Pt is not able to give ROS.  ROS  DRUG ALLERGIES:  No Known Allergies  VITALS:  Blood pressure (!) 115/47, pulse 69, temperature 99 F (37.2 C), temperature source Oral, resp. rate 16, height 5' (1.524 m), weight 107 lb (48.5 kg), SpO2 100 %.  PHYSICAL EXAMINATION:   Constitutional: He appears well-developed and well-nourished. No distress.  HENT:  Head: Normocephalic and atraumatic.  Mouth/Throat: Oropharynx is clear and moist. Mucous membranes are .  Eyes: Pupils are equal, round, and reactive to light. Conjunctivae and EOM are normal. No scleral icterus.  Neck: Normal range of motion. Neck supple. No JVD present. No tracheal deviation present. No thyromegaly present.  Cardiovascular: Normal rate, regular rhythm and normal heart sounds.  Exam reveals no gallop and no friction rub.   No murmur heard. Respiratory: Effort normal and breath sounds normal. No respiratory distress.  GI: Soft. Bowel sounds are normal. He exhibits no distension. There is no tenderness.  Genitourinary:  Genitourinary Comments: Deferred  Musculoskeletal: Normal range of motion. He exhibits no edema.  Lymphadenopathy:    He has no cervical adenopathy.  Neurological: He is alert. No cranial nerve deficit.  Not oriented to place or time Skin: Skin is warm and dry. No rash noted. No erythema.  Wound vac in place Psychiatric: He has a normal mood and affect. His behavior is normal. Judgment and thought content normal.   Physical Exam LABORATORY PANEL:   CBC  Recent Labs Lab 04/13/17 0525  WBC 5.8  HGB 13.2  HCT 37.9*  PLT 306    ------------------------------------------------------------------------------------------------------------------  Chemistries   Recent Labs Lab 04/10/17 1648 04/13/17 0525  NA 139 140  K 3.9 3.9  CL 99* 107  CO2 29 28  GLUCOSE 102* 100*  BUN 20 11  CREATININE 0.71 0.71  CALCIUM 8.7* 8.0*  AST 31  --   ALT 19  --   ALKPHOS 95  --   BILITOT 0.4  --    ------------------------------------------------------------------------------------------------------------------  Cardiac Enzymes No results for input(s): TROPONINI in the last 168 hours. ------------------------------------------------------------------------------------------------------------------  RADIOLOGY:  No results found.  ASSESSMENT AND PLAN:   Active Problems:   Decubitus ulcer of sacral region, stage 4 (HCC)  1. Decubitus ulcer: Reportedly stage IV;  S/p wound vac placment and surgery has filled forms for plastic surgery referral at [facility] 2. Dehydration: Continue IV fluids 3. GERD: The patient has history of aspiration pneumonia. He is on a pured diet. Continue PPI. 4. Dementia: Superimposed upon Down syndrome. Continue Namenda. Ativan for agitation.  5. DVT prophylaxis: SCDs 6. GI prophylaxis: As above   All the records are reviewed and case discussed with Care Management/Social Workerr. Management plans discussed with the patient, family and they are in agreement.  CODE STATUS: partial.  TOTAL TIME TAKING CARE OF THIS PATIENT: 25 minutes.   POSSIBLE D/C IN 1-2 DAYS, DEPENDING ON CLINICAL CONDITION.   Auburn Bilberry M.D on 04/16/2017   Between 7am to 6pm - Pager - [phone]  After 6pm go to [email or web address] - password [email or web address]  [facility] [place] [facility]  Office  [phone]  CC: Primary care physician; McLean-Scocuzza, Pasty Spillers, MD  Note: This dictation was prepared  with Dragon dictation along with smaller phrase technology. Any transcriptional errors that result from this process  are unintentional.

## 2017-04-17 ENCOUNTER — Ambulatory Visit: Payer: Self-pay | Admitting: Surgery

## 2017-04-17 LAB — SURGICAL PATHOLOGY

## 2017-04-17 MED ORDER — ASCORBIC ACID 500 MG PO TABS: 500.0000 mg | ORAL_TABLET | Freq: Every day | ORAL | Status: AC

## 2017-04-17 MED ORDER — LORAZEPAM 0.5 MG PO TABS: 0.5000 mg | ORAL_TABLET | Freq: Every day | ORAL | 0 refills | Status: AC | PRN

## 2017-04-17 MED ORDER — ENSURE ENLIVE PO LIQD
237.0000 mL | Freq: Two times a day (BID) | ORAL | 12 refills | Status: AC
Start: 2017-04-17 — End: ?

## 2017-04-17 MED ORDER — DOCUSATE SODIUM 50 MG/5ML PO LIQD: 100.0000 mg | Freq: Two times a day (BID) | ORAL | 0 refills | Status: AC

## 2017-04-17 MED ORDER — ZINC SULFATE 220 (50 ZN) MG PO CAPS: 220.0000 mg | ORAL_CAPSULE | Freq: Every day | ORAL | Status: AC

## 2017-04-17 NOTE — Progress Notes (Signed)
Pt. Discharged today. IV removed. Discharge plan reviewed with family. Wound VAC disconnected for transport. VSS. Pt. Transported [facility] to [facility]. Discharged paper work sent with EMS.

## 2017-04-17 NOTE — Discharge Summary (Signed)
[facility] - [place] at [facility], [place] y.o., DOB 1955/12/15, MRN [id number]. Admission date: 04/10/2017 Discharge Date 04/17/2017 Primary MD McLean-Scocuzza, Pasty Spillers, MD Admitting Physician Arnaldo Natal, MD  Admission Diagnosis  Wound infection [T14.8XXA, L08.9]  Discharge Diagnosis   Active Problems:   Decubitus ulcer of sacral region, stage 4 (HCC) none infected   Dehydration   GERD    Dementia   Down syndrome    Osteoporosis          Hospital Course  The patient with past medical history of Down syndrome and dementia presents to the emergency department from wound clinic due to worsening decubitus ulcer. The patient reportedly has eschar as well as necrotic tissue of the wound. Patient had no sign of infection. He was seen in consultation by surgery who debrided the wound and placed a wound VAC. Patient also was referred to [facility] plastic surgery by surgery for further evaluation. Patient currently stable we'll need the wound VAC in place. Patient's wound did have some bacteria noted however this is culture is patient. He has no fevers no WBC count surgical evaluation felt that he had no infection.           Consults  general surgery  Significant Tests:  See full reports for all details     Dg Sacrum/coccyx  Result Date: 04/10/2017 CLINICAL DATA:  Sacral wound. EXAM: SACRUM AND COCCYX - 2+ VIEW COMPARISON:  10/01/2016 FINDINGS: Diffuse osteopenia. Mild symmetric degenerative change of the hips unchanged. Mild degenerate change of the symphysis pubis joint. Mild degenerate change of the spine. No definite air in the soft tissues. Mild fecal retention over the rectosigmoid colon. IMPRESSION: No acute findings. Degenerative changes as described. Electronically Signed   By: Elberta Fortis M.D.   On: 04/10/2017 19:24       Today   Subjective:   Jesus Morton  Patient currently has no symptoms  Objective:   Blood pressure (!)  105/54, pulse 71, temperature 98.7 F (37.1 C), temperature source Oral, resp. rate 16, height 5' (1.524 m), weight 109 lb (49.4 kg), SpO2 100 %.  .  Intake/Output Summary (Last 24 hours) at 04/17/17 0856 Last data filed at 04/17/17 0700  Gross per 24 hour  Intake          1588.33 ml  Output                0 ml  Net          1588.33 ml    Exam VITAL SIGNS: Blood pressure (!) 105/54, pulse 71, temperature 98.7 F (37.1 C), temperature source Oral, resp. rate 16, height 5' (1.524 m), weight 109 lb (49.4 kg), SpO2 100 %.  GENERAL:  61 y.o.-year-old patient lying in the bed with no acute distress.  EYES: Pupils equal, round, reactive to light and accommodation. No scleral icterus. Extraocular muscles intact.  HEENT: Head atraumatic, normocephalic. Oropharynx and nasopharynx clear.  NECK:  Supple, no jugular venous distention. No thyroid enlargement, no tenderness.  LUNGS: Normal breath sounds bilaterally, no wheezing, rales,rhonchi or crepitation. No use of accessory muscles of respiration.  CARDIOVASCULAR: S1, S2 normal. No murmurs, rubs, or gallops.  ABDOMEN: Soft, nontender, nondistended. Bowel sounds present. No organomegaly or mass.  EXTREMITIES: No pedal edema, cyanosis, or clubbing.  NEUROLOGIC: Cranial nerves II through XII are intact. Muscle strength 5/5 in all extremities. Sensation intact. Gait not checked.  PSYCHIATRIC: The patient is alert and oriented x 3.  SKIN:  No obvious rash, lesion, or ulcer. Present decubitus ulcer with wound vac in place Data Review     CBC w Diff: Lab Results  Component Value Date   WBC 5.8 04/13/2017   HGB 13.2 04/13/2017   HGB 14.0 11/13/2012   HCT 37.9 (L) 04/13/2017   HCT 40.7 11/13/2012   PLT 306 04/13/2017   PLT 242 11/13/2012   LYMPHOPCT 12 04/10/2017   LYMPHOPCT 2.7 11/13/2012   MONOPCT 8 04/10/2017   MONOPCT 1.5 11/13/2012   EOSPCT 1 04/10/2017   EOSPCT 0.3 11/13/2012   BASOPCT 2 04/10/2017   BASOPCT 0.1 11/13/2012   CMP: Lab  Results  Component Value Date   NA 140 04/13/2017   NA 137 11/13/2012   K 3.9 04/13/2017   K 3.9 11/13/2012   CL 107 04/13/2017   CL 103 11/13/2012   CO2 28 04/13/2017   CO2 29 11/13/2012   BUN 11 04/13/2017   BUN 15 11/13/2012   CREATININE 0.71 04/13/2017   CREATININE 0.86 11/13/2012   PROT 6.5 04/10/2017   PROT 7.2 11/13/2012   ALBUMIN 2.9 (L) 04/10/2017   ALBUMIN 2.7 (L) 11/13/2012   BILITOT 0.4 04/10/2017   BILITOT 0.4 11/13/2012   ALKPHOS 95 04/10/2017   ALKPHOS 99 11/13/2012   AST 31 04/10/2017   AST 30 11/13/2012   ALT 19 04/10/2017   ALT 19 11/13/2012  .  Micro Results Recent Results (from the past 240 hour(s))  Wound or Superficial Culture     Status: Abnormal   Collection Time: 04/10/17  7:00 PM  Result Value Ref Range Status   Specimen Description ARM  Final   Special Requests NONE  Final   Gram Stain   Final    FEW WBC PRESENT, PREDOMINANTLY PMN MODERATE GRAM POSITIVE COCCI FEW GRAM POSITIVE RODS FEW GRAM NEGATIVE RODS    Culture (A)  Final    MULTIPLE ORGANISMS PRESENT, NONE PREDOMINANT NO GROUP A STREP (S.PYOGENES) ISOLATED NO STAPHYLOCOCCUS AUREUS ISOLATED Performed at [facility], [address], [place], [place] [address]    Report Status 04/16/2017 FINAL  Final  MRSA PCR Screening     Status: None   Collection Time: 04/11/17  5:08 AM  Result Value Ref Range Status   MRSA by PCR NEGATIVE NEGATIVE Final    Comment:        The GeneXpert MRSA Assay (FDA approved for NASAL specimens only), is one component of a comprehensive MRSA colonization surveillance program. It is not intended to diagnose MRSA infection nor to guide or monitor treatment for MRSA infections.   Aerobic/Anaerobic Culture (surgical/deep wound)     Status: Abnormal (Preliminary result)   Collection Time: 04/14/17 11:43 AM  Result Value Ref Range Status   Specimen Description ULCER DECUBITIS ULCER  Final   Special Requests NONE  Final   Gram Stain   Final     ABUNDANT WBC PRESENT, PREDOMINANTLY PMN FEW GRAM POSITIVE COCCI IN PAIRS RARE GRAM NEGATIVE RODS Performed at [facility], [address], [place], [place] [address]    Culture MULTIPLE ORGANISMS PRESENT, NONE PREDOMINANT (A)  Final   Report Status PENDING  Incomplete        Code Status Orders        Start     Ordered   04/10/17 2048  Limited resuscitation (code)  Continuous    Question Answer Comment  In the event of cardiac or respiratory ARREST: Initiate Code Blue, Call Rapid Response Yes  In the event of cardiac or respiratory ARREST: Perform CPR Yes   In the event of cardiac or respiratory ARREST: Perform Intubation/Mechanical Ventilation No   In the event of cardiac or respiratory ARREST: Use NIPPV/BiPAp only if indicated Yes   In the event of cardiac or respiratory ARREST: Administer ACLS medications if indicated Yes   In the event of cardiac or respiratory ARREST: Perform Defibrillation or Cardioversion if indicated Yes      04/10/17 2047    Code Status History    Date Active Date Inactive Code Status Order ID Comments User Context   04/10/2017  7:36 PM 04/10/2017  7:36 PM Full Code [id number]  Arnaldo Natal, MD ED   06/20/2016 10:12 PM 06/22/2016  7:15 PM Full Code [id number]  Hugelmeyer, Jon Gills, DO Inpatient            Discharge Medications   Allergies as of 04/17/2017   No Known Allergies     Medication List    STOP taking these medications   amoxicillin-clavulanate 600-42.9 MG/5ML suspension Commonly known as:  AUGMENTIN ES-600   doxycycline 100 MG capsule Commonly known as:  VIBRAMYCIN     TAKE these medications   acetaminophen 500 MG tablet Commonly known as:  TYLENOL Take 1,000 mg by mouth 3 (three) times daily.   ALIGN PO Take 1 capsule by mouth daily.   artificial tears Oint ophthalmic ointment Place into both eyes at bedtime. 0.25 inch strip to both eyes at bedtime   ascorbic acid 500 MG tablet Commonly known as:  VITAMIN  C Take 1 tablet (500 mg total) by mouth daily.   calcium carbonate 600 MG tablet Commonly known as:  OS-CAL Take 600 mg by mouth twice daily.   cholecalciferol 1000 units tablet Commonly known as:  VITAMIN D Take 1,000 Units by mouth daily.   dextromethorphan 30 MG/5ML liquid Commonly known as:  DELSYM Take 5 mLs (30 mg total) by mouth 2 (two) times daily.   docusate 50 MG/5ML liquid Commonly known as:  COLACE Take 10 mLs (100 mg total) by mouth 2 (two) times daily.   feeding supplement (ENSURE ENLIVE) Liqd Take 237 mLs by mouth 2 (two) times daily between meals.   guaiFENesin 600 MG 12 hr tablet Commonly known as:  MUCINEX Take 1 tablet (600 mg total) by mouth 2 (two) times daily.   hydroxypropyl methylcellulose / hypromellose 2.5 % ophthalmic solution Commonly known as:  ISOPTO TEARS / GONIOVISC Place 1 drop into both eyes every 2 (two) hours while awake.   loratadine 10 MG tablet Commonly known as:  CLARITIN Take 10 mg by mouth daily as needed.   LORazepam 0.5 MG tablet Commonly known as:  ATIVAN Take 1 tablet (0.5 mg total) by mouth daily as needed for anxiety.   Melatonin 3 MG Tabs Take 1 tablet by mouth daily.   memantine 10 MG tablet Commonly known as:  NAMENDA Take 10 mg by mouth 2 (two) times daily.   MIRALAX PO Mix 17g in 8 oz of water and drink daily as needed for constipation.   MULTIVITAMIN ADULT Tabs Take one tablet by mouth once daily.   NAFTIN 1 % Gel Generic drug:  Naftifine HCl Apply 1 application topically 2 (two) times daily as needed. Between toes   nystatin-triamcinolone cream Commonly known as:  MYCOLOG II Apply 1 application topically 2 (two) times daily. Affected area of elbox flexor and groin   pantoprazole 40 MG tablet Commonly known as:  PROTONIX Take 1  tablet (40 mg total) by mouth daily.   sodium chloride 0.65 % Soln nasal spray Commonly known as:  OCEAN Place 1 spray into both nostrils as needed for congestion.    sucralfate 1 GM/10ML suspension Commonly known as:  CARAFATE Take 10 mLs (1 g total) by mouth 4 (four) times daily -  with meals and at bedtime.   vitamin B-12 1000 MCG tablet Commonly known as:  CYANOCOBALAMIN Take 1,000 mcg by mouth daily.   zinc sulfate 220 (50 Zn) MG capsule Take 1 capsule (220 mg total) by mouth daily.          Total Time in preparing paper work, data evaluation and todays exam - 35 minutes  Auburn Bilberry M.D on 04/17/2017 at 8:56 AM  [facility]   Office  [phone]

## 2017-04-17 NOTE — Clinical Social Work Placement (Signed)
   CLINICAL SOCIAL WORK PLACEMENT  NOTE  Date:  04/17/2017  Patient Details  Name: Jesus Morton MRN: [id number] Date of Birth: 1955-09-11  Clinical Social Work is seeking post-discharge placement for this patient at the Skilled  Nursing Facility level of care (*CSW will initial, date and re-position this form in  chart as items are completed):  Yes   Patient/family provided with [place] Clinical Social Work Department's list of facilities offering this level of care within the geographic area requested by the patient (or if unable, by the patient's family).  Yes   Patient/family informed of their freedom to choose among providers that offer the needed level of care, that participate in Medicare, Medicaid or managed care program needed by the patient, have an available bed and are willing to accept the patient.  Yes   Patient/family informed of [facility]'s ownership interest in [facility] and [facility], as well as of the fact that they are under no obligation to receive care at these facilities.  PASRR submitted to EDS on 04/10/17     PASRR number received on 04/18/17     Existing PASRR number confirmed on       FL2 transmitted to all facilities in geographic area requested by pt/family on 04/10/17     FL2 transmitted to all facilities within larger geographic area on       Patient informed that his/her managed care company has contracts with or will negotiate with certain facilities, including the following:        Yes   Patient/family informed of bed offers received.  Patient chooses bed at  [facility])     Physician recommends and patient chooses bed at  [facility])    Patient to be transferred to  ([facility]) on 04/17/17.  Patient to be transferred to facility by  (EMS)     Patient family notified on 04/17/17 of transfer.  Name of family member notified:   (patient's sister)     PHYSICIAN       Additional Comment:     _______________________________________________ York Spaniel, LCSW 04/17/2017, 2:03 PM

## 2017-04-17 NOTE — Clinical Social Work Note (Signed)
Patient's siblings have chosen [facility] and patient is to discharge there today. Discharge information has been sent. Nurse to call report. Patient to transport via EMS. Family requested if a family member could ride in the back with patient as he gets scared easily. CSW contacted the shift supervisor: Missy and she approved for a family member to ride with patient to [facility]. York Spaniel MSW,LCSW [phone]

## 2017-04-17 NOTE — Progress Notes (Signed)
RN called EMS for transport. Called Kristen at [facility] and gave report on pt.

## 2017-04-17 NOTE — Care Management Important Message (Addendum)
Important Message  Patient Details  Name: Prem Coykendall MRN: [id number] Date of Birth: 28-Sep-1955   Medicare Important Message Given:  Yes Signed IM notice given to patient's sister    Eber Hong, RN 04/17/2017, 10:21 AM

## 2017-04-17 NOTE — Discharge Instructions (Signed)
[facility] - [place] at [facility]  DIET:  Dysphagia 1 diet  DISCHARGE CONDITION:  Stable  ACTIVITY:  Activity as tolerated  OXYGEN:  Home Oxygen: No.   Oxygen Delivery: room air  DISCHARGE LOCATION:  nursing home    ADDITIONAL DISCHARGE INSTRUCTION: wound vac care   If you experience worsening of your admission symptoms, develop shortness of breath, life threatening emergency, suicidal or homicidal thoughts you must seek medical attention immediately by calling 911 or calling your MD immediately  if symptoms less severe.  You Must read complete instructions/literature along with all the possible adverse reactions/side effects for all the Medicines you take and that have been prescribed to you. Take any new Medicines after you have completely understood and accpet all the possible adverse reactions/side effects.   Please note  You were cared for by a hospitalist during your hospital stay. If you have any questions about your discharge medications or the care you received while you were in the hospital after you are discharged, you can call the unit and asked to speak with the hospitalist on call if the hospitalist that took care of you is not available. Once you are discharged, your primary care physician will handle any further medical issues. Please note that NO REFILLS for any discharge medications will be authorized once you are discharged, as it is imperative that you return to your primary care physician (or establish a relationship with a primary care physician if you do not have one) for your aftercare needs so that they can reassess your need for medications and monitor your lab values.

## 2017-04-19 LAB — AEROBIC/ANAEROBIC CULTURE W GRAM STAIN (SURGICAL/DEEP WOUND)

## 2017-05-02 ENCOUNTER — Telehealth: Payer: Self-pay

## 2017-05-02 NOTE — Telephone Encounter (Signed)
Patient's Brother, Francis DowseJoel called in at this time and states that he is not very happy that he has to wait until 06/26/17 for patient to be seen by [facility] plastic surgery and asked if referral can be sent elsewhere so that this can occur sooner. I explained that we would be glad to send the referrals but typically speaking appointments in plastic surgery for this type of repair are 2-3 months out at all tertiary care facilities. He does not wish to send any additional referrals at this time.  He also asked if patient needs to see Dr. Excell Seltzerooper for follow-up on 05/04/17 since he will be seen at [facility]. Patient is currently a [facility] and has continued on wound vac at facility with dressing changes daily.  Message sent to Dr. Excell Seltzerooper at this time. Awaiting response.

## 2017-05-03 NOTE — Telephone Encounter (Signed)
Patient's Guardian Francis DowseJoel, called in and asked whether he needs to keep tomorrow's appointment. He was told to keep the appointment and I will call if decision changes after speaking with Physician.

## 2017-05-04 ENCOUNTER — Encounter: Payer: Self-pay | Admitting: Surgery

## 2017-05-04 ENCOUNTER — Ambulatory Visit (INDEPENDENT_AMBULATORY_CARE_PROVIDER_SITE_OTHER): Payer: Medicare Other | Admitting: Surgery

## 2017-05-04 VITALS — Ht 60.0 in

## 2017-05-04 DIAGNOSIS — L89154 Pressure ulcer of sacral region, stage 4: Secondary | ICD-10-CM | POA: Diagnosis not present

## 2017-05-04 NOTE — Patient Instructions (Signed)
Continue current wound vac orders. Dressing changed today in office. Canister will need changed once patient arrives back to facility. Consultation was filled out.  Follow-up in 2 weeks in our office. Please see appointment below.

## 2017-05-04 NOTE — Progress Notes (Signed)
Outpatient postop visit  05/04/2017  Jesus Morton is an 61 y.o. male.    Procedure: Debridement of decubitus ulcer with wound VAC placement  CC: Nonverbal  HPI: This patient status post debridement of a decubitus ulcer in the sacrum with wound VAC placement.  Patient is nonverbal and accompanied by family members.  Medications reviewed.    Physical Exam:  There were no vitals taken for this visit.    PE: Nonverbal Sacral decubitus, healing well Wound vac replaced    Assessment/Plan:  Doing well F/u 2 weeks  Lattie Hawichard E Manda Holstad, MD, FACS

## 2017-05-25 ENCOUNTER — Emergency Department
Admission: EM | Admit: 2017-05-25 | Discharge: 2017-05-25 | Disposition: A | Discharge status: Home / Self Care | Payer: Medicare Other | Attending: Emergency Medicine | Admitting: Emergency Medicine

## 2017-05-25 ENCOUNTER — Encounter: Payer: Self-pay | Admitting: Emergency Medicine

## 2017-05-25 ENCOUNTER — Emergency Department: Payer: Medicare Other

## 2017-05-25 ENCOUNTER — Encounter: Payer: Self-pay | Admitting: Surgery

## 2017-05-25 ENCOUNTER — Other Ambulatory Visit: Payer: Self-pay

## 2017-05-25 DIAGNOSIS — N3000 Acute cystitis without hematuria: Secondary | ICD-10-CM | POA: Diagnosis not present

## 2017-05-25 DIAGNOSIS — Q909 Down syndrome, unspecified: Secondary | ICD-10-CM | POA: Diagnosis not present

## 2017-05-25 DIAGNOSIS — F0390 Unspecified dementia without behavioral disturbance: Secondary | ICD-10-CM | POA: Diagnosis not present

## 2017-05-25 DIAGNOSIS — R404 Transient alteration of awareness: Secondary | ICD-10-CM | POA: Insufficient documentation

## 2017-05-25 DIAGNOSIS — Z79899 Other long term (current) drug therapy: Secondary | ICD-10-CM | POA: Diagnosis not present

## 2017-05-25 DIAGNOSIS — R569 Unspecified convulsions: Secondary | ICD-10-CM | POA: Diagnosis present

## 2017-05-25 LAB — COMPREHENSIVE METABOLIC PANEL
ALBUMIN: 3.1 g/dL — AB (ref 3.5–5.0)
ALK PHOS: 120 U/L (ref 38–126)
ALT: 11 U/L — AB (ref 17–63)
ANION GAP: 12 (ref 5–15)
AST: 27 U/L (ref 15–41)
BUN: 13 mg/dL (ref 6–20)
CALCIUM: 9.2 mg/dL (ref 8.9–10.3)
CHLORIDE: 99 mmol/L — AB (ref 101–111)
CO2: 27 mmol/L (ref 22–32)
Creatinine, Ser: 0.72 mg/dL (ref 0.61–1.24)
GFR calc Af Amer: >60 mL/min (ref >60)
GFR calc non Af Amer: >60 mL/min (ref >60)
GLUCOSE: 121 mg/dL — AB (ref 65–99)
Potassium: 3.6 mmol/L (ref 3.5–5.1)
SODIUM: 138 mmol/L (ref 135–145)
Total Bilirubin: 0.6 mg/dL (ref 0.3–1.2)
Total Protein: 7.1 g/dL (ref 6.5–8.1)

## 2017-05-25 LAB — CBC WITH DIFFERENTIAL/PLATELET
Basophils Absolute: 0.1 10*3/uL (ref 0–0.1)
Basophils Relative: 1 %
EOS ABS: 0.1 10*3/uL (ref 0–0.7)
EOS PCT: 2 %
HCT: 45.6 % (ref 40.0–52.0)
Hemoglobin: 15.3 g/dL (ref 13.0–18.0)
LYMPHS ABS: 1.5 10*3/uL (ref 1.0–3.6)
Lymphocytes Relative: 20 %
MCH: 34.4 pg — AB (ref 26.0–34.0)
MCHC: 33.5 g/dL (ref 32.0–36.0)
MCV: 102.9 fL — ABNORMAL HIGH (ref 80.0–100.0)
Monocytes Absolute: 0.5 10*3/uL (ref 0.2–1.0)
Monocytes Relative: 7 %
Neutro Abs: 5.3 10*3/uL (ref 1.4–6.5)
Neutrophils Relative %: 70 %
PLATELETS: 314 10*3/uL (ref 150–440)
RBC: 4.43 MIL/uL (ref 4.40–5.90)
RDW: 15.1 % — AB (ref 11.5–14.5)
WBC: 7.5 10*3/uL (ref 3.8–10.6)

## 2017-05-25 LAB — URINALYSIS, COMPLETE (UACMP) WITH MICROSCOPIC
Bilirubin Urine: NEGATIVE
GLUCOSE, UA: NEGATIVE mg/dL
Hgb urine dipstick: NEGATIVE
Ketones, ur: NEGATIVE mg/dL
Leukocytes, UA: NEGATIVE
Nitrite: NEGATIVE
PH: 8 (ref 5.0–8.0)
Protein, ur: NEGATIVE mg/dL
SPECIFIC GRAVITY, URINE: 1.008 (ref 1.005–1.030)

## 2017-05-25 LAB — ETHANOL

## 2017-05-25 MED ORDER — CEPHALEXIN 500 MG PO CAPS
500.0000 mg | ORAL_CAPSULE | Freq: Once | ORAL | Status: AC
  Administered 2017-05-25: 500 mg via ORAL
  Filled 2017-05-25: qty 1

## 2017-05-25 MED ORDER — SODIUM CHLORIDE 0.9 % IV BOLUS (SEPSIS)
500.0000 mL | Freq: Once | INTRAVENOUS | Status: AC
  Administered 2017-05-25: 500 mL via INTRAVENOUS

## 2017-05-25 MED ORDER — CEPHALEXIN 500 MG PO CAPS: 500.0000 mg | ORAL_CAPSULE | Freq: Three times a day (TID) | ORAL | 0 refills | Status: AC

## 2017-05-25 NOTE — ED Provider Notes (Signed)
[facility] Emergency Department Provider Note  ____________________________________________  Time seen: Approximately 7:26 AM  I have reviewed the triage vital signs and the nursing notes.   HISTORY  Chief Complaint Seizure like activity  Level 5 caveat:  Portions of the history and physical were unable to be obtained due to dementia, nonverbal   HPI Jesus Morton is a 62 y.o. male with a history of dementia and Down's syndrome who presents for evaluation of seizure-like activity. [facility] is a [facility] for a decubitus ulcer and a recent left flank shingles episode. Patient is currently on valtrex (day 2/5). This morning patient had an episode where he started hyperventilating, he turned red in the face, had a short-lived episode of apnea with shaking like activity. After that episode patient was immediately back to his baseline. No tongue trauma. Unknown if patient had urinary losses since he is incontinent but no bowel laws observed in his diaper. No trauma, no recent fever, nausea or vomiting or diarrhea. Patient is currently at baseline.  Past Medical History:  Diagnosis Date  . Anemia   . Dementia   . Down syndrome   . GERD (gastroesophageal reflux disease)   . Osteoporosis     Patient Active Problem List   Diagnosis Date Noted  . Decubitus ulcer of sacral region, stage 4 ([place]) 04/10/2017  . Macrocytosis 11/02/2016  . Dementia 07/19/2016  . Down syndrome 07/19/2016  . Foreign body alimentary tract 06/22/2016  . Abnormal findings on esophagogastroduodenoscopy (EGD) 06/22/2016  . Hemorrhagic esophagitis 06/22/2016  . Hypotension 06/22/2016  . Aspiration pneumonia ([place]) 06/22/2016  . Foreign body aspiration   . Cough   . Pneumonitis due to food and vomit ([place])   . Problems with swallowing and mastication   . Aspiration of foreign body   . Aspiration pneumonia of right lower lobe ([place]) 06/20/2016  . Cellulitis of right hand 12/24/2015    . Reflux esophagitis 03/10/2014  . Hematemesis 02/16/2014  . Inguinal hernia 12/18/2013  . Cellulitis of right arm 11/28/2013  . Multiple facial fractures ([place]) 03/05/2013  . GERD (gastroesophageal reflux disease) 01/28/2013  . Dysphagia 05/31/2012  . Cellulitis and abscess of leg 06/21/2011  . Corns and callosity 04/26/2011  . Onychomycosis due to dermatophyte 04/26/2011  . Osteoporosis 04/29/2010    Past Surgical History:  Procedure Laterality Date  . DEBRIDMENT OF DECUBITUS ULCER N/A 04/14/2017   Procedure: DEBRIDMENT OF DECUBITUS ULCER;  Surgeon: Florene Glen, MD;  Location: [facility] ORS;  Service: General;  Laterality: N/A;  . ESOPHAGOGASTRODUODENOSCOPY (EGD) WITH PROPOFOL N/A 06/21/2016   Procedure: ESOPHAGOGASTRODUODENOSCOPY (EGD) WITH PROPOFOL;  Surgeon: Lucilla Lame, MD;  Location: [facility] ENDOSCOPY;  Service: Endoscopy;  Laterality: N/A;  . HERNIA REPAIR      Prior to Admission medications   Medication Sig Start Date End Date Taking? Authorizing Provider  acetaminophen (TYLENOL) 500 MG tablet Take 1,000 mg by mouth 3 (three) times daily.     [provider]  artificial tears (LACRILUBE) OINT ophthalmic ointment Place into both eyes at bedtime. 0.25 inch strip to both eyes at bedtime 06/22/16   Theodoro Grist, MD  calcium carbonate (OS-CAL) 600 MG tablet Take 600 mg by mouth twice daily. 05/13/16   [provider]  cephALEXin (KEFLEX) 500 MG capsule Take 1 capsule (500 mg total) 3 (three) times daily for 7 days by mouth. 05/25/17 06/01/17  Alfred Levins, [place], MD  cholecalciferol (VITAMIN D) 1000 units tablet Take 1,000 Units by mouth daily.  [provider]  docusate (COLACE) 50 MG/5ML liquid Take 10 mLs (100 mg total) by mouth 2 (two) times daily. 04/17/17   Dustin Flock, MD  feeding supplement, ENSURE ENLIVE, (ENSURE ENLIVE) LIQD Take 237 mLs by mouth 2 (two) times daily between meals. 04/17/17   Dustin Flock, MD  hydroxypropyl methylcellulose /  hypromellose (ISOPTO TEARS / GONIOVISC) 2.5 % ophthalmic solution Place 1 drop into both eyes every 2 (two) hours while awake. 06/22/16   Theodoro Grist, MD  loratadine (CLARITIN) 10 MG tablet Take 10 mg by mouth daily as needed.     [provider]  LORazepam (ATIVAN) 0.5 MG tablet Take 1 tablet (0.5 mg total) by mouth daily as needed for anxiety. 04/17/17   Dustin Flock, MD  Melatonin 3 MG TABS Take 1 tablet by mouth daily.    [provider]  memantine (NAMENDA) 10 MG tablet Take 10 mg by mouth 2 (two) times daily.     [provider]  Multiple Vitamins-Minerals (MULTIVITAMIN ADULT) TABS Take one tablet by mouth once daily. 05/13/16   [provider]  Naftifine HCl (NAFTIN) 1 % GEL Apply 1 application topically 2 (two) times daily as needed. Between toes     [provider]  nystatin-triamcinolone (MYCOLOG II) cream Apply 1 application topically 2 (two) times daily. Affected area of elbox flexor and groin    [provider]  pantoprazole (PROTONIX) 40 MG tablet Take 1 tablet (40 mg total) by mouth daily. 06/22/16   Theodoro Grist, MD  Polyethylene Glycol 3350 (MIRALAX PO) Mix 17g in 8 oz of water and drink daily as needed for constipation. 05/13/16   [provider]  Probiotic Product (ALIGN PO) Take 1 capsule by mouth daily.     [provider]  sodium chloride (OCEAN) 0.65 % SOLN nasal spray Place 1 spray into both nostrils as needed for congestion.    [provider]  sucralfate (CARAFATE) 1 GM/10ML suspension Take 10 mLs (1 g total) by mouth 4 (four) times daily -  with meals and at bedtime. 07/19/16   Lucilla Lame, MD  vitamin B-12 (CYANOCOBALAMIN) 1000 MCG tablet Take 1,000 mcg by mouth daily.    [provider]  vitamin C (VITAMIN C) 500 MG tablet Take 1 tablet (500 mg total) by mouth daily. 04/17/17   Dustin Flock, MD  zinc sulfate 220 (50 Zn) MG capsule Take 1 capsule (220 mg total) by mouth daily.  04/17/17   Dustin Flock, MD    Allergies Patient has no known allergies.  Family History  Problem Relation Age of Onset  . Multiple myeloma Father   . Brain cancer Father   . Bone cancer Father   . Leukemia Sister   . Breast cancer Paternal Aunt   . Bladder Cancer Maternal Grandmother   . Leukemia Paternal Grandfather     Social History Social History   Tobacco Use  . Smoking status: Never Smoker  . Smokeless tobacco: Never Used  Substance Use Topics  . Alcohol use: No  . Drug use: No    Review of Systems  Constitutional: Negative for fever. Respiratory: Negative for shortness of breath. Gastrointestinal: Negative for abdominal pain, vomiting or diarrhea.  Level 5 caveat:  Portions of the history and physical were unable to be obtained due to dementia and non verbal  ____________________________________________   PHYSICAL EXAM:  VITAL SIGNS: ED Triage Vitals  Enc Vitals Group     BP 05/25/17 0716 (!) 144/62  Pulse Rate 05/25/17 0716 77     Resp 05/25/17 0716 14     Temp 05/25/17 0716 97.7 F (36.5 C)     Temp Source 05/25/17 0716 Axillary     SpO2 05/25/17 0716 100 %     Weight 05/25/17 0710 109 lb 2 oz (49.5 kg)     Height --      Head Circumference --      [facility] --      Pain Score --      Pain Loc --      Pain Edu? --      Excl. in [place]? --     Constitutional: Awake, follows commands, non verbal, no distress HEENT:      Head: Normocephalic and atraumatic.         Eyes: Conjunctivae are normal. Sclera is non-icteric. Crusty yellow discharge on the L eye      Mouth/Throat: Mucous membranes are dry.       Neck: Supple with no signs of meningismus. Cardiovascular: Regular rate and rhythm. No murmurs, gallops, or rubs. 2+ symmetrical distal pulses are present in all extremities. No JVD. Respiratory: Normal respiratory effort. Lungs are clear to auscultation bilaterally. No wheezes, crackles, or rhonchi.  Gastrointestinal: Soft, non tender, and  non distended with positive bowel sounds. No rebound or guarding.  Musculoskeletal: Nontender with normal range of motion in all extremities. No edema, cyanosis, or erythema of extremities. Neurologic: Face is symmetric. Non verbal, moves upper extremities Skin: Skin is warm, dry and intact. Dry and healing shingles in the L hip region. Decubitus ulcer with wound vac in place, looks well, dry, no discharge or erythema. No crepitus  ____________________________________________   LABS (all labs ordered are listed, but only abnormal results are displayed)  Labs Reviewed  CBC WITH DIFFERENTIAL/PLATELET - Abnormal; Notable for the following components:      Result Value   MCV 102.9 (*)    MCH 34.4 (*)    RDW 15.1 (*)    All other components within normal limits  URINALYSIS, COMPLETE (UACMP) WITH MICROSCOPIC - Abnormal; Notable for the following components:   Color, Urine YELLOW (*)    APPearance HAZY (*)    Bacteria, UA RARE (*)    Squamous Epithelial / LPF 0-5 (*)    All other components within normal limits  COMPREHENSIVE METABOLIC PANEL - Abnormal; Notable for the following components:   Chloride 99 (*)    Glucose, Bld 121 (*)    Albumin 3.1 (*)    ALT 11 (*)    All other components within normal limits  URINE CULTURE  ETHANOL   ____________________________________________  EKG  ED ECG REPORT I, Rudene Re, the attending physician, personally viewed and interpreted this ECG.  Normal sinus rhythm, normal axis, no STE or depressions, no evidence of HOCM, AV block, delta wave, ARVD, prolonged QTc, WPW, or Brugada.   ____________________________________________  RADIOLOGY  CXR: negative   Head CT: negative ____________________________________________   PROCEDURES  Procedure(s) performed: None Procedures Critical Care performed:  None ____________________________________________   INITIAL IMPRESSION / ASSESSMENT AND PLAN / ED COURSE  61 y.o. male with a  history of dementia and Down's syndrome who presents for evaluation of seizure-like activity this morning. Patient with no post-ictal phase, no prior h/o seizure. History limited by dementia, Down Syndrome and non verbal. Patient has been doing episodes where he starts panting and then takes a very big breath but no changes in skin color or apnea episodes.  Also not having any tonic-clonic movements. We'll monitor closely. In the meantime we'll check basic lab work to rule out electrolyte abnormalities. Blood glucose was normal per EMS. We do head CT to rule out intracranial pathology. We'll do a chest x-ray to rule out pneumonia. We'll do a urinalysis to rule out infection. Patient does look dry and exam we'll give gentle hydration. EKG with no evidence of dysrhythmias.   ED COURSE: Workup essentially unremarkable other than rare bacteria and his urinalysis. Since the UA was catheterized I decided to treat patient with Keflex and a urine culture is pending. Patient remained at baseline per family members who were at the bedside. Patient had an appointment with surgery clinic for evaluation of his decubitus ulcer and he was discharge back to his facility to follow-up with his afternoon appointment. Discussed return precautions with the patient's brother was at the bedside.   As part of my medical decision making, I reviewed the following data within the [place] History obtained from family, Nursing notes reviewed and incorporated, Labs reviewed , EKG interpreted , Radiograph reviewed , Notes from prior ED visits and [place] Controlled Substance Database    Pertinent labs & imaging results that were available during my care of the patient were reviewed by me and considered in my medical decision making (see chart for details).    ____________________________________________   FINAL CLINICAL IMPRESSION(S) / ED DIAGNOSES  Final diagnoses:  Transient alteration of awareness  Acute  cystitis without hematuria      NEW MEDICATIONS STARTED DURING THIS VISIT:  This SmartLink is deprecated. Use AVSMEDLIST instead to display the medication list for a patient.   Note:  This document was prepared using Dragon voice recognition software and may include unintentional dictation errors.    Rudene Re, MD 05/25/17 [phone]

## 2017-05-25 NOTE — ED Triage Notes (Signed)
Pt arrived via EMS for reports of seizure like activity. [facility] reported pt turned red, started shaking and had a brief period of hyperventilation and apnea. EMS reports NSR, CBG 168, 130/77, SpO2 94-100% RA. Pt alert on arrival, respirations even and non labored. Pt presents with brief periods of panting like breathing and then relaxes.

## 2017-05-25 NOTE — ED Notes (Signed)
Patient transported to CT 

## 2017-05-26 LAB — URINE CULTURE: Culture: NO GROWTH

## 2017-06-21 ENCOUNTER — Telehealth: Payer: Self-pay

## 2017-06-21 NOTE — Telephone Encounter (Signed)
Dr [place] DesanctisMclean Spoke with sister Albertine GratesJanet Allen and [facility]

## 2017-06-21 NOTE — Telephone Encounter (Signed)
Copied from CRM [id number]. Topic: General - Other >> Jun 19, 2017  1:55 PM Terisa Starraylor, Brittany L wrote: Jesus GratesJanet Morton sister [phone]. She states his weight has dropped to 85. She wants the Dr to give her a call. He could possibly have a plastic surgery. The wound is too deep and too wide to sow up. (lower part of his back) He has a wound vac. She thinks to do surgery could be a risk. She is wanting the Dr's opinion on this.

## 2017-07-18 ENCOUNTER — Encounter: Payer: Self-pay | Admitting: Internal Medicine

## 2017-07-18 ENCOUNTER — Ambulatory Visit (INDEPENDENT_AMBULATORY_CARE_PROVIDER_SITE_OTHER): Payer: Medicare Other | Admitting: Internal Medicine

## 2017-07-18 VITALS — BP 100/64 | HR 80 | Temp 98.1°F

## 2017-07-18 DIAGNOSIS — R0902 Hypoxemia: Secondary | ICD-10-CM | POA: Diagnosis not present

## 2017-07-18 DIAGNOSIS — Q909 Down syndrome, unspecified: Secondary | ICD-10-CM

## 2017-07-18 DIAGNOSIS — R634 Abnormal weight loss: Secondary | ICD-10-CM

## 2017-07-18 DIAGNOSIS — L89154 Pressure ulcer of sacral region, stage 4: Secondary | ICD-10-CM

## 2017-07-18 DIAGNOSIS — R5381 Other malaise: Secondary | ICD-10-CM

## 2017-07-18 DIAGNOSIS — G40909 Epilepsy, unspecified, not intractable, without status epilepticus: Secondary | ICD-10-CM

## 2017-07-18 DIAGNOSIS — R627 Adult failure to thrive: Secondary | ICD-10-CM

## 2017-07-18 DIAGNOSIS — F0390 Unspecified dementia without behavioral disturbance: Secondary | ICD-10-CM

## 2017-07-18 NOTE — Patient Instructions (Addendum)
Please follow up in 2-3 weeks sooner if needed  Consider Keppra to prevent seizures     Failure to Thrive, Adult Failure to thrive is a group of problems. These problems include eating too little and losing weight. People who have this condition may do fewer and fewer activities over time. They may lose interest in being with friends or they may not want to eat or drink. Follow these instructions at home:  Take medicines only as told by your doctor.  Eat a healthy, well-balanced diet. Make sure that you eat enough.  Be active. Do strength training. A physical therapist can help to set up an exercise program that fits you.  Make sure that you are safe at home.  Make sure that you have a plan for what to do if you cannot make decisions for yourself. Contact a doctor if:  You are not able to eat well.  You are not able to move around.  You feel very sad.  You feel very hopeless. Get help right away if:  You think about ending your life.  You cannot eat or drink.  You do not get out of bed.  Staying at home is not safe.  You have a fever. This information is not intended to replace advice given to you by your health care provider. Make sure you discuss any questions you have with your health care provider. Document Released: 06/16/2011 Document Revised: 12/03/2015 Document Reviewed: 09/22/2014 Elsevier Interactive Patient Education  [employer]2018 [employer]

## 2017-07-23 ENCOUNTER — Encounter: Payer: Self-pay | Admitting: Internal Medicine

## 2017-07-23 DIAGNOSIS — R0902 Hypoxemia: Secondary | ICD-10-CM | POA: Insufficient documentation

## 2017-07-23 DIAGNOSIS — G40909 Epilepsy, unspecified, not intractable, without status epilepticus: Secondary | ICD-10-CM | POA: Insufficient documentation

## 2017-07-23 DIAGNOSIS — R634 Abnormal weight loss: Secondary | ICD-10-CM | POA: Insufficient documentation

## 2017-07-23 DIAGNOSIS — R5381 Other malaise: Secondary | ICD-10-CM | POA: Insufficient documentation

## 2017-07-23 DIAGNOSIS — R627 Adult failure to thrive: Secondary | ICD-10-CM | POA: Insufficient documentation

## 2017-07-23 NOTE — Progress Notes (Signed)
Chief Complaint  Patient presents with  . Establish Care   Establish care brought in by sister Rolly Salter former pt at [facility] Gayland Curry. Pt has downs syndrome and history obtained via sister.  1. Abnormal wt loss since 02/2017 having seizures, abnormal wt loss was weighing in 1 teens #s but now down to low 80s though wt recently increased a few pounds per sister to 85 lbs. He has a pressure ulcer being followed [employer] with wound vac and also living at [facility] instead of [facility]. Health has overall declined recently. Spoke with Dr. Lovie Macadamia today physician at [facility] who stated he just did labs on pt 07/01/17 and 07/13/16 reviewed labs Cr normal, Na 148, K 4.5, CO2 33, glucose 90, BUN/Cr 14/0.55. AST/ALT normal AP 111 slightly elevated per 104 cut off, TB normal, TP low 5.7 lower limit normal 5.7, Alb 2.8 on 07/01/17, vit B12 >2000, CBC WBC 6.6. H/H 15.0/45.6 plts 298, TPO abx 8 wnl 07/01/17 and labs 07/13/17, Na 142, K 4.7, CO2 35 elevated, glucose 82, BUN/Cr 18/0.54 Ca 8.33 low AST, ALT normal, AP normal, TB normal, TP low 5.2, Alb 2.5 low, CBC WBC 7.5/13.1/40.2 (low)plts 326, ESR 52 elevated Mag 2.0 normal, TSH 2.43 normal. Dr. Lovie Macadamia reports he has had disc with family re hospice care but family still wants all care for now.   2. H/o seizures [facility] neurology rec trial of keppra but family c/w side effects disc consider trial keppra with sister today. Sister is concerned b/c pt already not as active as he used to and lies in bed all day and has increased sleep.  3. Pressure wound to lower back unstageable with wound vac on per sister and Public relations account executive and wound RN doing well with wound vac in place  4. Pt is on O2 today.  Unable to get O2 sat hands too cold pt does not appear in respiratory distress with O2 via nasal cannula.   5. Physical deconditioning insurance will not cover any more PT/OT at [facility] per sister pt unable   Review of Systems  Constitutional: Positive for malaise/fatigue and  weight loss.  HENT: Negative for hearing loss.   Respiratory: Negative for shortness of breath.   Cardiovascular: Negative for chest pain.  Gastrointestinal: Negative for abdominal pain.  Musculoskeletal:       +physical deconditioning   Skin:       +pressure wound   Neurological:       +seizures  +physical deconditioning   Psychiatric/Behavioral:       +down syndrome   Past Medical History:  Diagnosis Date  . Anemia   . Dementia   . Down syndrome   . GERD (gastroesophageal reflux disease)   . Osteoporosis    Past Surgical History:  Procedure Laterality Date  . DEBRIDMENT OF DECUBITUS ULCER N/A 04/14/2017   Procedure: DEBRIDMENT OF DECUBITUS ULCER;  Surgeon: Florene Glen, MD;  Location: [facility] ORS;  Service: General;  Laterality: N/A;  . ESOPHAGOGASTRODUODENOSCOPY (EGD) WITH PROPOFOL N/A 06/21/2016   Procedure: ESOPHAGOGASTRODUODENOSCOPY (EGD) WITH PROPOFOL;  Surgeon: Lucilla Lame, MD;  Location: [facility] ENDOSCOPY;  Service: Endoscopy;  Laterality: N/A;  . HERNIA REPAIR     Family History  Problem Relation Age of Onset  . Multiple myeloma Father   . Brain cancer Father   . Bone cancer Father   . Leukemia Sister   . Breast cancer Paternal Aunt   . Bladder Cancer Maternal Grandmother   . Leukemia Paternal Grandfather  Social History   Socioeconomic History  . Marital status: Single    Spouse name: Not on file  . Number of children: Not on file  . Years of education: Not on file  . Highest education level: Not on file  Social Needs  . Financial resource strain: Not on file  . Food insecurity - worry: Not on file  . Food insecurity - inability: Not on file  . Transportation needs - medical: Not on file  . Transportation needs - non-medical: Not on file  Occupational History  . Not on file  Tobacco Use  . Smoking status: Never Smoker  . Smokeless tobacco: Never Used  Substance and Sexual Activity  . Alcohol use: No  . Drug use: No  . Sexual activity: No   Other Topics Concern  . Not on file  Social History Narrative  . Not on file   No outpatient medications have been marked as taking for the 07/18/17 encounter (Office Visit) with McLean-Scocuzza, Nino Glow, MD.   No Known Allergies Recent Results (from the past 2160 hour(s))  CBC with Differential/Platelet     Status: Abnormal   Collection Time: 05/25/17  7:15 AM  Result Value Ref Range   WBC 7.5 3.8 - 10.6 K/uL   RBC 4.43 4.40 - 5.90 MIL/uL   Hemoglobin 15.3 13.0 - 18.0 g/dL   HCT 45.6 40.0 - 52.0 %   MCV 102.9 (H) 80.0 - 100.0 fL   MCH 34.4 (H) 26.0 - 34.0 pg   MCHC 33.5 32.0 - 36.0 g/dL   RDW 15.1 (H) 11.5 - 14.5 %   Platelets 314 150 - 440 K/uL   Neutrophils Relative % 70 %   Neutro Abs 5.3 1.4 - 6.5 K/uL   Lymphocytes Relative 20 %   Lymphs Abs 1.5 1.0 - 3.6 K/uL   Monocytes Relative 7 %   Monocytes Absolute 0.5 0.2 - 1.0 K/uL   Eosinophils Relative 2 %   Eosinophils Absolute 0.1 0 - 0.7 K/uL   Basophils Relative 1 %   Basophils Absolute 0.1 0 - 0.1 K/uL  Urinalysis, Complete w Microscopic     Status: Abnormal   Collection Time: 05/25/17  7:20 AM  Result Value Ref Range   Color, Urine YELLOW (A) YELLOW   APPearance HAZY (A) CLEAR   Specific Gravity, Urine 1.008 1.005 - 1.030   pH 8.0 5.0 - 8.0   Glucose, UA NEGATIVE NEGATIVE mg/dL   Hgb urine dipstick NEGATIVE NEGATIVE   Bilirubin Urine NEGATIVE NEGATIVE   Ketones, ur NEGATIVE NEGATIVE mg/dL   Protein, ur NEGATIVE NEGATIVE mg/dL   Nitrite NEGATIVE NEGATIVE   Leukocytes, UA NEGATIVE NEGATIVE   RBC / HPF 0-5 0 - 5 RBC/hpf   WBC, UA 0-5 0 - 5 WBC/hpf   Bacteria, UA RARE (A) NONE SEEN   Squamous Epithelial / LPF 0-5 (A) NONE SEEN   Amorphous Crystal PRESENT   Ethanol     Status: None   Collection Time: 05/25/17  7:20 AM  Result Value Ref Range   Alcohol, Ethyl (B) <10 <10 mg/dL    Comment:        LOWEST DETECTABLE LIMIT FOR SERUM ALCOHOL IS 10 mg/dL FOR MEDICAL PURPOSES ONLY   Comprehensive metabolic panel      Status: Abnormal   Collection Time: 05/25/17  7:20 AM  Result Value Ref Range   Sodium 138 135 - 145 mmol/L   Potassium 3.6 3.5 - 5.1 mmol/L   Chloride 99 (L) 101 -  111 mmol/L   CO2 27 22 - 32 mmol/L   Glucose, Bld 121 (H) 65 - 99 mg/dL   BUN 13 6 - 20 mg/dL   Creatinine, Ser 0.72 0.61 - 1.24 mg/dL   Calcium 9.2 8.9 - 10.3 mg/dL   Total Protein 7.1 6.5 - 8.1 g/dL   Albumin 3.1 (L) 3.5 - 5.0 g/dL   AST 27 15 - 41 U/L   ALT 11 (L) 17 - 63 U/L   Alkaline Phosphatase 120 38 - 126 U/L   Total Bilirubin 0.6 0.3 - 1.2 mg/dL   GFR calc non Af Amer >60 >60 mL/min   GFR calc Af Amer >60 >60 mL/min    Comment: (NOTE) The eGFR has been calculated using the CKD EPI equation. This calculation has not been validated in all clinical situations. eGFR's persistently <60 mL/min signify possible Chronic Kidney Disease.    Anion gap 12 5 - 15  Urine Culture     Status: None   Collection Time: 05/25/17  7:20 AM  Result Value Ref Range   Specimen Description URINE, RANDOM    Special Requests NONE    Culture      NO GROWTH Performed at [facility], [place] [address], [place], [place] [address]    Report Status 05/26/2017 FINAL    Objective  There is no height or weight on file to calculate BMI. Wt Readings from Last 3 Encounters:  05/25/17 109 lb 2 oz (49.5 kg)  04/17/17 109 lb (49.4 kg)  03/27/17 96 lb (43.5 kg)   Temp Readings from Last 3 Encounters:  07/18/17 98.1 F (36.7 C) (Oral)  05/25/17 97.7 F (36.5 C) (Axillary)  04/17/17 98.5 F (36.9 C) (Oral)   BP Readings from Last 3 Encounters:  07/18/17 100/64  05/25/17 (!) 124/55  04/17/17 125/62   Pulse Readings from Last 3 Encounters:  07/18/17 80  05/25/17 62  04/17/17 70   Unable to obtain O2 sat and weight today   Physical Exam  Constitutional: Vital signs are normal. He appears malnourished. He appears cachectic.  VS can obtaine normal unable to do wt, O2 sat on O2 nasal cannula today  +cachetic   HENT:  Head:  Normocephalic and atraumatic.  Mouth/Throat: Mucous membranes are dry.  Tongue very dry today appears dehydrated   Eyes: Conjunctivae are normal. Pupils are equal, round, and reactive to light.  Cardiovascular: Normal rate and regular rhythm.  Murmur heard. Pulmonary/Chest:  Pt unable to follow instructions for respirations unable to obtained good lung exam today  Neurological: He is alert.  In wheelchair today   Skin:  +unable to see pressure wound today due to wound vac  No wounds to legs skin to legs very dry  Psychiatric: Mood and affect normal. He exhibits abnormal remote memory.  Does not remember sisters name today who he normally knows this is new per sister   Nursing note and vitals reviewed.   Assessment   1. Abnormal weight loss/failure to thrive and appears dehydrated  2. Seizures ? Etiology  3. Physical deconditioning now wheelchair and bed bound  4. unstageable pressure wound to lower back 5. H/o hypoxia with Nasal cannula today on O2 tx  6. HM  7. Down syndrome with +murmur and dementia worsening  Plan  1.  Spoke with Dr. [facility]ie [facility]adamia at [facility] re pt and care see HPI [phone]  rec encouraged to drink and eat and use sponges for hydration as well as straw He needs assistance  from [facility] staff to eat and encouragement  Offer Prostat/Ensure of Boost 3-4 x per day and ensure pt drinks he needs aspiration precautions he is on puree diet  Reviewed labs from [facility] 12/22 and 07/13/17 will not do labs today  2. Family considering Keppra Will need to f/u neurology at [facility]  3. Insurance will not cover further PT/OT for now  4. Wound care with Wound RN will need to also see [facility] with wound care recs given he has wound vac intact  5. Cont Nasal saline prn  Check O2 sat at [facility] at least daily  6. 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 Cont meds for dementia needs help with all ADLs and IADLS (I.e eating/drinking 3-4x perday, bathing, clothing)  CC note to [facility] fax [phone]    Provider: Dr. Olivia Mackie McLean-Scocuzza-Internal Medicine

## 2017-07-24 ENCOUNTER — Telehealth: Payer: Self-pay

## 2017-07-24 NOTE — Telephone Encounter (Signed)
[facility]

## 2017-07-24 NOTE — Telephone Encounter (Signed)
-----   Message from Bevelyn Bucklesracy N McLean-Scocuzza, MD sent at 07/23/2017  2:38 PM EST ----- Please fax note to [facility]   CC note to [facility] fax [phone] attn NICOLE BLACKWELL   Thanks TMS

## 2017-08-07 ENCOUNTER — Ambulatory Visit: Payer: Self-pay | Admitting: Internal Medicine

## 2017-08-28 ENCOUNTER — Other Ambulatory Visit: Payer: Self-pay | Admitting: Internal Medicine

## 2017-08-28 NOTE — Progress Notes (Signed)
Spoke with sister Albertine GratesJanet Allen pt transferred to hospice today  She wishes I still be involved in care as PCP  TMS

## 2017-08-31 ENCOUNTER — Ambulatory Visit: Payer: Self-pay | Admitting: Internal Medicine

## 2017-10-09 DEATH — deceased

## 2018-04-25 IMAGING — CT CT HEAD W/O CM
3 of 4 series · 16 of 47 positions shown, 19 images · non-contrast
Comparison: 12/27/2016

CLINICAL DATA: Seizure, new, nontraumatic.

EXAM:
CT HEAD WITHOUT CONTRAST
TECHNIQUE: Contiguous axial images were obtained from the base of the skull
through the vertex without intravenous contrast.

[Series 2: head wo · axial · 0.39mm/px · z∈[-179,-54]mm · 10 of 31 slices shown, 13 images]
[im 3/31  brain]
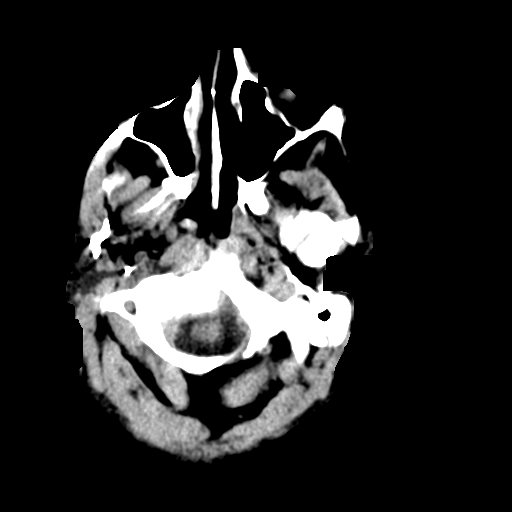
[im 3/31  bone]
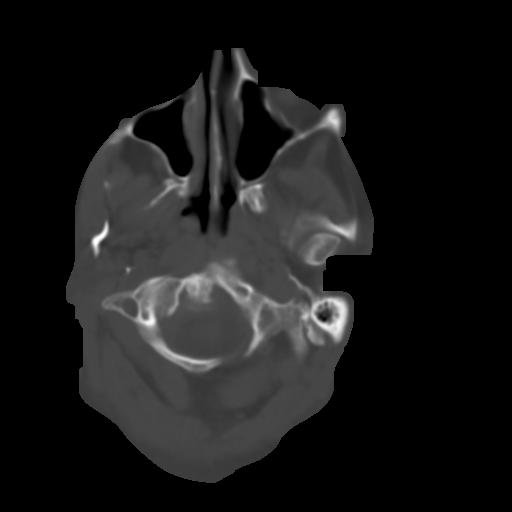
[im 5/31  brain]
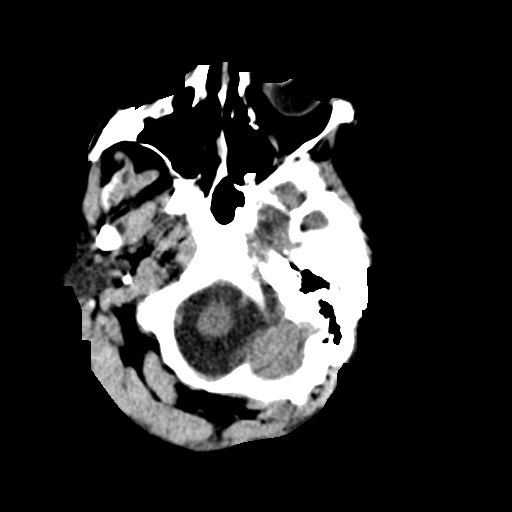
[im 9/31  brain]
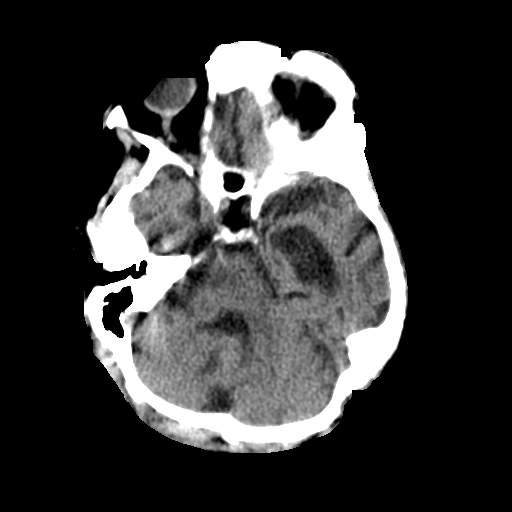
[im 11/31  brain]
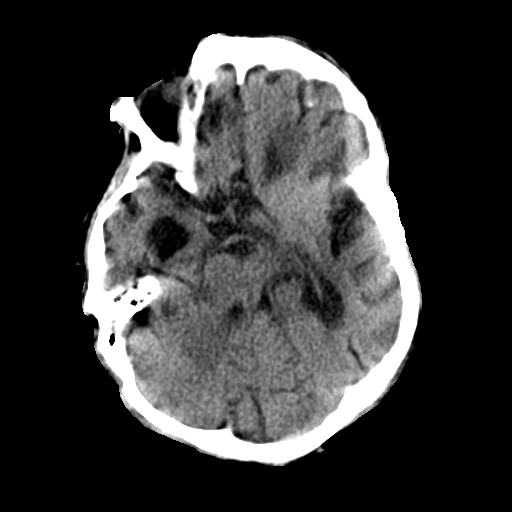
[im 13/31  brain]
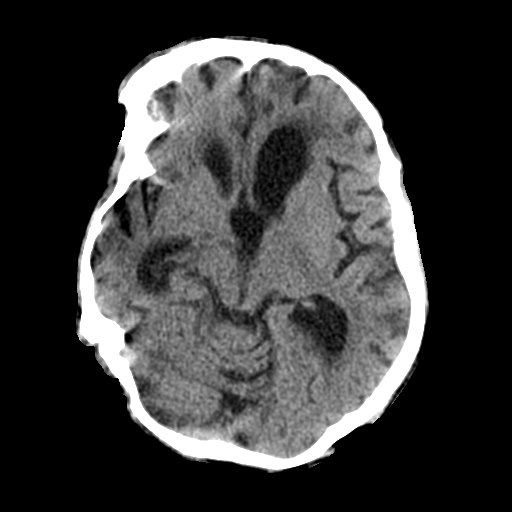
[im 13/31  bone]
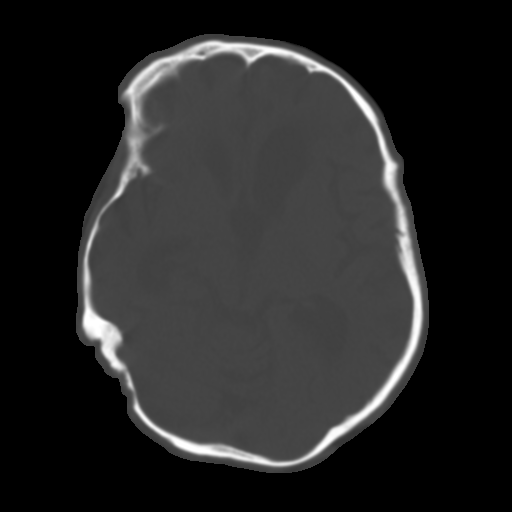
[im 18/31  brain]
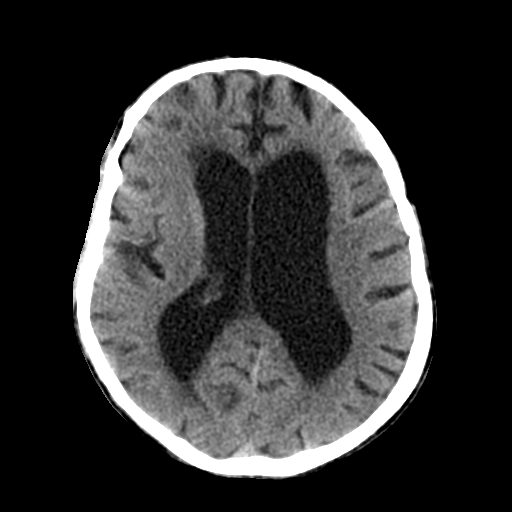
[im 20/31  brain]
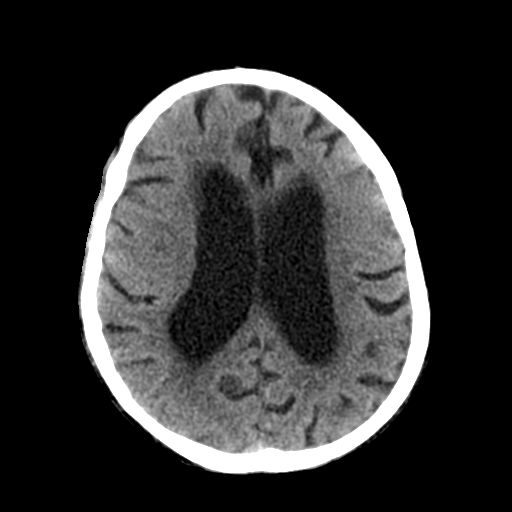
[im 22/31  brain]
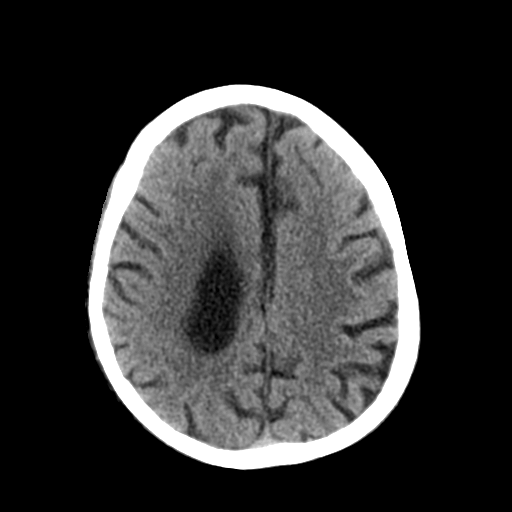
[im 26/31  brain]
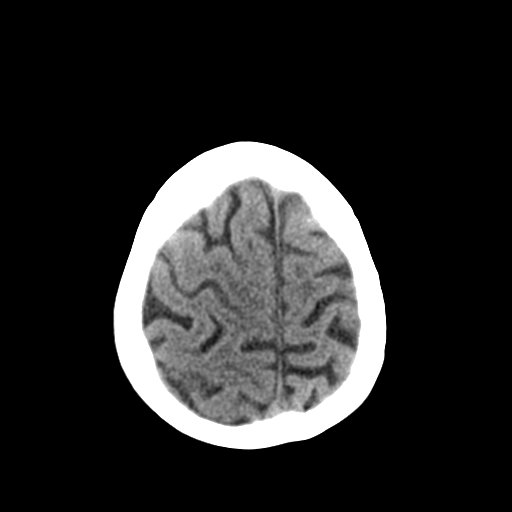
[im 26/31  bone]
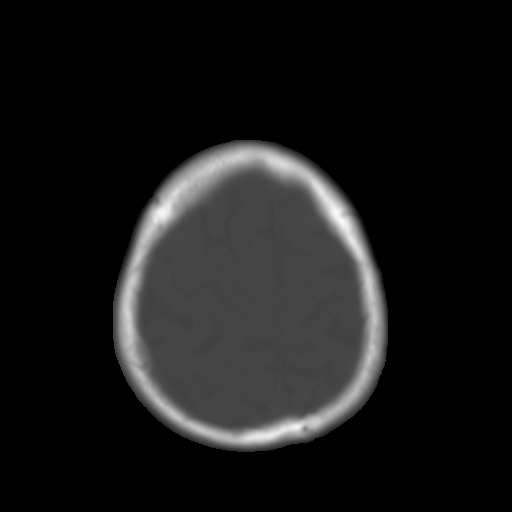
[im 28/31  brain]
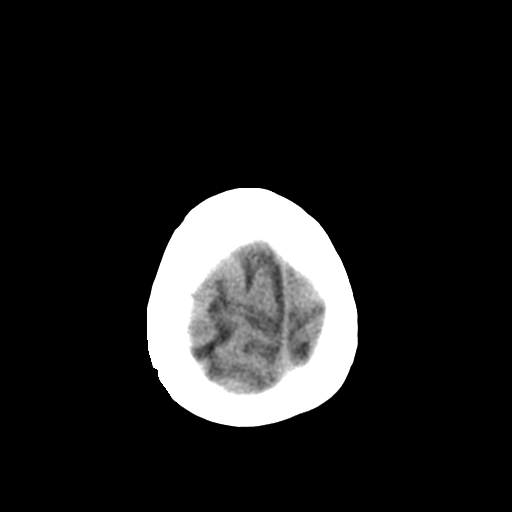

[Series 4: coronal soft tissue · coronal · 0.33mm/px · 3 of 60 slices shown]
[im 20/60  brain]
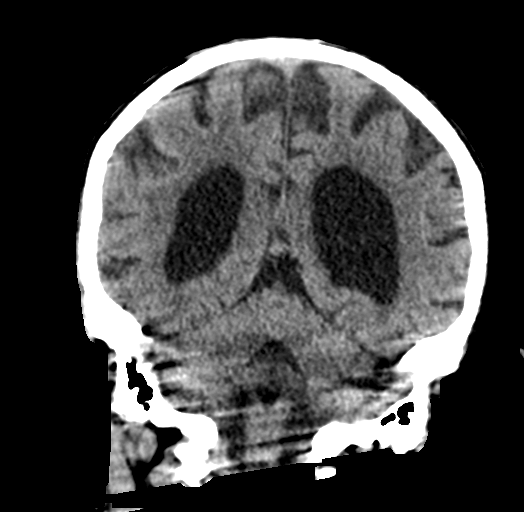
[im 27/60  brain]
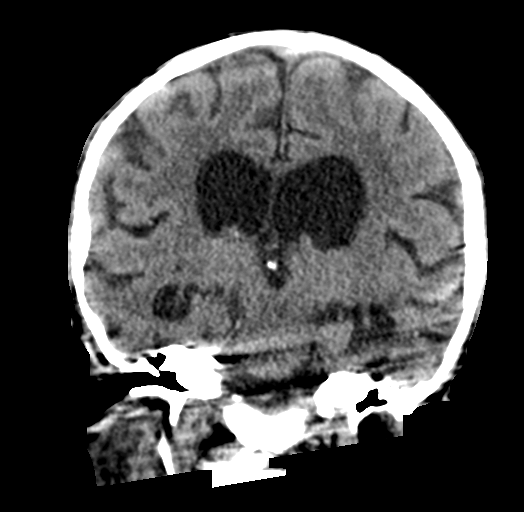
[im 33/60  brain]
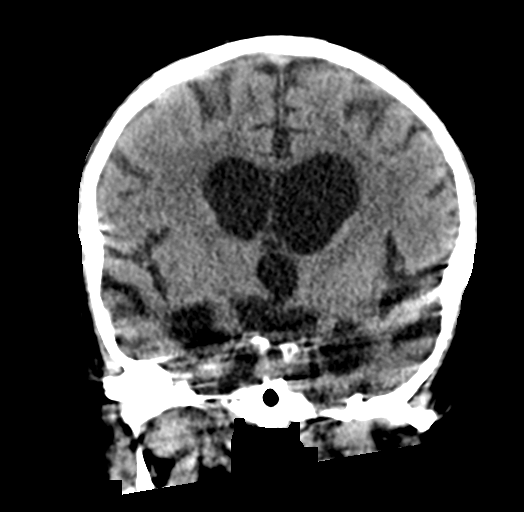

[Series 5: sagittal soft tissue · sagittal · 0.34mm/px · 3 of 49 slices shown]
[im 18/49  brain]
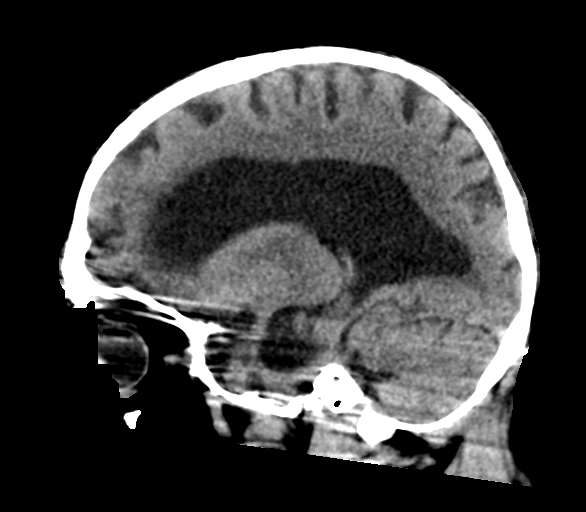
[im 25/49  brain]
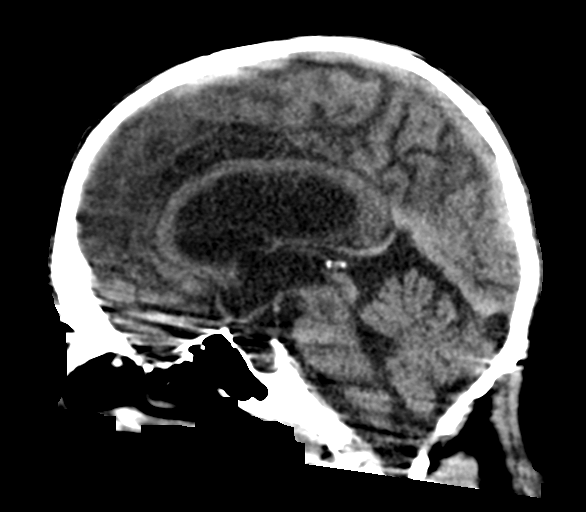
[im 32/49  brain]
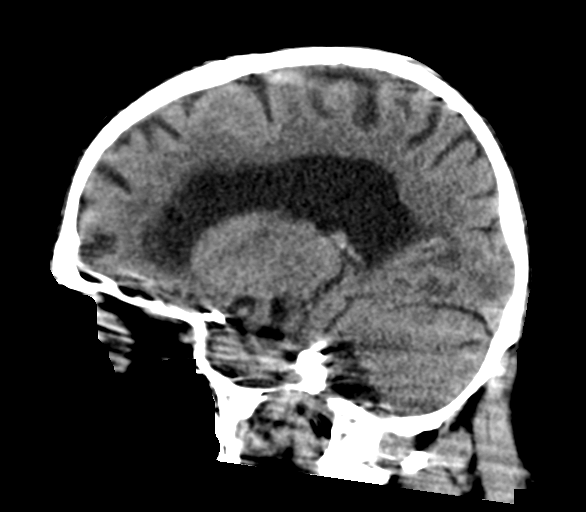

[16 of 47 positions shown; findings below may reference images not displayed]

FINDINGS: Brain: No evidence of acute infarction, hemorrhage, hydrocephalus,
extra-axial collection or mass lesion/mass effect. There is advanced
atrophy with ventriculomegaly. Medial temporal volume loss is
especially severe in this patient with history of dementia, an
Alzheimer's pattern. Right inferior frontal gliosis, usually
posttraumatic in this location.

Vascular: Atherosclerotic calcification.  No hyperdense vessel.

Skull: No acute or aggressive finding. There is advanced C1-2 facet
arthropathy with cranial settling.

Sinuses/Orbits: Dysconjugate gaze, nonspecific.
IMPRESSION: 1. No acute finding.
2. Advanced atrophy in keeping with history of dementia.
